# Patient Record
Sex: Male | Born: 1935 | Race: White | Hispanic: No | Marital: Married | Smoking: Never smoker
Health system: Southern US, Community
[De-identification: ages and names within clinical notes are randomized; demographics above are authoritative.]

## PROBLEM LIST (undated history)

## (undated) DIAGNOSIS — R269 Unspecified abnormalities of gait and mobility: Secondary | ICD-10-CM

## (undated) DIAGNOSIS — R413 Other amnesia: Secondary | ICD-10-CM

## (undated) DIAGNOSIS — Z9181 History of falling: Secondary | ICD-10-CM

## (undated) DIAGNOSIS — M810 Age-related osteoporosis without current pathological fracture: Secondary | ICD-10-CM

## (undated) DIAGNOSIS — F32A Depression, unspecified: Secondary | ICD-10-CM

## (undated) DIAGNOSIS — I10 Essential (primary) hypertension: Secondary | ICD-10-CM

## (undated) DIAGNOSIS — R42 Dizziness and giddiness: Secondary | ICD-10-CM

## (undated) DIAGNOSIS — G20 Parkinson's disease: Secondary | ICD-10-CM

## (undated) DIAGNOSIS — S0083XA Contusion of other part of head, initial encounter: Secondary | ICD-10-CM

## (undated) DIAGNOSIS — K5909 Other constipation: Secondary | ICD-10-CM

## (undated) DIAGNOSIS — I951 Orthostatic hypotension: Secondary | ICD-10-CM

## (undated) DIAGNOSIS — G20A1 Parkinson's disease without dyskinesia, without mention of fluctuations: Secondary | ICD-10-CM

## (undated) DIAGNOSIS — S2239XA Fracture of one rib, unspecified side, initial encounter for closed fracture: Secondary | ICD-10-CM

## (undated) DIAGNOSIS — F329 Major depressive disorder, single episode, unspecified: Secondary | ICD-10-CM

## (undated) DIAGNOSIS — N400 Enlarged prostate without lower urinary tract symptoms: Secondary | ICD-10-CM

## (undated) HISTORY — DX: Major depressive disorder, single episode, unspecified: F32.9

## (undated) HISTORY — DX: Other amnesia: R41.3

## (undated) HISTORY — DX: Unspecified abnormalities of gait and mobility: R26.9

## (undated) HISTORY — DX: Orthostatic hypotension: I95.1

## (undated) HISTORY — DX: Essential (primary) hypertension: I10

## (undated) HISTORY — PX: HERNIA REPAIR: SHX51

## (undated) HISTORY — DX: Other constipation: K59.09

## (undated) HISTORY — DX: Benign prostatic hyperplasia without lower urinary tract symptoms: N40.0

## (undated) HISTORY — DX: Depression, unspecified: F32.A

## (undated) HISTORY — DX: Fracture of one rib, unspecified side, initial encounter for closed fracture: S22.39XA

## (undated) HISTORY — PX: CATARACT EXTRACTION: SUR2

## (undated) HISTORY — PX: APPENDECTOMY: SHX54

## (undated) HISTORY — PX: TONSILLECTOMY: SUR1361

## (undated) HISTORY — DX: History of falling: Z91.81

## (undated) HISTORY — DX: Contusion of other part of head, initial encounter: S00.83XA

---

## 1999-02-23 ENCOUNTER — Encounter: Payer: Self-pay | Admitting: Otolaryngology

## 1999-02-23 ENCOUNTER — Encounter: Admission: RE | Admit: 1999-02-23 | Discharge: 1999-02-23 | Payer: Self-pay | Admitting: Otolaryngology

## 2000-10-23 ENCOUNTER — Ambulatory Visit (HOSPITAL_COMMUNITY): Admission: RE | Admit: 2000-10-23 | Discharge: 2000-10-23 | Payer: Self-pay | Admitting: Gastroenterology

## 2007-06-25 ENCOUNTER — Encounter: Admission: RE | Admit: 2007-06-25 | Discharge: 2007-07-19 | Payer: Self-pay | Admitting: Neurology

## 2010-02-01 ENCOUNTER — Encounter
Admission: RE | Admit: 2010-02-01 | Discharge: 2010-02-08 | Payer: Self-pay | Source: Home / Self Care | Attending: Neurology | Admitting: Neurology

## 2010-02-10 ENCOUNTER — Ambulatory Visit: Payer: Self-pay | Admitting: Physical Therapy

## 2010-02-11 ENCOUNTER — Ambulatory Visit: Payer: Medicare Other | Attending: Neurology | Admitting: Physical Therapy

## 2010-02-11 DIAGNOSIS — R269 Unspecified abnormalities of gait and mobility: Secondary | ICD-10-CM | POA: Insufficient documentation

## 2010-02-11 DIAGNOSIS — G20A1 Parkinson's disease without dyskinesia, without mention of fluctuations: Secondary | ICD-10-CM | POA: Insufficient documentation

## 2010-02-11 DIAGNOSIS — IMO0001 Reserved for inherently not codable concepts without codable children: Secondary | ICD-10-CM | POA: Insufficient documentation

## 2010-02-11 DIAGNOSIS — G20 Parkinson's disease: Secondary | ICD-10-CM | POA: Insufficient documentation

## 2010-02-14 ENCOUNTER — Ambulatory Visit: Payer: Medicare Other | Admitting: Physical Therapy

## 2010-02-16 ENCOUNTER — Ambulatory Visit: Payer: Medicare Other | Admitting: Physical Therapy

## 2010-02-18 ENCOUNTER — Ambulatory Visit: Payer: Medicare Other | Admitting: Physical Therapy

## 2010-02-22 ENCOUNTER — Ambulatory Visit: Payer: Medicare Other | Admitting: Physical Therapy

## 2010-02-23 ENCOUNTER — Ambulatory Visit: Payer: Medicare Other | Admitting: Physical Therapy

## 2010-02-24 ENCOUNTER — Ambulatory Visit: Payer: Self-pay | Admitting: Physical Therapy

## 2010-02-25 ENCOUNTER — Ambulatory Visit: Payer: Medicare Other | Admitting: Physical Therapy

## 2010-03-01 ENCOUNTER — Ambulatory Visit: Payer: Medicare Other | Admitting: Physical Therapy

## 2010-03-03 ENCOUNTER — Ambulatory Visit: Payer: Medicare Other | Admitting: Physical Therapy

## 2010-03-04 ENCOUNTER — Ambulatory Visit: Payer: Self-pay | Admitting: Physical Therapy

## 2010-03-07 ENCOUNTER — Ambulatory Visit: Payer: Medicare Other | Admitting: Physical Therapy

## 2010-03-10 ENCOUNTER — Ambulatory Visit: Payer: BC Managed Care – PPO | Admitting: Physical Therapy

## 2010-03-11 ENCOUNTER — Ambulatory Visit: Payer: BC Managed Care – PPO | Admitting: Physical Therapy

## 2010-03-15 ENCOUNTER — Ambulatory Visit: Payer: Medicare Other | Attending: Neurology | Admitting: Physical Therapy

## 2010-03-15 DIAGNOSIS — R269 Unspecified abnormalities of gait and mobility: Secondary | ICD-10-CM | POA: Insufficient documentation

## 2010-03-15 DIAGNOSIS — G20A1 Parkinson's disease without dyskinesia, without mention of fluctuations: Secondary | ICD-10-CM | POA: Insufficient documentation

## 2010-03-15 DIAGNOSIS — IMO0001 Reserved for inherently not codable concepts without codable children: Secondary | ICD-10-CM | POA: Insufficient documentation

## 2010-03-15 DIAGNOSIS — G20 Parkinson's disease: Secondary | ICD-10-CM | POA: Insufficient documentation

## 2010-03-17 ENCOUNTER — Ambulatory Visit: Payer: Medicare Other | Admitting: Physical Therapy

## 2010-03-23 ENCOUNTER — Ambulatory Visit: Payer: Medicare Other | Admitting: Physical Therapy

## 2010-03-25 ENCOUNTER — Ambulatory Visit: Payer: Medicare Other | Admitting: Physical Therapy

## 2010-03-29 ENCOUNTER — Ambulatory Visit: Payer: Medicare Other | Admitting: Physical Therapy

## 2010-03-31 ENCOUNTER — Ambulatory Visit: Payer: BC Managed Care – PPO | Admitting: Physical Therapy

## 2010-04-01 ENCOUNTER — Ambulatory Visit: Payer: Medicare Other | Admitting: Physical Therapy

## 2010-04-05 ENCOUNTER — Ambulatory Visit: Payer: BC Managed Care – PPO | Admitting: Physical Therapy

## 2010-04-07 ENCOUNTER — Ambulatory Visit: Payer: BC Managed Care – PPO | Admitting: Physical Therapy

## 2011-01-19 DIAGNOSIS — J309 Allergic rhinitis, unspecified: Secondary | ICD-10-CM | POA: Diagnosis not present

## 2011-01-26 DIAGNOSIS — J309 Allergic rhinitis, unspecified: Secondary | ICD-10-CM | POA: Diagnosis not present

## 2011-01-30 DIAGNOSIS — J309 Allergic rhinitis, unspecified: Secondary | ICD-10-CM | POA: Diagnosis not present

## 2011-02-02 DIAGNOSIS — R269 Unspecified abnormalities of gait and mobility: Secondary | ICD-10-CM | POA: Diagnosis not present

## 2011-02-02 DIAGNOSIS — I951 Orthostatic hypotension: Secondary | ICD-10-CM | POA: Diagnosis not present

## 2011-02-02 DIAGNOSIS — R42 Dizziness and giddiness: Secondary | ICD-10-CM | POA: Diagnosis not present

## 2011-02-02 DIAGNOSIS — G20 Parkinson's disease: Secondary | ICD-10-CM | POA: Diagnosis not present

## 2011-02-16 DIAGNOSIS — H259 Unspecified age-related cataract: Secondary | ICD-10-CM | POA: Diagnosis not present

## 2011-02-23 DIAGNOSIS — M542 Cervicalgia: Secondary | ICD-10-CM | POA: Diagnosis not present

## 2011-02-23 DIAGNOSIS — M9981 Other biomechanical lesions of cervical region: Secondary | ICD-10-CM | POA: Diagnosis not present

## 2011-02-23 DIAGNOSIS — M5030 Other cervical disc degeneration, unspecified cervical region: Secondary | ICD-10-CM | POA: Diagnosis not present

## 2011-02-23 DIAGNOSIS — M62838 Other muscle spasm: Secondary | ICD-10-CM | POA: Diagnosis not present

## 2011-02-24 DIAGNOSIS — J309 Allergic rhinitis, unspecified: Secondary | ICD-10-CM | POA: Diagnosis not present

## 2011-03-03 DIAGNOSIS — J309 Allergic rhinitis, unspecified: Secondary | ICD-10-CM | POA: Diagnosis not present

## 2011-03-06 DIAGNOSIS — J309 Allergic rhinitis, unspecified: Secondary | ICD-10-CM | POA: Diagnosis not present

## 2011-03-15 DIAGNOSIS — J309 Allergic rhinitis, unspecified: Secondary | ICD-10-CM | POA: Diagnosis not present

## 2011-03-27 DIAGNOSIS — N401 Enlarged prostate with lower urinary tract symptoms: Secondary | ICD-10-CM | POA: Diagnosis not present

## 2011-03-27 DIAGNOSIS — R351 Nocturia: Secondary | ICD-10-CM | POA: Diagnosis not present

## 2011-03-27 DIAGNOSIS — N529 Male erectile dysfunction, unspecified: Secondary | ICD-10-CM | POA: Diagnosis not present

## 2011-03-31 DIAGNOSIS — J309 Allergic rhinitis, unspecified: Secondary | ICD-10-CM | POA: Diagnosis not present

## 2011-04-06 DIAGNOSIS — M5030 Other cervical disc degeneration, unspecified cervical region: Secondary | ICD-10-CM | POA: Diagnosis not present

## 2011-04-06 DIAGNOSIS — M542 Cervicalgia: Secondary | ICD-10-CM | POA: Diagnosis not present

## 2011-04-06 DIAGNOSIS — M62838 Other muscle spasm: Secondary | ICD-10-CM | POA: Diagnosis not present

## 2011-04-06 DIAGNOSIS — M9981 Other biomechanical lesions of cervical region: Secondary | ICD-10-CM | POA: Diagnosis not present

## 2011-04-13 DIAGNOSIS — J309 Allergic rhinitis, unspecified: Secondary | ICD-10-CM | POA: Diagnosis not present

## 2011-04-20 DIAGNOSIS — M779 Enthesopathy, unspecified: Secondary | ICD-10-CM | POA: Diagnosis not present

## 2011-04-20 DIAGNOSIS — M216X9 Other acquired deformities of unspecified foot: Secondary | ICD-10-CM | POA: Diagnosis not present

## 2011-04-20 DIAGNOSIS — J309 Allergic rhinitis, unspecified: Secondary | ICD-10-CM | POA: Diagnosis not present

## 2011-04-28 DIAGNOSIS — J309 Allergic rhinitis, unspecified: Secondary | ICD-10-CM | POA: Diagnosis not present

## 2011-04-28 DIAGNOSIS — J301 Allergic rhinitis due to pollen: Secondary | ICD-10-CM | POA: Diagnosis not present

## 2011-04-28 DIAGNOSIS — J45909 Unspecified asthma, uncomplicated: Secondary | ICD-10-CM | POA: Diagnosis not present

## 2011-04-28 DIAGNOSIS — J3089 Other allergic rhinitis: Secondary | ICD-10-CM | POA: Diagnosis not present

## 2011-05-02 DIAGNOSIS — R269 Unspecified abnormalities of gait and mobility: Secondary | ICD-10-CM | POA: Diagnosis not present

## 2011-05-02 DIAGNOSIS — G20 Parkinson's disease: Secondary | ICD-10-CM | POA: Diagnosis not present

## 2011-05-03 DIAGNOSIS — N401 Enlarged prostate with lower urinary tract symptoms: Secondary | ICD-10-CM | POA: Diagnosis not present

## 2011-05-03 DIAGNOSIS — R351 Nocturia: Secondary | ICD-10-CM | POA: Diagnosis not present

## 2011-05-05 DIAGNOSIS — J309 Allergic rhinitis, unspecified: Secondary | ICD-10-CM | POA: Diagnosis not present

## 2011-05-09 DIAGNOSIS — C44320 Squamous cell carcinoma of skin of unspecified parts of face: Secondary | ICD-10-CM | POA: Diagnosis not present

## 2011-05-09 DIAGNOSIS — L570 Actinic keratosis: Secondary | ICD-10-CM | POA: Diagnosis not present

## 2011-05-25 DIAGNOSIS — J309 Allergic rhinitis, unspecified: Secondary | ICD-10-CM | POA: Diagnosis not present

## 2011-06-09 DIAGNOSIS — J309 Allergic rhinitis, unspecified: Secondary | ICD-10-CM | POA: Diagnosis not present

## 2011-06-15 DIAGNOSIS — M62838 Other muscle spasm: Secondary | ICD-10-CM | POA: Diagnosis not present

## 2011-06-15 DIAGNOSIS — M5030 Other cervical disc degeneration, unspecified cervical region: Secondary | ICD-10-CM | POA: Diagnosis not present

## 2011-06-15 DIAGNOSIS — M542 Cervicalgia: Secondary | ICD-10-CM | POA: Diagnosis not present

## 2011-06-15 DIAGNOSIS — M9981 Other biomechanical lesions of cervical region: Secondary | ICD-10-CM | POA: Diagnosis not present

## 2011-06-23 DIAGNOSIS — J309 Allergic rhinitis, unspecified: Secondary | ICD-10-CM | POA: Diagnosis not present

## 2011-06-27 DIAGNOSIS — M949 Disorder of cartilage, unspecified: Secondary | ICD-10-CM | POA: Diagnosis not present

## 2011-06-27 DIAGNOSIS — Z79899 Other long term (current) drug therapy: Secondary | ICD-10-CM | POA: Diagnosis not present

## 2011-06-27 DIAGNOSIS — Z1331 Encounter for screening for depression: Secondary | ICD-10-CM | POA: Diagnosis not present

## 2011-06-27 DIAGNOSIS — M899 Disorder of bone, unspecified: Secondary | ICD-10-CM | POA: Diagnosis not present

## 2011-06-27 DIAGNOSIS — Z0000 Encounter for general adult medical examination without abnormal findings: Secondary | ICD-10-CM | POA: Diagnosis not present

## 2011-06-27 DIAGNOSIS — I10 Essential (primary) hypertension: Secondary | ICD-10-CM | POA: Diagnosis not present

## 2011-07-03 DIAGNOSIS — L84 Corns and callosities: Secondary | ICD-10-CM | POA: Diagnosis not present

## 2011-07-10 DIAGNOSIS — J309 Allergic rhinitis, unspecified: Secondary | ICD-10-CM | POA: Diagnosis not present

## 2011-07-18 DIAGNOSIS — M949 Disorder of cartilage, unspecified: Secondary | ICD-10-CM | POA: Diagnosis not present

## 2011-07-18 DIAGNOSIS — M899 Disorder of bone, unspecified: Secondary | ICD-10-CM | POA: Diagnosis not present

## 2011-07-21 DIAGNOSIS — J309 Allergic rhinitis, unspecified: Secondary | ICD-10-CM | POA: Diagnosis not present

## 2011-08-01 DIAGNOSIS — R42 Dizziness and giddiness: Secondary | ICD-10-CM | POA: Diagnosis not present

## 2011-08-01 DIAGNOSIS — R269 Unspecified abnormalities of gait and mobility: Secondary | ICD-10-CM | POA: Diagnosis not present

## 2011-08-01 DIAGNOSIS — G20 Parkinson's disease: Secondary | ICD-10-CM | POA: Diagnosis not present

## 2011-08-02 DIAGNOSIS — J309 Allergic rhinitis, unspecified: Secondary | ICD-10-CM | POA: Diagnosis not present

## 2011-08-03 DIAGNOSIS — M542 Cervicalgia: Secondary | ICD-10-CM | POA: Diagnosis not present

## 2011-08-03 DIAGNOSIS — M9981 Other biomechanical lesions of cervical region: Secondary | ICD-10-CM | POA: Diagnosis not present

## 2011-08-03 DIAGNOSIS — M5030 Other cervical disc degeneration, unspecified cervical region: Secondary | ICD-10-CM | POA: Diagnosis not present

## 2011-08-03 DIAGNOSIS — M62838 Other muscle spasm: Secondary | ICD-10-CM | POA: Diagnosis not present

## 2011-08-10 ENCOUNTER — Ambulatory Visit: Payer: BC Managed Care – PPO | Admitting: Physical Therapy

## 2011-08-11 DIAGNOSIS — J309 Allergic rhinitis, unspecified: Secondary | ICD-10-CM | POA: Diagnosis not present

## 2011-08-17 ENCOUNTER — Ambulatory Visit: Payer: Medicare Other | Attending: Neurology | Admitting: Physical Therapy

## 2011-08-17 DIAGNOSIS — R269 Unspecified abnormalities of gait and mobility: Secondary | ICD-10-CM | POA: Insufficient documentation

## 2011-08-17 DIAGNOSIS — G20 Parkinson's disease: Secondary | ICD-10-CM | POA: Insufficient documentation

## 2011-08-17 DIAGNOSIS — IMO0001 Reserved for inherently not codable concepts without codable children: Secondary | ICD-10-CM | POA: Insufficient documentation

## 2011-08-17 DIAGNOSIS — G20A1 Parkinson's disease without dyskinesia, without mention of fluctuations: Secondary | ICD-10-CM | POA: Insufficient documentation

## 2011-08-21 DIAGNOSIS — H532 Diplopia: Secondary | ICD-10-CM | POA: Diagnosis not present

## 2011-08-24 DIAGNOSIS — H532 Diplopia: Secondary | ICD-10-CM | POA: Diagnosis not present

## 2011-08-28 ENCOUNTER — Ambulatory Visit: Payer: Medicare Other | Admitting: Physical Therapy

## 2011-08-30 ENCOUNTER — Ambulatory Visit: Payer: Medicare Other | Admitting: Physical Therapy

## 2011-09-04 ENCOUNTER — Ambulatory Visit: Payer: Medicare Other | Admitting: Physical Therapy

## 2011-09-04 DIAGNOSIS — J309 Allergic rhinitis, unspecified: Secondary | ICD-10-CM | POA: Diagnosis not present

## 2011-09-06 ENCOUNTER — Ambulatory Visit: Payer: Medicare Other | Admitting: Physical Therapy

## 2011-09-12 ENCOUNTER — Ambulatory Visit: Payer: Medicare Other | Attending: Neurology | Admitting: Physical Therapy

## 2011-09-12 DIAGNOSIS — R269 Unspecified abnormalities of gait and mobility: Secondary | ICD-10-CM | POA: Insufficient documentation

## 2011-09-12 DIAGNOSIS — IMO0001 Reserved for inherently not codable concepts without codable children: Secondary | ICD-10-CM | POA: Diagnosis not present

## 2011-09-12 DIAGNOSIS — G20A1 Parkinson's disease without dyskinesia, without mention of fluctuations: Secondary | ICD-10-CM | POA: Insufficient documentation

## 2011-09-12 DIAGNOSIS — G20 Parkinson's disease: Secondary | ICD-10-CM | POA: Insufficient documentation

## 2011-09-14 ENCOUNTER — Ambulatory Visit: Payer: Medicare Other | Admitting: Physical Therapy

## 2011-09-18 ENCOUNTER — Ambulatory Visit: Payer: Medicare Other | Admitting: Physical Therapy

## 2011-09-20 ENCOUNTER — Ambulatory Visit: Payer: Medicare Other | Admitting: Physical Therapy

## 2011-09-20 DIAGNOSIS — J309 Allergic rhinitis, unspecified: Secondary | ICD-10-CM | POA: Diagnosis not present

## 2011-10-03 DIAGNOSIS — H532 Diplopia: Secondary | ICD-10-CM | POA: Diagnosis not present

## 2011-10-05 ENCOUNTER — Ambulatory Visit: Payer: Medicare Other | Admitting: Physical Therapy

## 2011-10-05 DIAGNOSIS — J309 Allergic rhinitis, unspecified: Secondary | ICD-10-CM | POA: Diagnosis not present

## 2011-10-11 ENCOUNTER — Ambulatory Visit: Payer: Medicare Other | Attending: Neurology | Admitting: Physical Therapy

## 2011-10-11 DIAGNOSIS — R269 Unspecified abnormalities of gait and mobility: Secondary | ICD-10-CM | POA: Insufficient documentation

## 2011-10-11 DIAGNOSIS — G20 Parkinson's disease: Secondary | ICD-10-CM | POA: Insufficient documentation

## 2011-10-11 DIAGNOSIS — IMO0001 Reserved for inherently not codable concepts without codable children: Secondary | ICD-10-CM | POA: Diagnosis not present

## 2011-10-11 DIAGNOSIS — G20A1 Parkinson's disease without dyskinesia, without mention of fluctuations: Secondary | ICD-10-CM | POA: Insufficient documentation

## 2011-10-11 DIAGNOSIS — J309 Allergic rhinitis, unspecified: Secondary | ICD-10-CM | POA: Diagnosis not present

## 2011-10-13 ENCOUNTER — Ambulatory Visit: Payer: BC Managed Care – PPO | Admitting: Physical Therapy

## 2011-10-16 ENCOUNTER — Ambulatory Visit: Payer: Medicare Other | Admitting: Physical Therapy

## 2011-10-16 DIAGNOSIS — G20 Parkinson's disease: Secondary | ICD-10-CM | POA: Diagnosis not present

## 2011-10-16 DIAGNOSIS — IMO0001 Reserved for inherently not codable concepts without codable children: Secondary | ICD-10-CM | POA: Diagnosis not present

## 2011-10-16 DIAGNOSIS — R269 Unspecified abnormalities of gait and mobility: Secondary | ICD-10-CM | POA: Diagnosis not present

## 2011-10-18 ENCOUNTER — Ambulatory Visit: Payer: Medicare Other | Admitting: Physical Therapy

## 2011-10-18 DIAGNOSIS — IMO0001 Reserved for inherently not codable concepts without codable children: Secondary | ICD-10-CM | POA: Diagnosis not present

## 2011-10-18 DIAGNOSIS — G20 Parkinson's disease: Secondary | ICD-10-CM | POA: Diagnosis not present

## 2011-10-18 DIAGNOSIS — R269 Unspecified abnormalities of gait and mobility: Secondary | ICD-10-CM | POA: Diagnosis not present

## 2011-10-23 ENCOUNTER — Ambulatory Visit: Payer: Medicare Other | Admitting: Physical Therapy

## 2011-10-23 DIAGNOSIS — IMO0001 Reserved for inherently not codable concepts without codable children: Secondary | ICD-10-CM | POA: Diagnosis not present

## 2011-10-23 DIAGNOSIS — G20 Parkinson's disease: Secondary | ICD-10-CM | POA: Diagnosis not present

## 2011-10-23 DIAGNOSIS — R269 Unspecified abnormalities of gait and mobility: Secondary | ICD-10-CM | POA: Diagnosis not present

## 2011-10-25 ENCOUNTER — Ambulatory Visit: Payer: Medicare Other | Admitting: Physical Therapy

## 2011-10-25 DIAGNOSIS — R269 Unspecified abnormalities of gait and mobility: Secondary | ICD-10-CM | POA: Diagnosis not present

## 2011-10-25 DIAGNOSIS — J309 Allergic rhinitis, unspecified: Secondary | ICD-10-CM | POA: Diagnosis not present

## 2011-10-25 DIAGNOSIS — G20 Parkinson's disease: Secondary | ICD-10-CM | POA: Diagnosis not present

## 2011-10-25 DIAGNOSIS — IMO0001 Reserved for inherently not codable concepts without codable children: Secondary | ICD-10-CM | POA: Diagnosis not present

## 2011-10-30 ENCOUNTER — Ambulatory Visit: Payer: Medicare Other | Admitting: Physical Therapy

## 2011-10-30 DIAGNOSIS — IMO0001 Reserved for inherently not codable concepts without codable children: Secondary | ICD-10-CM | POA: Diagnosis not present

## 2011-10-30 DIAGNOSIS — R269 Unspecified abnormalities of gait and mobility: Secondary | ICD-10-CM | POA: Diagnosis not present

## 2011-10-30 DIAGNOSIS — G20 Parkinson's disease: Secondary | ICD-10-CM | POA: Diagnosis not present

## 2011-11-02 ENCOUNTER — Ambulatory Visit: Payer: Medicare Other | Admitting: Physical Therapy

## 2011-11-02 DIAGNOSIS — R269 Unspecified abnormalities of gait and mobility: Secondary | ICD-10-CM | POA: Diagnosis not present

## 2011-11-02 DIAGNOSIS — IMO0001 Reserved for inherently not codable concepts without codable children: Secondary | ICD-10-CM | POA: Diagnosis not present

## 2011-11-02 DIAGNOSIS — G20 Parkinson's disease: Secondary | ICD-10-CM | POA: Diagnosis not present

## 2011-11-03 DIAGNOSIS — Z23 Encounter for immunization: Secondary | ICD-10-CM | POA: Diagnosis not present

## 2011-11-09 DIAGNOSIS — L570 Actinic keratosis: Secondary | ICD-10-CM | POA: Diagnosis not present

## 2011-11-09 DIAGNOSIS — D485 Neoplasm of uncertain behavior of skin: Secondary | ICD-10-CM | POA: Diagnosis not present

## 2011-11-09 DIAGNOSIS — L821 Other seborrheic keratosis: Secondary | ICD-10-CM | POA: Diagnosis not present

## 2011-11-09 DIAGNOSIS — J309 Allergic rhinitis, unspecified: Secondary | ICD-10-CM | POA: Diagnosis not present

## 2011-11-16 DIAGNOSIS — G20 Parkinson's disease: Secondary | ICD-10-CM | POA: Diagnosis not present

## 2011-11-16 DIAGNOSIS — R269 Unspecified abnormalities of gait and mobility: Secondary | ICD-10-CM | POA: Diagnosis not present

## 2011-11-16 DIAGNOSIS — J309 Allergic rhinitis, unspecified: Secondary | ICD-10-CM | POA: Diagnosis not present

## 2011-11-19 ENCOUNTER — Emergency Department (HOSPITAL_COMMUNITY): Payer: Medicare Other

## 2011-11-19 ENCOUNTER — Emergency Department (HOSPITAL_COMMUNITY)
Admission: EM | Admit: 2011-11-19 | Discharge: 2011-11-19 | Disposition: A | Discharge status: Home / Self Care | Payer: Medicare Other | Attending: Emergency Medicine | Admitting: Emergency Medicine

## 2011-11-19 ENCOUNTER — Encounter (HOSPITAL_COMMUNITY): Payer: Self-pay | Admitting: Emergency Medicine

## 2011-11-19 DIAGNOSIS — Z9181 History of falling: Secondary | ICD-10-CM | POA: Diagnosis not present

## 2011-11-19 DIAGNOSIS — Y92009 Unspecified place in unspecified non-institutional (private) residence as the place of occurrence of the external cause: Secondary | ICD-10-CM | POA: Insufficient documentation

## 2011-11-19 DIAGNOSIS — R109 Unspecified abdominal pain: Secondary | ICD-10-CM | POA: Insufficient documentation

## 2011-11-19 DIAGNOSIS — M810 Age-related osteoporosis without current pathological fracture: Secondary | ICD-10-CM | POA: Insufficient documentation

## 2011-11-19 DIAGNOSIS — Y939 Activity, unspecified: Secondary | ICD-10-CM | POA: Insufficient documentation

## 2011-11-19 DIAGNOSIS — G20 Parkinson's disease: Secondary | ICD-10-CM | POA: Diagnosis not present

## 2011-11-19 DIAGNOSIS — G20A1 Parkinson's disease without dyskinesia, without mention of fluctuations: Secondary | ICD-10-CM | POA: Insufficient documentation

## 2011-11-19 DIAGNOSIS — S2239XA Fracture of one rib, unspecified side, initial encounter for closed fracture: Secondary | ICD-10-CM | POA: Diagnosis not present

## 2011-11-19 DIAGNOSIS — R6889 Other general symptoms and signs: Secondary | ICD-10-CM | POA: Diagnosis not present

## 2011-11-19 DIAGNOSIS — R296 Repeated falls: Secondary | ICD-10-CM | POA: Insufficient documentation

## 2011-11-19 HISTORY — DX: Parkinson's disease without dyskinesia, without mention of fluctuations: G20.A1

## 2011-11-19 HISTORY — DX: Age-related osteoporosis without current pathological fracture: M81.0

## 2011-11-19 HISTORY — DX: Dizziness and giddiness: R42

## 2011-11-19 HISTORY — DX: Parkinson's disease: G20

## 2011-11-19 LAB — POCT I-STAT, CHEM 8
BUN: 21 mg/dL (ref 6–23)
Calcium, Ion: 1.21 mmol/L (ref 1.13–1.30)
Glucose, Bld: 90 mg/dL (ref 70–99)
TCO2: 29 mmol/L (ref 0–100)

## 2011-11-19 MED ORDER — OXYCODONE-ACETAMINOPHEN 5-325 MG PO TABS
1.0000 | ORAL_TABLET | Freq: Once | ORAL | Status: AC
  Administered 2011-11-19: 1 via ORAL
  Filled 2011-11-19: qty 1

## 2011-11-19 MED ORDER — METHOCARBAMOL 500 MG PO TABS: 500.0000 mg | ORAL_TABLET | Freq: Two times a day (BID) | ORAL | Status: DC

## 2011-11-19 MED ORDER — OXYCODONE-ACETAMINOPHEN 5-325 MG PO TABS: 1.0000 | ORAL_TABLET | ORAL | Status: DC | PRN

## 2011-11-19 NOTE — ED Provider Notes (Signed)
History     CSN: [id number]  Arrival date & time 11/19/11  0719   First MD Initiated Contact with Patient 11/19/11 [phone]      Chief Complaint  Patient presents with  . Fall    (Consider location/radiation/quality/duration/timing/severity/associated sxs/prior treatment) Patient is a 78 y.o. male presenting with fall. The history is provided by the patient.  Fall   patient here after having a fall at home when he lost his balance. No loss of consciousness. Complains of pain to his right rib cage characterized as sharp and worse with movements. Denies any hematuria dysuria. Pain does radiate to his flank. No Upper or lower extremity weakness. No medications used prior to arrival and patient arrived by EMS. He does have a history of Parkinson's disease and does have frequent falls  Past Medical History  Diagnosis Date  . Parkinson disease   . Osteoporosis   . Dizziness     History reviewed. No pertinent past surgical history.  No family history on file.  History  Substance Use Topics  . Smoking status: Not on file  . Smokeless tobacco: Not on file  . Alcohol Use:       Review of Systems  All other systems reviewed and are negative.    Allergies  Stalevo  Home Medications  No current outpatient prescriptions on file.  BP 219/81  Pulse 64  Temp 97.6 F (36.4 C) (Oral)  Resp 18  SpO2 100%  Physical Exam  Nursing note and vitals reviewed. Constitutional: He is oriented to person, place, and time. He appears well-developed and well-nourished.  Non-toxic appearance. No distress.  HENT:  Head: Normocephalic and atraumatic.  Eyes: Conjunctivae normal, EOM and lids are normal. Pupils are equal, round, and reactive to light.  Neck: Normal range of motion. Neck supple. No tracheal deviation present. No mass present.  Cardiovascular: Normal rate, regular rhythm and normal heart sounds.  Exam reveals no gallop.   No murmur heard. Pulmonary/Chest: Effort normal and  breath sounds normal. No stridor. No respiratory distress. He has no decreased breath sounds. He has no wheezes. He has no rhonchi. He has no rales. He exhibits tenderness. He exhibits no crepitus and no deformity.    Abdominal: Soft. Normal appearance and bowel sounds are normal. He exhibits no distension. There is no tenderness. There is no rebound and no CVA tenderness.  Musculoskeletal: Normal range of motion. He exhibits no edema and no tenderness.  Neurological: He is alert and oriented to person, place, and time. He has normal strength. No cranial nerve deficit or sensory deficit. GCS eye subscore is 4. GCS verbal subscore is 5. GCS motor subscore is 6.  Skin: Skin is warm and dry. No abrasion and no rash noted.  Psychiatric: He has a normal mood and affect. His speech is normal and behavior is normal.    ED Course  Procedures (including critical care time)  Labs Reviewed - No data to display No results found.   No diagnosis found.    MDM  The patient's x-ray shows nondisplaced rib fracture. Given pain medication and blood pressure repeated and is stable.        Toy Baker, MD 11/19/11 [phone]

## 2011-11-19 NOTE — ED Notes (Signed)
Per EMS-pt fell over bath tub, right side pain 10/10.

## 2011-11-22 DIAGNOSIS — S2239XA Fracture of one rib, unspecified side, initial encounter for closed fracture: Secondary | ICD-10-CM | POA: Diagnosis not present

## 2011-11-22 DIAGNOSIS — I10 Essential (primary) hypertension: Secondary | ICD-10-CM | POA: Diagnosis not present

## 2011-12-21 DIAGNOSIS — L570 Actinic keratosis: Secondary | ICD-10-CM | POA: Diagnosis not present

## 2011-12-21 DIAGNOSIS — C44621 Squamous cell carcinoma of skin of unspecified upper limb, including shoulder: Secondary | ICD-10-CM | POA: Diagnosis not present

## 2011-12-21 DIAGNOSIS — J309 Allergic rhinitis, unspecified: Secondary | ICD-10-CM | POA: Diagnosis not present

## 2011-12-21 DIAGNOSIS — D485 Neoplasm of uncertain behavior of skin: Secondary | ICD-10-CM | POA: Diagnosis not present

## 2011-12-26 DIAGNOSIS — G20 Parkinson's disease: Secondary | ICD-10-CM | POA: Diagnosis not present

## 2011-12-26 DIAGNOSIS — I10 Essential (primary) hypertension: Secondary | ICD-10-CM | POA: Diagnosis not present

## 2012-01-12 DIAGNOSIS — J309 Allergic rhinitis, unspecified: Secondary | ICD-10-CM | POA: Diagnosis not present

## 2012-01-25 DIAGNOSIS — J309 Allergic rhinitis, unspecified: Secondary | ICD-10-CM | POA: Diagnosis not present

## 2012-02-15 DIAGNOSIS — G20A1 Parkinson's disease without dyskinesia, without mention of fluctuations: Secondary | ICD-10-CM | POA: Insufficient documentation

## 2012-02-15 DIAGNOSIS — R42 Dizziness and giddiness: Secondary | ICD-10-CM | POA: Diagnosis not present

## 2012-02-15 DIAGNOSIS — I951 Orthostatic hypotension: Secondary | ICD-10-CM | POA: Insufficient documentation

## 2012-02-15 DIAGNOSIS — G20 Parkinson's disease: Secondary | ICD-10-CM | POA: Diagnosis not present

## 2012-02-15 DIAGNOSIS — J309 Allergic rhinitis, unspecified: Secondary | ICD-10-CM | POA: Diagnosis not present

## 2012-02-15 DIAGNOSIS — R3915 Urgency of urination: Secondary | ICD-10-CM | POA: Insufficient documentation

## 2012-02-15 DIAGNOSIS — R269 Unspecified abnormalities of gait and mobility: Secondary | ICD-10-CM | POA: Insufficient documentation

## 2012-02-26 DIAGNOSIS — J309 Allergic rhinitis, unspecified: Secondary | ICD-10-CM | POA: Diagnosis not present

## 2012-03-06 DIAGNOSIS — J309 Allergic rhinitis, unspecified: Secondary | ICD-10-CM | POA: Diagnosis not present

## 2012-03-13 DIAGNOSIS — H532 Diplopia: Secondary | ICD-10-CM | POA: Diagnosis not present

## 2012-03-20 DIAGNOSIS — J309 Allergic rhinitis, unspecified: Secondary | ICD-10-CM | POA: Diagnosis not present

## 2012-04-02 DIAGNOSIS — S93409A Sprain of unspecified ligament of unspecified ankle, initial encounter: Secondary | ICD-10-CM | POA: Diagnosis not present

## 2012-04-08 DIAGNOSIS — N401 Enlarged prostate with lower urinary tract symptoms: Secondary | ICD-10-CM | POA: Diagnosis not present

## 2012-04-08 DIAGNOSIS — R351 Nocturia: Secondary | ICD-10-CM | POA: Diagnosis not present

## 2012-04-08 DIAGNOSIS — N529 Male erectile dysfunction, unspecified: Secondary | ICD-10-CM | POA: Diagnosis not present

## 2012-04-16 DIAGNOSIS — S7000XA Contusion of unspecified hip, initial encounter: Secondary | ICD-10-CM | POA: Diagnosis not present

## 2012-04-16 DIAGNOSIS — J309 Allergic rhinitis, unspecified: Secondary | ICD-10-CM | POA: Diagnosis not present

## 2012-05-01 ENCOUNTER — Telehealth: Payer: Self-pay

## 2012-05-01 DIAGNOSIS — J45909 Unspecified asthma, uncomplicated: Secondary | ICD-10-CM | POA: Diagnosis not present

## 2012-05-01 DIAGNOSIS — J301 Allergic rhinitis due to pollen: Secondary | ICD-10-CM | POA: Diagnosis not present

## 2012-05-01 DIAGNOSIS — J309 Allergic rhinitis, unspecified: Secondary | ICD-10-CM | POA: Diagnosis not present

## 2012-05-01 DIAGNOSIS — J3089 Other allergic rhinitis: Secondary | ICD-10-CM | POA: Diagnosis not present

## 2012-05-01 MED ORDER — CARBIDOPA-LEVODOPA 25-250 MG PO TABS: 1.0000 | ORAL_TABLET | Freq: Four times a day (QID) | ORAL | Status: DC

## 2012-05-01 MED ORDER — LINACLOTIDE 290 MCG PO CAPS: 1.0000 | ORAL_CAPSULE | Freq: Every day | ORAL | Status: DC

## 2012-05-01 MED ORDER — CARBIDOPA-LEVODOPA 25-100 MG PO TABS: 1.0000 | ORAL_TABLET | Freq: Two times a day (BID) | ORAL | Status: DC

## 2012-05-01 NOTE — Telephone Encounter (Signed)
Spouse called clinic requesting we send refills on Sinemet and Linzess to [facility] [phone]).

## 2012-05-24 DIAGNOSIS — J309 Allergic rhinitis, unspecified: Secondary | ICD-10-CM | POA: Diagnosis not present

## 2012-05-29 ENCOUNTER — Other Ambulatory Visit: Payer: Self-pay

## 2012-05-29 MED ORDER — PRAMIPEXOLE DIHYDROCHLORIDE 1.5 MG PO TABS: 1.5000 mg | ORAL_TABLET | Freq: Three times a day (TID) | ORAL | Status: DC

## 2012-05-29 NOTE — Telephone Encounter (Signed)
Patient called requesting refill on Mirapex be sent to [facility] because he is waiting on the mail order to send his Rx.

## 2012-06-06 ENCOUNTER — Ambulatory Visit (INDEPENDENT_AMBULATORY_CARE_PROVIDER_SITE_OTHER): Payer: Medicare Other | Admitting: Neurology

## 2012-06-06 ENCOUNTER — Encounter: Payer: Self-pay | Admitting: Neurology

## 2012-06-06 VITALS — BP 130/64 | HR 59 | Ht 71.5 in | Wt 158.0 lb

## 2012-06-06 DIAGNOSIS — G20 Parkinson's disease: Secondary | ICD-10-CM

## 2012-06-06 DIAGNOSIS — R269 Unspecified abnormalities of gait and mobility: Secondary | ICD-10-CM

## 2012-06-06 DIAGNOSIS — R42 Dizziness and giddiness: Secondary | ICD-10-CM

## 2012-06-06 DIAGNOSIS — J309 Allergic rhinitis, unspecified: Secondary | ICD-10-CM | POA: Diagnosis not present

## 2012-06-06 DIAGNOSIS — I951 Orthostatic hypotension: Secondary | ICD-10-CM

## 2012-06-06 DIAGNOSIS — R3915 Urgency of urination: Secondary | ICD-10-CM

## 2012-06-06 MED ORDER — SELEGILINE HCL 5 MG PO CAPS: 5.0000 mg | ORAL_CAPSULE | Freq: Two times a day (BID) | ORAL | Status: DC

## 2012-06-06 NOTE — Progress Notes (Signed)
Reason for visit: Parkinson's disease  Brian Aguirre is an 79 y.o. male  History of present illness:  Brian Aguirre is an 79 year old right-handed white male with a history of Parkinson's disease. The patient has remained fairly active, and he is involved with Parkinson's movement classes through [employer]. The patient has not had any recent falls. The patient uses a cane for ambulation. The patient is frequently freezing, and he has learned to sway back-and-forth before he tries to walk, and this will help initiate his ambulation. The patient has a prisms in his glasses to help his double vision, but this remains an issue. Within the last 6 months, the patient has reported some problems with word finding, and he will lose track of his thoughts in midsentence, and have trouble completing his conversation. The patient does not operate a motor vehicle. The patient has had some urinary urgency and occasional incontinence. The patient has been seen by urology, but no etiology of his problem was found, and he was not placed on medications. The patient denies any significant issues with swallowing. The patient has gained significant benefit with the Linzess with the constipation issue.  Past Medical History  Diagnosis Date  . Parkinson disease   . Osteoporosis   . Dizziness   . Depression   . Hypertension   . Gait disturbance   . Benign enlargement of prostate   . Orthostatic hypotension   . Fracture, rib     Past Surgical History  Procedure Laterality Date  . Appendectomy    . Tonsillectomy    . Hernia repair    . Cataract extraction Left     Family History  Problem Relation Age of Onset  . Cancer Mother   . Heart failure Father     Social history:  reports that he has never smoked. He does not have any smokeless tobacco history on file. He reports that he does not drink alcohol or use illicit drugs.  Allergies:  Allergies  Allergen Reactions  . Stalevo  (Carbidopa-Levodopa-Entacapone) Diarrhea    Medications:  Current Outpatient Prescriptions on File Prior to Visit  Medication Sig Dispense Refill  . azelastine (ASTELIN) 137 MCG/SPRAY nasal spray Place 1 spray into the nose daily. Use in each nostril as directed      . carbidopa-levodopa (SINEMET IR) 25-100 MG per tablet Take 1 tablet by mouth 2 (two) times daily. At 9am and 3pm  180 tablet  3  . carbidopa-levodopa (SINEMET IR) 25-250 MG per tablet Take 1 tablet by mouth 4 (four) times daily.  360 tablet  3  . cholecalciferol (VITAMIN D) 1000 UNITS tablet Take 1,000 Units by mouth daily.      Marland Kitchen docusate sodium (COLACE) 100 MG capsule Take 100 mg by mouth 2 (two) times daily.      . fish oil-omega-3 fatty acids 1000 MG capsule Take 2 g by mouth daily.      . Linaclotide (LINZESS) 290 MCG CAPS Take 1 capsule by mouth daily.  90 capsule  3  . Multiple Vitamin (MULTIVITAMIN WITH MINERALS) TABS Take 1 tablet by mouth daily.      . pramipexole (MIRAPEX) 1.5 MG tablet Take 1 tablet (1.5 mg total) by mouth 3 (three) times daily.  90 tablet  1  . triamcinolone (NASACORT) 55 MCG/ACT nasal inhaler Place 1 spray into the nose daily.      . vitamin B-12 (CYANOCOBALAMIN) 500 MCG tablet Take 500 mcg by mouth daily.      Marland Kitchen  oxyCODONE-acetaminophen (PERCOCET/ROXICET) 5-325 MG per tablet Take 1 tablet by mouth every 4 (four) hours as needed for pain.  15 tablet  0   No current facility-administered medications on file prior to visit.    ROS:  Out of a complete 14 system review of symptoms, the patient complains only of the following symptoms, and all other reviewed systems are negative.  Fatigue Swelling in the legs, mainly on the left Double vision Shortness of breath, snoring Constipation Urination problems, impotence Easy bruising, easy bleeding Feeling hot, cold Achy muscles Allergies, skin sensitivity Numbness, weakness, dizziness, tremor Not enough sleep, decreased energy,  hallucinations   Blood pressure 130/64, pulse 59, height 5' 11.5" (1.816 m), weight 158 lb (71.668 kg).  Physical Exam  General: The patient is alert and cooperative at the time of the examination.  Skin: No significant peripheral edema is noted.   Neurologic Exam   Mental status: Mini-Mental status examination done today shows a total score of 29/30. The patient is able to name 8 animals in 60 seconds.  Cranial nerves: Facial symmetry is present. Speech is normal, no aphasia or dysarthria is noted. Extraocular movements are full. Visual fields are full.  Motor: The patient has good strength in all 4 extremities.  Coordination: The patient has good finger-nose-finger and heel-to-shin bilaterally.  Gait and station: The patient has a normal gait. Tandem gait is normal. Romberg is negative. No drift is seen.  Reflexes: Deep tendon reflexes are symmetric.   Assessment/Plan:  One. Parkinson's disease  2. Gait disturbance  3. Chronic constipation  4. Reported memory disturbance  5. Urinary urgency  The patient will be continued on his current medications. The patient does not wish to be placed on any medications for his bladder. The patient will remain active. I will try to get occupational therapy out to his condominium to help plan for bathroom modifications for wheelchair access. The patient will followup in 4 or 5 months.  [name] MD 06/06/2012 9:33 PM  [facility] [address] [place], [place] [address]  Phone [phone] Fax [phone]

## 2012-06-11 DIAGNOSIS — J309 Allergic rhinitis, unspecified: Secondary | ICD-10-CM | POA: Diagnosis not present

## 2012-06-14 DIAGNOSIS — J309 Allergic rhinitis, unspecified: Secondary | ICD-10-CM | POA: Diagnosis not present

## 2012-06-17 DIAGNOSIS — J309 Allergic rhinitis, unspecified: Secondary | ICD-10-CM | POA: Diagnosis not present

## 2012-06-24 DIAGNOSIS — J309 Allergic rhinitis, unspecified: Secondary | ICD-10-CM | POA: Diagnosis not present

## 2012-06-25 DIAGNOSIS — L570 Actinic keratosis: Secondary | ICD-10-CM | POA: Diagnosis not present

## 2012-06-25 DIAGNOSIS — Z85828 Personal history of other malignant neoplasm of skin: Secondary | ICD-10-CM | POA: Diagnosis not present

## 2012-06-28 DIAGNOSIS — R634 Abnormal weight loss: Secondary | ICD-10-CM | POA: Diagnosis not present

## 2012-06-28 DIAGNOSIS — Z79899 Other long term (current) drug therapy: Secondary | ICD-10-CM | POA: Diagnosis not present

## 2012-06-28 DIAGNOSIS — Z1331 Encounter for screening for depression: Secondary | ICD-10-CM | POA: Diagnosis not present

## 2012-06-28 DIAGNOSIS — G20 Parkinson's disease: Secondary | ICD-10-CM | POA: Diagnosis not present

## 2012-06-28 DIAGNOSIS — I10 Essential (primary) hypertension: Secondary | ICD-10-CM | POA: Diagnosis not present

## 2012-06-28 DIAGNOSIS — R269 Unspecified abnormalities of gait and mobility: Secondary | ICD-10-CM | POA: Diagnosis not present

## 2012-06-28 DIAGNOSIS — Z0000 Encounter for general adult medical examination without abnormal findings: Secondary | ICD-10-CM | POA: Diagnosis not present

## 2012-07-15 DIAGNOSIS — J309 Allergic rhinitis, unspecified: Secondary | ICD-10-CM | POA: Diagnosis not present

## 2012-07-30 ENCOUNTER — Other Ambulatory Visit: Payer: Self-pay | Admitting: Neurology

## 2012-08-01 ENCOUNTER — Other Ambulatory Visit: Payer: Self-pay

## 2012-08-01 MED ORDER — CARBIDOPA-LEVODOPA 25-250 MG PO TABS: 1.0000 | ORAL_TABLET | Freq: Four times a day (QID) | ORAL | Status: DC

## 2012-08-02 DIAGNOSIS — J309 Allergic rhinitis, unspecified: Secondary | ICD-10-CM | POA: Diagnosis not present

## 2012-08-15 DIAGNOSIS — J309 Allergic rhinitis, unspecified: Secondary | ICD-10-CM | POA: Diagnosis not present

## 2012-08-20 ENCOUNTER — Other Ambulatory Visit: Payer: Self-pay

## 2012-08-20 MED ORDER — SELEGILINE HCL 5 MG PO CAPS: 5.0000 mg | ORAL_CAPSULE | Freq: Two times a day (BID) | ORAL | Status: DC

## 2012-08-20 NOTE — Telephone Encounter (Signed)
Patient called stating he was getting Selegiline at [facility], but he would like a new Rx sent to [facility] instead.

## 2012-08-26 DIAGNOSIS — L608 Other nail disorders: Secondary | ICD-10-CM | POA: Diagnosis not present

## 2012-08-26 DIAGNOSIS — C4441 Basal cell carcinoma of skin of scalp and neck: Secondary | ICD-10-CM | POA: Diagnosis not present

## 2012-08-26 DIAGNOSIS — Z85828 Personal history of other malignant neoplasm of skin: Secondary | ICD-10-CM | POA: Diagnosis not present

## 2012-08-26 DIAGNOSIS — D485 Neoplasm of uncertain behavior of skin: Secondary | ICD-10-CM | POA: Diagnosis not present

## 2012-08-29 DIAGNOSIS — J309 Allergic rhinitis, unspecified: Secondary | ICD-10-CM | POA: Diagnosis not present

## 2012-09-24 DIAGNOSIS — J309 Allergic rhinitis, unspecified: Secondary | ICD-10-CM | POA: Diagnosis not present

## 2012-09-24 DIAGNOSIS — D485 Neoplasm of uncertain behavior of skin: Secondary | ICD-10-CM | POA: Diagnosis not present

## 2012-09-24 DIAGNOSIS — L821 Other seborrheic keratosis: Secondary | ICD-10-CM | POA: Diagnosis not present

## 2012-09-24 DIAGNOSIS — C44611 Basal cell carcinoma of skin of unspecified upper limb, including shoulder: Secondary | ICD-10-CM | POA: Diagnosis not present

## 2012-09-24 DIAGNOSIS — Z85828 Personal history of other malignant neoplasm of skin: Secondary | ICD-10-CM | POA: Diagnosis not present

## 2012-09-24 DIAGNOSIS — D239 Other benign neoplasm of skin, unspecified: Secondary | ICD-10-CM | POA: Diagnosis not present

## 2012-09-24 DIAGNOSIS — L570 Actinic keratosis: Secondary | ICD-10-CM | POA: Diagnosis not present

## 2012-10-04 DIAGNOSIS — H2510 Age-related nuclear cataract, unspecified eye: Secondary | ICD-10-CM | POA: Diagnosis not present

## 2012-10-04 DIAGNOSIS — Z961 Presence of intraocular lens: Secondary | ICD-10-CM | POA: Diagnosis not present

## 2012-10-10 DIAGNOSIS — J309 Allergic rhinitis, unspecified: Secondary | ICD-10-CM | POA: Diagnosis not present

## 2012-10-10 DIAGNOSIS — Z23 Encounter for immunization: Secondary | ICD-10-CM | POA: Diagnosis not present

## 2012-10-22 DIAGNOSIS — H259 Unspecified age-related cataract: Secondary | ICD-10-CM | POA: Diagnosis not present

## 2012-10-22 DIAGNOSIS — H532 Diplopia: Secondary | ICD-10-CM | POA: Diagnosis not present

## 2012-10-22 DIAGNOSIS — Z961 Presence of intraocular lens: Secondary | ICD-10-CM | POA: Diagnosis not present

## 2012-10-22 DIAGNOSIS — H5700 Unspecified anomaly of pupillary function: Secondary | ICD-10-CM | POA: Diagnosis not present

## 2012-10-22 DIAGNOSIS — J309 Allergic rhinitis, unspecified: Secondary | ICD-10-CM | POA: Diagnosis not present

## 2012-10-27 ENCOUNTER — Other Ambulatory Visit: Payer: Self-pay

## 2012-10-27 MED ORDER — PRAMIPEXOLE DIHYDROCHLORIDE 1.5 MG PO TABS: 1.5000 mg | ORAL_TABLET | Freq: Three times a day (TID) | ORAL | Status: DC

## 2012-11-01 ENCOUNTER — Other Ambulatory Visit: Payer: Self-pay | Admitting: Neurology

## 2012-11-04 ENCOUNTER — Other Ambulatory Visit: Payer: Self-pay | Admitting: Neurology

## 2012-11-12 DIAGNOSIS — H269 Unspecified cataract: Secondary | ICD-10-CM | POA: Diagnosis not present

## 2012-11-12 DIAGNOSIS — H2510 Age-related nuclear cataract, unspecified eye: Secondary | ICD-10-CM | POA: Diagnosis not present

## 2012-11-12 DIAGNOSIS — H52209 Unspecified astigmatism, unspecified eye: Secondary | ICD-10-CM | POA: Diagnosis not present

## 2012-11-12 DIAGNOSIS — H25039 Anterior subcapsular polar age-related cataract, unspecified eye: Secondary | ICD-10-CM | POA: Diagnosis not present

## 2012-11-12 DIAGNOSIS — H25049 Posterior subcapsular polar age-related cataract, unspecified eye: Secondary | ICD-10-CM | POA: Diagnosis not present

## 2012-11-12 DIAGNOSIS — H2181 Floppy iris syndrome: Secondary | ICD-10-CM | POA: Diagnosis not present

## 2012-11-12 DIAGNOSIS — H5700 Unspecified anomaly of pupillary function: Secondary | ICD-10-CM | POA: Diagnosis not present

## 2012-11-29 DIAGNOSIS — R269 Unspecified abnormalities of gait and mobility: Secondary | ICD-10-CM | POA: Diagnosis not present

## 2012-11-29 DIAGNOSIS — M6281 Muscle weakness (generalized): Secondary | ICD-10-CM | POA: Diagnosis not present

## 2012-11-29 DIAGNOSIS — R29818 Other symptoms and signs involving the nervous system: Secondary | ICD-10-CM | POA: Diagnosis not present

## 2012-12-02 DIAGNOSIS — R29818 Other symptoms and signs involving the nervous system: Secondary | ICD-10-CM | POA: Diagnosis not present

## 2012-12-02 DIAGNOSIS — M6281 Muscle weakness (generalized): Secondary | ICD-10-CM | POA: Diagnosis not present

## 2012-12-02 DIAGNOSIS — R269 Unspecified abnormalities of gait and mobility: Secondary | ICD-10-CM | POA: Diagnosis not present

## 2012-12-04 DIAGNOSIS — M6281 Muscle weakness (generalized): Secondary | ICD-10-CM | POA: Diagnosis not present

## 2012-12-04 DIAGNOSIS — R269 Unspecified abnormalities of gait and mobility: Secondary | ICD-10-CM | POA: Diagnosis not present

## 2012-12-04 DIAGNOSIS — R29818 Other symptoms and signs involving the nervous system: Secondary | ICD-10-CM | POA: Diagnosis not present

## 2012-12-06 DIAGNOSIS — M6281 Muscle weakness (generalized): Secondary | ICD-10-CM | POA: Diagnosis not present

## 2012-12-06 DIAGNOSIS — R269 Unspecified abnormalities of gait and mobility: Secondary | ICD-10-CM | POA: Diagnosis not present

## 2012-12-06 DIAGNOSIS — R29818 Other symptoms and signs involving the nervous system: Secondary | ICD-10-CM | POA: Diagnosis not present

## 2012-12-09 DIAGNOSIS — R269 Unspecified abnormalities of gait and mobility: Secondary | ICD-10-CM | POA: Diagnosis not present

## 2012-12-09 DIAGNOSIS — M6281 Muscle weakness (generalized): Secondary | ICD-10-CM | POA: Diagnosis not present

## 2012-12-10 DIAGNOSIS — J45909 Unspecified asthma, uncomplicated: Secondary | ICD-10-CM | POA: Diagnosis not present

## 2012-12-10 DIAGNOSIS — J3089 Other allergic rhinitis: Secondary | ICD-10-CM | POA: Diagnosis not present

## 2012-12-10 DIAGNOSIS — J301 Allergic rhinitis due to pollen: Secondary | ICD-10-CM | POA: Diagnosis not present

## 2012-12-11 DIAGNOSIS — M6281 Muscle weakness (generalized): Secondary | ICD-10-CM | POA: Diagnosis not present

## 2012-12-11 DIAGNOSIS — R269 Unspecified abnormalities of gait and mobility: Secondary | ICD-10-CM | POA: Diagnosis not present

## 2012-12-12 ENCOUNTER — Encounter (INDEPENDENT_AMBULATORY_CARE_PROVIDER_SITE_OTHER): Payer: Self-pay

## 2012-12-12 ENCOUNTER — Encounter: Payer: Self-pay | Admitting: Neurology

## 2012-12-12 ENCOUNTER — Ambulatory Visit (INDEPENDENT_AMBULATORY_CARE_PROVIDER_SITE_OTHER): Payer: Medicare Other | Admitting: Neurology

## 2012-12-12 VITALS — BP 167/78 | HR 70 | Wt 170.0 lb

## 2012-12-12 DIAGNOSIS — G20 Parkinson's disease: Secondary | ICD-10-CM | POA: Diagnosis not present

## 2012-12-12 DIAGNOSIS — R269 Unspecified abnormalities of gait and mobility: Secondary | ICD-10-CM

## 2012-12-12 NOTE — Patient Instructions (Signed)

## 2012-12-12 NOTE — Progress Notes (Signed)
Reason for visit: Parkinson's disease  Brian Aguirre is an 78 y.o. male  History of present illness:  Brian Aguirre is an 79 year old right-handed white male with a history of Parkinson's disease. Since last seen, the patient indicates that he is having more freezing episodes, and he is having occasional problems with dyskinesias. The patient reports some occasional drooling, and occasional tremors. The patient has had one fall since last seen, but he did not injure himself. The patient is using a cane for ambulation, occasionally he will use a walker. The patient does have some dizziness often times in the morning. The patient remains active, and he is getting physical therapy, and he also has a Systems analyst that helps with balance. The patient is having some problems with getting over balanced, going forward or going backwards. The patient will have trouble going through a door secondary to freezing episodes. The patient returns for an evaluation.  Past Medical History  Diagnosis Date  . Parkinson disease   . Osteoporosis   . Dizziness   . Depression   . Hypertension   . Gait disturbance   . Benign enlargement of prostate   . Orthostatic hypotension   . Fracture, rib   . Memory loss     Past Surgical History  Procedure Laterality Date  . Appendectomy    . Tonsillectomy    . Hernia repair    . Cataract extraction Left     Family History  Problem Relation Age of Onset  . Cancer Mother   . Heart failure Father     Social history:  reports that he has never smoked. He does not have any smokeless tobacco history on file. He reports that he does not drink alcohol or use illicit drugs.    Allergies  Allergen Reactions  . Stalevo [Carbidopa-Levodopa-Entacapone] Diarrhea    Medications:  Current Outpatient Prescriptions on File Prior to Visit  Medication Sig Dispense Refill  . carbidopa-levodopa (SINEMET IR) 25-100 MG per tablet TAKE 1 TABLET TWICE DAILY.  60 tablet  6    . carbidopa-levodopa (SINEMET IR) 25-250 MG per tablet Take 1 tablet by mouth 4 (four) times daily.  120 tablet  6  . cholecalciferol (VITAMIN D) 1000 UNITS tablet Take 1,000 Units by mouth daily.      Marland Kitchen docusate sodium (COLACE) 100 MG capsule Take 100 mg by mouth 2 (two) times daily.      . fish oil-omega-3 fatty acids 1000 MG capsule Take 2 g by mouth daily.      . Linaclotide (LINZESS) 290 MCG CAPS Take 1 capsule by mouth daily.  90 capsule  3  . Multiple Vitamin (MULTIVITAMIN WITH MINERALS) TABS Take 1 tablet by mouth daily.      . pramipexole (MIRAPEX) 1.5 MG tablet TAKE 1 TABLET 3 TIMES A DAY.  90 tablet  1  . selegiline (ELDEPRYL) 5 MG capsule Take 1 capsule (5 mg total) by mouth 2 (two) times daily before a meal.  180 capsule  1  . triamcinolone (NASACORT) 55 MCG/ACT nasal inhaler Place 1 spray into the nose daily.      . vitamin B-12 (CYANOCOBALAMIN) 500 MCG tablet Take 500 mcg by mouth daily.       No current facility-administered medications on file prior to visit.    ROS:  Out of a complete 14 system review of symptoms, the patient complains only of the following symptoms, and all other reviewed systems are negative.  Fatigue Itching Double vision  Constipation Impotence Easy bruising Achy muscles Allergies, runny nose, skin sensitivity Numbness, dizziness, tremor Too much sleep, decreased energy, hallucinations, sleepiness  Blood pressure 167/78, pulse 70, weight 170 lb (77.111 kg).  Physical Exam  General: The patient is alert and cooperative at the time of the examination.Masking of the face is seen.  Skin: No significant peripheral edema is noted.   Neurologic Exam  Mental status: The Mini-Mental status examination done today shows a total score 24/30.  Cranial nerves: Facial symmetry is present. Speech is dysphonic. The patient does have some word finding problems.  Extraocular movements are full. Visual fields are full.  Motor: The patient has good  strength in all 4 extremities.  Sensory examination: Soft touch sensation on the face, arms, and legs is symmetric.  Coordination: The patient has good finger-nose-finger and heel-to-shin bilaterally.  Gait and station: The patient is able to arise from a seated position with arms crossed with some difficulty. The patient has relatively good arm swing with walking, some slowness with turns, mild freezing with initiation of ambulation. Tandem gait is unsteady.  Romberg is negative. No drift is seen.  Reflexes: Deep tendon reflexes are symmetric.   Assessment/Plan:  One. Parkinson's disease  2. Gait disorder  3. Memory disorder  The patient is having some increasing problems with walking, bradykinesia, and memory. The patient reports some increasing problems with freezing, and with dyskinesias. The medications have become less effective over time. In the past, the patient could not tolerate Comptan. The patient will be continued on his current medications, and he will continue the current activity levels. In the future, an Exelon patch may be added for the memory issues. The patient will followup in 4 or 5 months.  Brian Palau MD 12/12/2012 9:04 PM  [facility] [address] [place], [place] [address]  Phone [phone] Fax [phone]

## 2012-12-13 DIAGNOSIS — M6281 Muscle weakness (generalized): Secondary | ICD-10-CM | POA: Diagnosis not present

## 2012-12-13 DIAGNOSIS — R269 Unspecified abnormalities of gait and mobility: Secondary | ICD-10-CM | POA: Diagnosis not present

## 2012-12-16 DIAGNOSIS — M6281 Muscle weakness (generalized): Secondary | ICD-10-CM | POA: Diagnosis not present

## 2012-12-16 DIAGNOSIS — R269 Unspecified abnormalities of gait and mobility: Secondary | ICD-10-CM | POA: Diagnosis not present

## 2012-12-18 DIAGNOSIS — M6281 Muscle weakness (generalized): Secondary | ICD-10-CM | POA: Diagnosis not present

## 2012-12-18 DIAGNOSIS — R269 Unspecified abnormalities of gait and mobility: Secondary | ICD-10-CM | POA: Diagnosis not present

## 2012-12-20 DIAGNOSIS — R269 Unspecified abnormalities of gait and mobility: Secondary | ICD-10-CM | POA: Diagnosis not present

## 2012-12-20 DIAGNOSIS — M6281 Muscle weakness (generalized): Secondary | ICD-10-CM | POA: Diagnosis not present

## 2012-12-23 DIAGNOSIS — R634 Abnormal weight loss: Secondary | ICD-10-CM | POA: Diagnosis not present

## 2012-12-23 DIAGNOSIS — I10 Essential (primary) hypertension: Secondary | ICD-10-CM | POA: Diagnosis not present

## 2012-12-25 DIAGNOSIS — M6281 Muscle weakness (generalized): Secondary | ICD-10-CM | POA: Diagnosis not present

## 2012-12-25 DIAGNOSIS — R269 Unspecified abnormalities of gait and mobility: Secondary | ICD-10-CM | POA: Diagnosis not present

## 2012-12-27 DIAGNOSIS — L600 Ingrowing nail: Secondary | ICD-10-CM | POA: Diagnosis not present

## 2012-12-27 DIAGNOSIS — R269 Unspecified abnormalities of gait and mobility: Secondary | ICD-10-CM | POA: Diagnosis not present

## 2012-12-27 DIAGNOSIS — M2040 Other hammer toe(s) (acquired), unspecified foot: Secondary | ICD-10-CM | POA: Diagnosis not present

## 2012-12-27 DIAGNOSIS — M6281 Muscle weakness (generalized): Secondary | ICD-10-CM | POA: Diagnosis not present

## 2012-12-27 DIAGNOSIS — L608 Other nail disorders: Secondary | ICD-10-CM | POA: Diagnosis not present

## 2012-12-28 DIAGNOSIS — R269 Unspecified abnormalities of gait and mobility: Secondary | ICD-10-CM | POA: Diagnosis not present

## 2012-12-28 DIAGNOSIS — M6281 Muscle weakness (generalized): Secondary | ICD-10-CM | POA: Diagnosis not present

## 2012-12-30 DIAGNOSIS — R269 Unspecified abnormalities of gait and mobility: Secondary | ICD-10-CM | POA: Diagnosis not present

## 2012-12-30 DIAGNOSIS — M6281 Muscle weakness (generalized): Secondary | ICD-10-CM | POA: Diagnosis not present

## 2012-12-31 DIAGNOSIS — R269 Unspecified abnormalities of gait and mobility: Secondary | ICD-10-CM | POA: Diagnosis not present

## 2012-12-31 DIAGNOSIS — M6281 Muscle weakness (generalized): Secondary | ICD-10-CM | POA: Diagnosis not present

## 2013-01-01 DIAGNOSIS — M6281 Muscle weakness (generalized): Secondary | ICD-10-CM | POA: Diagnosis not present

## 2013-01-01 DIAGNOSIS — R269 Unspecified abnormalities of gait and mobility: Secondary | ICD-10-CM | POA: Diagnosis not present

## 2013-01-06 DIAGNOSIS — R269 Unspecified abnormalities of gait and mobility: Secondary | ICD-10-CM | POA: Diagnosis not present

## 2013-01-06 DIAGNOSIS — M6281 Muscle weakness (generalized): Secondary | ICD-10-CM | POA: Diagnosis not present

## 2013-01-08 DIAGNOSIS — R269 Unspecified abnormalities of gait and mobility: Secondary | ICD-10-CM | POA: Diagnosis not present

## 2013-01-08 DIAGNOSIS — M6281 Muscle weakness (generalized): Secondary | ICD-10-CM | POA: Diagnosis not present

## 2013-01-09 DIAGNOSIS — R29818 Other symptoms and signs involving the nervous system: Secondary | ICD-10-CM | POA: Diagnosis not present

## 2013-01-09 DIAGNOSIS — R269 Unspecified abnormalities of gait and mobility: Secondary | ICD-10-CM | POA: Diagnosis not present

## 2013-01-09 DIAGNOSIS — M6281 Muscle weakness (generalized): Secondary | ICD-10-CM | POA: Diagnosis not present

## 2013-01-13 DIAGNOSIS — R269 Unspecified abnormalities of gait and mobility: Secondary | ICD-10-CM | POA: Diagnosis not present

## 2013-01-13 DIAGNOSIS — R29818 Other symptoms and signs involving the nervous system: Secondary | ICD-10-CM | POA: Diagnosis not present

## 2013-01-13 DIAGNOSIS — M6281 Muscle weakness (generalized): Secondary | ICD-10-CM | POA: Diagnosis not present

## 2013-01-15 DIAGNOSIS — M6281 Muscle weakness (generalized): Secondary | ICD-10-CM | POA: Diagnosis not present

## 2013-01-15 DIAGNOSIS — R269 Unspecified abnormalities of gait and mobility: Secondary | ICD-10-CM | POA: Diagnosis not present

## 2013-01-15 DIAGNOSIS — R29818 Other symptoms and signs involving the nervous system: Secondary | ICD-10-CM | POA: Diagnosis not present

## 2013-01-17 DIAGNOSIS — R29818 Other symptoms and signs involving the nervous system: Secondary | ICD-10-CM | POA: Diagnosis not present

## 2013-01-17 DIAGNOSIS — M6281 Muscle weakness (generalized): Secondary | ICD-10-CM | POA: Diagnosis not present

## 2013-01-17 DIAGNOSIS — R269 Unspecified abnormalities of gait and mobility: Secondary | ICD-10-CM | POA: Diagnosis not present

## 2013-01-20 DIAGNOSIS — R29818 Other symptoms and signs involving the nervous system: Secondary | ICD-10-CM | POA: Diagnosis not present

## 2013-01-20 DIAGNOSIS — R269 Unspecified abnormalities of gait and mobility: Secondary | ICD-10-CM | POA: Diagnosis not present

## 2013-01-20 DIAGNOSIS — M6281 Muscle weakness (generalized): Secondary | ICD-10-CM | POA: Diagnosis not present

## 2013-01-23 ENCOUNTER — Other Ambulatory Visit: Payer: Self-pay

## 2013-01-23 MED ORDER — PRAMIPEXOLE DIHYDROCHLORIDE 1.5 MG PO TABS: 1.5000 mg | ORAL_TABLET | Freq: Three times a day (TID) | ORAL | Status: DC

## 2013-01-23 MED ORDER — SELEGILINE HCL 5 MG PO CAPS: 5.0000 mg | ORAL_CAPSULE | Freq: Two times a day (BID) | ORAL | Status: DC

## 2013-01-23 MED ORDER — CARBIDOPA-LEVODOPA 25-100 MG PO TABS: 1.0000 | ORAL_TABLET | Freq: Two times a day (BID) | ORAL | Status: DC

## 2013-01-23 MED ORDER — CARBIDOPA-LEVODOPA 25-250 MG PO TABS: 1.0000 | ORAL_TABLET | Freq: Four times a day (QID) | ORAL | Status: DC

## 2013-01-23 MED ORDER — LINACLOTIDE 290 MCG PO CAPS: 290.0000 ug | ORAL_CAPSULE | Freq: Every day | ORAL | Status: DC

## 2013-01-29 DIAGNOSIS — R269 Unspecified abnormalities of gait and mobility: Secondary | ICD-10-CM | POA: Diagnosis not present

## 2013-01-29 DIAGNOSIS — R29818 Other symptoms and signs involving the nervous system: Secondary | ICD-10-CM | POA: Diagnosis not present

## 2013-01-29 DIAGNOSIS — M6281 Muscle weakness (generalized): Secondary | ICD-10-CM | POA: Diagnosis not present

## 2013-02-03 ENCOUNTER — Telehealth: Payer: Self-pay

## 2013-02-03 MED ORDER — PRAMIPEXOLE DIHYDROCHLORIDE 1.5 MG PO TABS: 1.5000 mg | ORAL_TABLET | Freq: Three times a day (TID) | ORAL | Status: DC

## 2013-02-03 NOTE — Telephone Encounter (Signed)
Patient called saying he needs a refill on Mirapex sent to [facility].  We already sent this Rx on 01/15.  pramipexole (MIRAPEX) 1.5 MG tablet 270 tablet 1 01/23/2013     Sig - Route: Take 1 tablet (1.5 mg total) by mouth 3 (three) times daily. - Oral    E-Prescribing Status: Receipt confirmed by pharmacy (01/23/2013 10:49 PM EST)                Pharmacy    [facility] [place] [place] - [address]    I called [facility], spoke with Parral.  She said they have a new policy, and since the patient is on Medicare, the patient has to call and give them permission to send meds.  She states they have called the patient and left him a message regarding this.  Until the patient calls them back, they will not ship any medication.  I called the patient back.  Relaying info that was provided to me.  He verbalized understanding and will call [facility] to authorize shipment of meds.  In the meantime, he would like the Mirapex sent to [facility] while he waits on mail order.

## 2013-03-19 ENCOUNTER — Telehealth: Payer: Self-pay | Admitting: *Deleted

## 2013-03-19 NOTE — Telephone Encounter (Signed)
TC to pt and has had increase exacerbation of PD sx (freezing), weakness, L>R.  No changes in medications.  Offered 1200 03-20-13.  Confirmed.

## 2013-03-20 ENCOUNTER — Ambulatory Visit (INDEPENDENT_AMBULATORY_CARE_PROVIDER_SITE_OTHER): Payer: Medicare Other | Admitting: Neurology

## 2013-03-20 ENCOUNTER — Encounter: Payer: Self-pay | Admitting: Neurology

## 2013-03-20 VITALS — BP 186/79 | HR 67 | Wt 162.0 lb

## 2013-03-20 DIAGNOSIS — R5383 Other fatigue: Secondary | ICD-10-CM

## 2013-03-20 DIAGNOSIS — G20 Parkinson's disease: Secondary | ICD-10-CM

## 2013-03-20 DIAGNOSIS — R5381 Other malaise: Secondary | ICD-10-CM | POA: Diagnosis not present

## 2013-03-20 DIAGNOSIS — R6889 Other general symptoms and signs: Secondary | ICD-10-CM

## 2013-03-20 DIAGNOSIS — R269 Unspecified abnormalities of gait and mobility: Secondary | ICD-10-CM | POA: Diagnosis not present

## 2013-03-20 DIAGNOSIS — D518 Other vitamin B12 deficiency anemias: Secondary | ICD-10-CM | POA: Diagnosis not present

## 2013-03-20 DIAGNOSIS — G20A1 Parkinson's disease without dyskinesia, without mention of fluctuations: Secondary | ICD-10-CM

## 2013-03-20 NOTE — Patient Instructions (Signed)

## 2013-03-20 NOTE — Progress Notes (Signed)
Reason for visit: Parkinson's disease  Brian Aguirre is an 80 y.o. male  History of present illness:  Mr. Meidinger is an 80 year old right-handed white male with a history of Parkinson's disease over the last 10 years. The patient has gradually worsened with his functional level. The patient over the last week has felt poorly, with increased malaise. The patient feels fatigued, but no fevers or chills have been noted. The patient has had worsening problems with freezing, often lasting several minutes. The patient does not relate the freezing episodes to dosing of his medications, but rather, he has episodes that generally occur when he first stands up and tries to initiate walking. The patient has not had any falls. The patient uses a cane for ambulation. In the morning, the patient may have some dizziness, and he needs to get up and sit for several moments before he tries to walk. The patient is sleeping fairly well at night. The patient denies any problems with swallowing or choking, but he does have issues with drooling. The patient returns to this office for an evaluation.  Past Medical History  Diagnosis Date  . Parkinson disease   . Osteoporosis   . Dizziness   . Depression   . Hypertension   . Gait disturbance   . Benign enlargement of prostate   . Orthostatic hypotension   . Fracture, rib   . Memory loss     Past Surgical History  Procedure Laterality Date  . Appendectomy    . Tonsillectomy    . Hernia repair    . Cataract extraction Left     Family History  Problem Relation Age of Onset  . Cancer Mother   . Heart failure Father     Social history:  reports that he has never smoked. He has never used smokeless tobacco. He reports that he does not drink alcohol or use illicit drugs.    Allergies  Allergen Reactions  . Stalevo [Carbidopa-Levodopa-Entacapone] Diarrhea    Medications:  Current Outpatient Prescriptions on File Prior to Visit  Medication Sig Dispense  Refill  . carbidopa-levodopa (SINEMET IR) 25-100 MG per tablet Take 1 tablet by mouth 2 (two) times daily.  180 tablet  1  . carbidopa-levodopa (SINEMET IR) 25-250 MG per tablet Take 1 tablet by mouth 4 (four) times daily.  360 tablet  1  . cholecalciferol (VITAMIN D) 1000 UNITS tablet Take 1,000 Units by mouth daily.      Marland Kitchen docusate sodium (COLACE) 100 MG capsule Take 100 mg by mouth 2 (two) times daily.      . fish oil-omega-3 fatty acids 1000 MG capsule Take 2 g by mouth daily.      . Linaclotide (LINZESS) 290 MCG CAPS capsule Take 1 capsule (290 mcg total) by mouth daily.  90 capsule  1  . Multiple Vitamin (MULTIVITAMIN WITH MINERALS) TABS Take 1 tablet by mouth daily.      . pramipexole (MIRAPEX) 1.5 MG tablet Take 1 tablet (1.5 mg total) by mouth 3 (three) times daily.  90 tablet  1  . selegiline (ELDEPRYL) 5 MG capsule Take 1 capsule (5 mg total) by mouth 2 (two) times daily before a meal.  180 capsule  1  . triamcinolone (NASACORT) 55 MCG/ACT nasal inhaler Place 1 spray into the nose daily.      . vitamin B-12 (CYANOCOBALAMIN) 500 MCG tablet Take 500 mcg by mouth daily.       No current facility-administered medications on file prior  to visit.    ROS:  Out of a complete 14 system review of symptoms, the patient complains only of the following symptoms, and all other reviewed systems are negative.  Fatigue Runny nose, drooling Eye itching, double vision Shortness of breath Constipation Daytime sleepiness Frequency of urination, urgency Bruising easily Memory loss, dizziness, weakness, tremors Hallucinations  Blood pressure 186/79, pulse 67, weight 162 lb (73.483 kg).  Repeat blood pressure sitting, right arm is 142/70.  Physical Exam  General: The patient is alert and cooperative at the time of the examination.  Skin: No significant peripheral edema is noted.   Neurologic Exam  Mental status: The Mini-Mental status examination done today shows a total score of  27/30.  Cranial nerves: Facial symmetry is present. Speech is normal, no aphasia or dysarthria is noted. Extraocular movements are full. Visual fields are full. Masking of the face is noted.  Motor: The patient has good strength in all 4 extremities.  Sensory examination: Soft touch sensation is symmetric on the face, arms, and legs.  Coordination: The patient has good finger-nose-finger and heel-to-shin bilaterally.  Gait and station: The patient is unable to arise from a seated position with arms crossed. Once up, the patient has slight hesitancy with initiation of walking, but once he begins to move, he has good stride. The patient has some hesitation with turns. Tandem gait was not attempted. The patient normally walks with a cane. Romberg is negative. No drift is seen.  Reflexes: Deep tendon reflexes are symmetric.   Assessment/Plan:  1. Parkinson's disease  2. Gait disorder, freezing spells  3. Memory disorder  The patient is having issues with dyskinesias and recent episodes. The freezing episodes are random, mainly related to trying to initiate ambulation. This may be difficult to treat, and the patient will try to pretend that he is stepping over an object to initiate walking. The patient is having a lot of fatigue and malaise recently, and the patient will be sent for further blood work. The patient will followup through this office in 3-4 months. No changes in medications otherwise were made.  Jill Alexanders MD 03/20/2013 6:59 PM  [facility] [address] [place], [place] [address]  Phone [phone] Fax [phone]

## 2013-03-21 LAB — CBC WITH DIFFERENTIAL
BASOS: 1 %
Basophils Absolute: 0.1 10*3/uL (ref 0.0–0.2)
EOS: 3 %
Eosinophils Absolute: 0.2 10*3/uL (ref 0.0–0.4)
HEMATOCRIT: 37.2 % — AB (ref 37.5–51.0)
HEMOGLOBIN: 12.6 g/dL (ref 12.6–17.7)
IMMATURE GRANS (ABS): 0 10*3/uL (ref 0.0–0.1)
Immature Granulocytes: 0 %
LYMPHS ABS: 1 10*3/uL (ref 0.7–3.1)
Lymphs: 18 %
MCH: 31.1 pg (ref 26.6–33.0)
MCHC: 33.9 g/dL (ref 31.5–35.7)
MCV: 92 fL (ref 79–97)
MONOS ABS: 0.4 10*3/uL (ref 0.1–0.9)
Monocytes: 7 %
Neutrophils Absolute: 3.9 10*3/uL (ref 1.4–7.0)
Neutrophils Relative %: 71 %
Platelets: 195 10*3/uL (ref 150–379)
RBC: 4.05 x10E6/uL — AB (ref 4.14–5.80)
RDW: 13.6 % (ref 12.3–15.4)
WBC: 5.6 10*3/uL (ref 3.4–10.8)

## 2013-03-21 LAB — COMPREHENSIVE METABOLIC PANEL
ALBUMIN: 4.3 g/dL (ref 3.5–4.7)
ALT: 6 IU/L (ref 0–44)
AST: 22 IU/L (ref 0–40)
Albumin/Globulin Ratio: 2 (ref 1.1–2.5)
Alkaline Phosphatase: 111 IU/L (ref 39–117)
BUN / CREAT RATIO: 22 (ref 10–22)
BUN: 22 mg/dL (ref 8–27)
CALCIUM: 8.9 mg/dL (ref 8.6–10.2)
CO2: 26 mmol/L (ref 18–29)
CREATININE: 0.99 mg/dL (ref 0.76–1.27)
Chloride: 96 mmol/L — ABNORMAL LOW (ref 97–108)
GFR calc Af Amer: 82 mL/min/{1.73_m2} (ref >59)
GFR calc non Af Amer: 71 mL/min/{1.73_m2} (ref >59)
GLOBULIN, TOTAL: 2.2 g/dL (ref 1.5–4.5)
Glucose: 84 mg/dL (ref 65–99)
Potassium: 4.1 mmol/L (ref 3.5–5.2)
Sodium: 136 mmol/L (ref 134–144)
Total Bilirubin: 0.5 mg/dL (ref 0.0–1.2)
Total Protein: 6.5 g/dL (ref 6.0–8.5)

## 2013-03-21 LAB — TSH: TSH: 1.68 u[IU]/mL (ref 0.450–4.500)

## 2013-03-21 LAB — VITAMIN B12: Vitamin B-12: 1490 pg/mL — ABNORMAL HIGH (ref 211–946)

## 2013-03-21 NOTE — Progress Notes (Signed)
Quick Note:  Shared unremarkable labs with patient,he verbalized understanding ______

## 2013-03-25 DIAGNOSIS — L570 Actinic keratosis: Secondary | ICD-10-CM | POA: Diagnosis not present

## 2013-03-25 DIAGNOSIS — L851 Acquired keratosis [keratoderma] palmaris et plantaris: Secondary | ICD-10-CM | POA: Diagnosis not present

## 2013-03-25 DIAGNOSIS — L219 Seborrheic dermatitis, unspecified: Secondary | ICD-10-CM | POA: Diagnosis not present

## 2013-03-25 DIAGNOSIS — D485 Neoplasm of uncertain behavior of skin: Secondary | ICD-10-CM | POA: Diagnosis not present

## 2013-03-25 DIAGNOSIS — D1801 Hemangioma of skin and subcutaneous tissue: Secondary | ICD-10-CM | POA: Diagnosis not present

## 2013-03-25 DIAGNOSIS — D235 Other benign neoplasm of skin of trunk: Secondary | ICD-10-CM | POA: Diagnosis not present

## 2013-03-25 DIAGNOSIS — D239 Other benign neoplasm of skin, unspecified: Secondary | ICD-10-CM | POA: Diagnosis not present

## 2013-03-25 DIAGNOSIS — Z85828 Personal history of other malignant neoplasm of skin: Secondary | ICD-10-CM | POA: Diagnosis not present

## 2013-04-30 DIAGNOSIS — S300XXA Contusion of lower back and pelvis, initial encounter: Secondary | ICD-10-CM | POA: Diagnosis not present

## 2013-05-09 ENCOUNTER — Telehealth: Payer: Self-pay | Admitting: Neurology

## 2013-05-09 MED ORDER — CARBIDOPA-LEVODOPA 25-250 MG PO TABS: 1.0000 | ORAL_TABLET | Freq: Four times a day (QID) | ORAL | Status: DC

## 2013-05-09 MED ORDER — CARBIDOPA-LEVODOPA 25-100 MG PO TABS: 1.0000 | ORAL_TABLET | Freq: Two times a day (BID) | ORAL | Status: DC

## 2013-05-09 NOTE — Telephone Encounter (Signed)
Patient needs refill called in for Brian Aguirre 25-250--patient states needs Rx today please--[facility] you.

## 2013-05-09 NOTE — Telephone Encounter (Signed)
Rx has been sent  

## 2013-06-03 ENCOUNTER — Telehealth: Payer: Self-pay | Admitting: *Deleted

## 2013-06-04 ENCOUNTER — Ambulatory Visit (INDEPENDENT_AMBULATORY_CARE_PROVIDER_SITE_OTHER): Payer: Medicare Other | Admitting: Neurology

## 2013-06-04 ENCOUNTER — Encounter (INDEPENDENT_AMBULATORY_CARE_PROVIDER_SITE_OTHER): Payer: Self-pay

## 2013-06-04 ENCOUNTER — Encounter: Payer: Self-pay | Admitting: Neurology

## 2013-06-04 VITALS — BP 188/85 | HR 67 | Wt 158.0 lb

## 2013-06-04 DIAGNOSIS — R5383 Other fatigue: Secondary | ICD-10-CM

## 2013-06-04 DIAGNOSIS — G20 Parkinson's disease: Secondary | ICD-10-CM

## 2013-06-04 DIAGNOSIS — R269 Unspecified abnormalities of gait and mobility: Secondary | ICD-10-CM

## 2013-06-04 DIAGNOSIS — R42 Dizziness and giddiness: Secondary | ICD-10-CM | POA: Diagnosis not present

## 2013-06-04 DIAGNOSIS — R5381 Other malaise: Secondary | ICD-10-CM

## 2013-06-04 NOTE — Progress Notes (Signed)
Reason for visit: Parkinson's disease  Brian Aguirre is an 80 y.o. male  History of present illness:  Brian Aguirre is an 80 year old right-handed white male with a history of Parkinson's disease. The patient indicates that within the last 2 weeks, he has had some gradual increase in malaise and increased dizziness. The patient indicates that the dizziness does not depend upon body position, and is present with lying down, sitting, or standing. The patient has a sensation of generalized weakness, more prominent on the left leg. The patient has had some slight gait imbalance, but no falls. The patient uses a cane or a walker for ambulation. The patient denies any cough, or fevers. Over the last 2 or 3 months, he has had occasional night sweats. The patient has had 2 falls since last seen, but he has not hit his head with the falls. The patient has had some increased difficulty getting out of bed. The freezing episodes of increased. The patient indicates that recently, as Mirapex tablets have looked different. He comes back to this office for an evaluation. The patient reports no focal numbness or weakness of the face, arms, or legs. The patient does have some problems swallowing large tablets, otherwise no dysphagia is noted.  Past Medical History  Diagnosis Date  . Parkinson disease   . Osteoporosis   . Dizziness   . Depression   . Hypertension   . Gait disturbance   . Benign enlargement of prostate   . Orthostatic hypotension   . Fracture, rib   . Memory loss     Past Surgical History  Procedure Laterality Date  . Appendectomy    . Tonsillectomy    . Hernia repair    . Cataract extraction Left     Family History  Problem Relation Age of Onset  . Cancer Mother   . Heart failure Father     Social history:  reports that he has never smoked. He has never used smokeless tobacco. He reports that he does not drink alcohol or use illicit drugs.    Allergies  Allergen Reactions  .  Stalevo [Carbidopa-Levodopa-Entacapone] Diarrhea    Medications:  Current Outpatient Prescriptions on File Prior to Visit  Medication Sig Dispense Refill  . carbidopa-levodopa (SINEMET IR) 25-100 MG per tablet Take 1 tablet by mouth 2 (two) times daily.  180 tablet  1  . carbidopa-levodopa (SINEMET IR) 25-250 MG per tablet Take 1 tablet by mouth 4 (four) times daily.  360 tablet  1  . cholecalciferol (VITAMIN D) 1000 UNITS tablet Take 1,000 Units by mouth daily.      Marland Kitchen docusate sodium (COLACE) 100 MG capsule Take 100 mg by mouth 2 (two) times daily.      . fish oil-omega-3 fatty acids 1000 MG capsule Take 2 g by mouth daily.      . Linaclotide (LINZESS) 290 MCG CAPS capsule Take 1 capsule (290 mcg total) by mouth daily.  90 capsule  1  . Multiple Vitamin (MULTIVITAMIN WITH MINERALS) TABS Take 1 tablet by mouth daily.      . pramipexole (MIRAPEX) 1.5 MG tablet Take 1 tablet (1.5 mg total) by mouth 3 (three) times daily.  90 tablet  1  . selegiline (ELDEPRYL) 5 MG capsule Take 1 capsule (5 mg total) by mouth 2 (two) times daily before a meal.  180 capsule  1  . triamcinolone (NASACORT) 55 MCG/ACT nasal inhaler Place 1 spray into the nose daily.      Marland Kitchen  vitamin B-12 (CYANOCOBALAMIN) 500 MCG tablet Take 500 mcg by mouth daily.       No current facility-administered medications on file prior to visit.    ROS:  Out of a complete 14 system review of symptoms, the patient complains only of the following symptoms, and all other reviewed systems are negative.  Fatigue, excessive sweating Difficulty swallowing Double vision, worse with right gaze Shortness of breath Abdominal pain, constipation Frequent waking, daytime sleepiness Frequency of urination, urgency Achy muscles, muscle cramps, walking difficulties Skin wounds Dizziness, weakness Hallucinations  Blood pressure 188/85, pulse 67, weight 158 lb (71.668 kg).  Blood pressure, right arm, standing is 142/78. Blood pressure, right arm,  sitting is 152/86.  Physical Exam  General: The patient is alert and cooperative at the time of the examination.  Skin: No significant peripheral edema is noted.   Neurologic Exam  Mental status: The patient is oriented x 3.  Cranial nerves: Facial symmetry is present. Speech is normal, no aphasia or dysarthria is noted. Extraocular movements are full, with exception that there is some restriction of superior gaze.Brian Aguirre fields are full.  Motor: The patient has good strength in all 4 extremities. Dyskinesias are seen during the examination.  Sensory examination: Soft touch sensation is symmetric on the face, arms, and legs.  Coordination: The patient has good finger-nose-finger and heel-to-shin bilaterally, with exception that the patient has some difficulty with heel shin with the left leg.  Gait and station: The patient has difficulty arising from a seated position with arms crossed. Once up, the patient is able to ambulate with a cane. Some hesitation returns are noted, but otherwise the patient has good stride. Tandem gait was not attempted. Romberg is negative. No drift is seen.  Reflexes: Deep tendon reflexes are symmetric.   Assessment/Plan:  One. Parkinson's disease  2. Dizziness  3. Gait instability  The patient had very similar complaints back in March of 2015, and blood work was done at that time that included a chemistry profile, CBC, thyroid profile, and B12 level. The blood work was relatively unremarkable. The patient believes that there may have been a change in generic manufactures of his Mirapex recently. If they can correlate the onset of symptoms with this change, this could be the etiology of his recent symptoms. Further blood work will be done today to exclude the possibility of a testosterone deficiency. The patient will undergo a CT scan of brain if the blood work is unremarkable. The patient will followup in 3 months.  Jill Alexanders MD 06/04/2013 5:04  PM  [facility] [address] [place], [place] [address]  Phone [phone] Fax [phone]

## 2013-06-04 NOTE — Patient Instructions (Signed)

## 2013-06-04 NOTE — Telephone Encounter (Signed)
I called the patient. The patient has been feeling poorly for the last 2 or 3 days with dizziness that is associated with a lightheaded sensation, not vertigo. The dizziness is present to some degree with sitting, lying down, and standing. He has not noted any change in his medication, and no new medications have been added or subtracted. The patient will be seen today at 12 noon. He has some sensation of generalized malaise.

## 2013-06-05 ENCOUNTER — Ambulatory Visit: Payer: Self-pay | Admitting: Neurology

## 2013-06-06 LAB — CBC WITH DIFFERENTIAL
Basophils Absolute: 0 10*3/uL (ref 0.0–0.2)
Basos: 1 %
EOS: 4 %
Eosinophils Absolute: 0.2 10*3/uL (ref 0.0–0.4)
HEMATOCRIT: 37.5 % (ref 37.5–51.0)
Hemoglobin: 12.7 g/dL (ref 12.6–17.7)
IMMATURE GRANULOCYTES: 0 %
Immature Grans (Abs): 0 10*3/uL (ref 0.0–0.1)
LYMPHS ABS: 0.9 10*3/uL (ref 0.7–3.1)
Lymphs: 16 %
MCH: 31.7 pg (ref 26.6–33.0)
MCHC: 33.9 g/dL (ref 31.5–35.7)
MCV: 94 fL (ref 79–97)
MONOCYTES: 6 %
Monocytes Absolute: 0.3 10*3/uL (ref 0.1–0.9)
Neutrophils Absolute: 4.3 10*3/uL (ref 1.4–7.0)
Neutrophils Relative %: 73 %
PLATELETS: 202 10*3/uL (ref 150–379)
RBC: 4.01 x10E6/uL — AB (ref 4.14–5.80)
RDW: 13.2 % (ref 12.3–15.4)
WBC: 5.8 10*3/uL (ref 3.4–10.8)

## 2013-06-06 LAB — TESTOSTERONE: TESTOSTERONE: 571 ng/dL (ref 348–1197)

## 2013-06-06 LAB — SEDIMENTATION RATE: Sed Rate: 15 mm/hr (ref 0–30)

## 2013-06-06 LAB — COPPER, SERUM: COPPER: 91 ug/dL (ref 72–166)

## 2013-06-09 ENCOUNTER — Telehealth: Payer: Self-pay | Admitting: Neurology

## 2013-06-09 NOTE — Telephone Encounter (Signed)
I called the patient. The blood work is OK. We will consider getting a head scan if the patient finds that the change in the generic manufacturer of the mirapex does not correlate with the onset of symptoms.

## 2013-06-10 ENCOUNTER — Telehealth: Payer: Self-pay | Admitting: Neurology

## 2013-06-10 MED ORDER — PRAMIPEXOLE DIHYDROCHLORIDE 1.5 MG PO TABS: 1.5000 mg | ORAL_TABLET | Freq: Three times a day (TID) | ORAL | Status: DC

## 2013-06-10 NOTE — Telephone Encounter (Signed)
Has some information about medication that he and the provider have discussed. Would not elaborate. He states that it is to complicated and he needs to speak to the provider.

## 2013-06-10 NOTE — Telephone Encounter (Signed)
I called patient. The Mirapex generic manufacturer was changed on 05/22/2013. The generic manufacturer went from [facility] to [employer]. The patient began to experience increased dizziness and malaise since that time. We will see if we can get the [place] again.

## 2013-06-11 ENCOUNTER — Telehealth: Payer: Self-pay

## 2013-06-11 ENCOUNTER — Telehealth: Payer: Self-pay | Admitting: Neurology

## 2013-06-11 NOTE — Telephone Encounter (Signed)
I spoke with [facility] who said they tried to order the specific Manufacturer requested, but are not able to get it.  I called [employer] (also listed in chart).  They said they would not be able to get specific manufacturers.  They state the patient will have to get it from a local pharmacy that is able to order it for him.  The only way to determine this would be to call the pharmacies and ask if they can order it.  She also said if the patients particular policy prohibits him from getting refills locally, he will have to call ins and explain the situation so they can override this.  I called and spoke with the patient.  He said he has spoken with [employer] to ask for override.  He is aware [employer] cannot get he Art therapist.  Says they have a different manufacturer that he has not tried before (says it begins with ZY), but is willing to try.  He will contact us if there are any issues with this drug.  I called the pharmacy back.  They will fill Rx.

## 2013-06-11 NOTE — Telephone Encounter (Signed)
Patient calling to state that he would like a call back from [name] to discuss the medication change they have been talking together about. Please return call and advise.

## 2013-06-11 NOTE — Telephone Encounter (Signed)
I spoke with [facility] who said they tried to order the specific Manufacturer requested, but are not able to get it.  I called [employer] (also listed in chart).  They said they would not be able to get specific manufacturers.  They state the patient will have to get it from a local pharmacy that is able to order it for him.  The only way to determine this would be to call the pharmacies and ask if they can order it.  She also said if the patients particular policy prohibits him from getting refills locally, he will have to call ins and explain the situation so they can override this.  I called and spoke with the patient.  He said he has spoken with [employer] to ask for override.  He is aware [employer] cannot get he Art therapist.  Says they have a different manufacturer that he has not tried before (says it begins with ZY), but is willing to try.  He will contact us if there are any issues with this drug.  Told him if there were any problems, we would be happy to try and help him locate a pharmacy that is able to order [employer].  He was very Patent attorney.

## 2013-06-12 ENCOUNTER — Ambulatory Visit: Payer: Medicare Other | Admitting: Neurology

## 2013-06-30 DIAGNOSIS — Z0000 Encounter for general adult medical examination without abnormal findings: Secondary | ICD-10-CM | POA: Diagnosis not present

## 2013-06-30 DIAGNOSIS — Z23 Encounter for immunization: Secondary | ICD-10-CM | POA: Diagnosis not present

## 2013-06-30 DIAGNOSIS — Z1331 Encounter for screening for depression: Secondary | ICD-10-CM | POA: Diagnosis not present

## 2013-07-02 DIAGNOSIS — H5310 Unspecified subjective visual disturbances: Secondary | ICD-10-CM | POA: Diagnosis not present

## 2013-07-02 DIAGNOSIS — G20 Parkinson's disease: Secondary | ICD-10-CM | POA: Diagnosis not present

## 2013-07-02 DIAGNOSIS — Z961 Presence of intraocular lens: Secondary | ICD-10-CM | POA: Diagnosis not present

## 2013-07-02 DIAGNOSIS — H16109 Unspecified superficial keratitis, unspecified eye: Secondary | ICD-10-CM | POA: Diagnosis not present

## 2013-07-09 DIAGNOSIS — R351 Nocturia: Secondary | ICD-10-CM | POA: Diagnosis not present

## 2013-07-09 DIAGNOSIS — N138 Other obstructive and reflux uropathy: Secondary | ICD-10-CM | POA: Diagnosis not present

## 2013-07-09 DIAGNOSIS — N401 Enlarged prostate with lower urinary tract symptoms: Secondary | ICD-10-CM | POA: Diagnosis not present

## 2013-07-09 DIAGNOSIS — N139 Obstructive and reflux uropathy, unspecified: Secondary | ICD-10-CM | POA: Diagnosis not present

## 2013-07-23 ENCOUNTER — Telehealth: Payer: Self-pay | Admitting: Neurology

## 2013-07-23 NOTE — Telephone Encounter (Signed)
Patient requesting a sooner apt due to problems with his Parkinson's disease with Increased urination at night, dizziness, weakness more pronounced frozen body. Please call to advise.

## 2013-07-23 NOTE — Telephone Encounter (Signed)
I called patient. The patient has had some increased problems with freezing over the last 1 week, but he indicates that he has not had any falls. He reports that he is up and down at night, and averaging 5 times in the bathroom. The patient is not sleeping well, he feels exhausted. He is seeing Dr. Jeffie Pollock for this issue. He has put a call into Dr. Jeffie Pollock. The patient is concerned that medication such as oxybutynin may worsen his constipation which has been a significant issue for him. I have indicated that some of the newer medications for the bladder may not promote constipation quite as much. Treating the bladder issue, and allowing for better rest at night may improve his Parkinson's function. I'll try to get him worked in the office within the next couple weeks.

## 2013-07-23 NOTE — Telephone Encounter (Signed)
Please advise previous note. Pt's next OV is 09/04/13. Thanks

## 2013-07-24 DIAGNOSIS — M47817 Spondylosis without myelopathy or radiculopathy, lumbosacral region: Secondary | ICD-10-CM | POA: Diagnosis not present

## 2013-07-24 DIAGNOSIS — M25559 Pain in unspecified hip: Secondary | ICD-10-CM | POA: Diagnosis not present

## 2013-07-24 DIAGNOSIS — M659 Synovitis and tenosynovitis, unspecified: Secondary | ICD-10-CM | POA: Diagnosis not present

## 2013-07-28 ENCOUNTER — Encounter: Payer: Self-pay | Admitting: Neurology

## 2013-07-28 ENCOUNTER — Ambulatory Visit (INDEPENDENT_AMBULATORY_CARE_PROVIDER_SITE_OTHER): Payer: Medicare Other | Admitting: Neurology

## 2013-07-28 ENCOUNTER — Encounter (INDEPENDENT_AMBULATORY_CARE_PROVIDER_SITE_OTHER): Payer: Self-pay

## 2013-07-28 VITALS — BP 130/72 | HR 72 | Wt 158.0 lb

## 2013-07-28 DIAGNOSIS — R269 Unspecified abnormalities of gait and mobility: Secondary | ICD-10-CM

## 2013-07-28 DIAGNOSIS — I951 Orthostatic hypotension: Secondary | ICD-10-CM

## 2013-07-28 DIAGNOSIS — G20 Parkinson's disease: Secondary | ICD-10-CM

## 2013-07-28 MED ORDER — MIRABEGRON ER 25 MG PO TB24: 25.0000 mg | ORAL_TABLET | Freq: Every day | ORAL | Status: DC

## 2013-07-28 NOTE — Patient Instructions (Signed)

## 2013-07-28 NOTE — Progress Notes (Signed)
Reason for visit: Parkinson's disease  Brian Aguirre is an 79 y.o. male  History of present illness:  Brian Aguirre is an 80 year old right-handed white male with a history of Parkinson's disease associated with a gait disorder. Brian Aguirre has in the past had some issues with orthostatic hypotension. Brian Aguirre occasionally gets dizzy and fainty. Brian Aguirre is having increasing problems with freezing over the last 2 weeks. Brian Aguirre is on Sinemet 25/100 taking one twice a day and on the Sinemet 25/250 tablet taking one 4 times daily. Brian Aguirre also takes Mirapex 1.5 mg 3 times daily, and Eldepryl 5 mg twice daily. The has been unable to get back on his generic manufacturer of the Mirapex. When this was changed, Brian Aguirre had a decline in his ability to function. More recently, Brian Aguirre has had a lot of issues with urinary frequency, and Brian Aguirre is getting up approximately 5 times at night. Both Brian Aguirre and his wife are exhausted secondary to lack of sleep. The patient walks with a cane, Brian Aguirre denies any falls, but Brian Aguirre has on occasion had stumbles, when Brian Aguirre will go backwards. The patient does report some hallucinations, but these are not upsetting to him. Brian Aguirre may see bugs, and occasionally people. Brian Aguirre returns to this office for an evaluation. Brian Aguirre is having minimal problems with tremor, but occasionally Brian Aguirre will have dyskinesias during the day. The dyskinesias mainly are affecting the lower extremities. Brian Aguirre is remaining active, Brian Aguirre has a Physiological scientist that Brian Aguirre sees twice a week.  Past Medical History  Diagnosis Date  . Parkinson disease   . Osteoporosis   . Dizziness   . Depression   . Hypertension   . Gait disturbance   . Benign enlargement of prostate   . Orthostatic hypotension   . Fracture, rib   . Memory loss     Past Surgical History  Procedure Laterality Date  . Appendectomy    . Tonsillectomy    . Hernia repair    . Cataract extraction Left     Family History  Problem Relation Age of Onset  . Cancer Mother   . Heart failure Father     Social  history:  reports that Brian Aguirre has never smoked. Brian Aguirre has never used smokeless tobacco. Brian Aguirre reports that Brian Aguirre does not drink alcohol or use illicit drugs.    Allergies  Allergen Reactions  . Stalevo [Carbidopa-Levodopa-Entacapone] Diarrhea    Medications:  Current Outpatient Prescriptions on File Prior to Visit  Medication Sig Dispense Refill  . carbidopa-levodopa (SINEMET IR) 25-100 MG per tablet Take 1 tablet by mouth 2 (two) times daily.  180 tablet  1  . carbidopa-levodopa (SINEMET IR) 25-250 MG per tablet Take 1 tablet by mouth 4 (four) times daily.  360 tablet  1  . cholecalciferol (VITAMIN D) 1000 UNITS tablet Take 1,000 Units by mouth daily.      Marland Kitchen docusate sodium (COLACE) 100 MG capsule Take 100 mg by mouth 2 (two) times daily.      . fish oil-omega-3 fatty acids 1000 MG capsule Take 2 g by mouth daily.      . Linaclotide (LINZESS) 290 MCG CAPS capsule Take 1 capsule (290 mcg total) by mouth daily.  90 capsule  1  . Multiple Vitamin (MULTIVITAMIN WITH MINERALS) TABS Take 1 tablet by mouth daily.      . pramipexole (MIRAPEX) 1.5 MG tablet Take 1 tablet (1.5 mg total) by mouth 3 (three) times daily.  90 tablet  1  . selegiline (ELDEPRYL) 5 MG  capsule Take 1 capsule (5 mg total) by mouth 2 (two) times daily before a meal.  180 capsule  1  . triamcinolone (NASACORT) 55 MCG/ACT nasal inhaler Place 1 spray into the nose daily.      . vitamin B-12 (CYANOCOBALAMIN) 500 MCG tablet Take 500 mcg by mouth daily.       No current facility-administered medications on file prior to visit.    ROS:  Out of a complete 14 system review of symptoms, the patient complains only of the following symptoms, and all other reviewed systems are negative.  Fatigue Drooling Double vision Constipation Frequency of urination, urgency Bruising easily Memory loss, dizziness, weakness Hallucinations  Blood pressure 130/72, pulse 72, weight 158 lb (71.668 kg).  Blood pressure, right arm, standing is  152/70.  Physical Exam  General: The patient is alert and cooperative at the time of the examination. Brian Aguirre has a slight tendency to lean to the right.  Skin: No significant peripheral edema is noted.   Neurologic Exam  Mental status: The patient is oriented x 3. Mental status examination done today shows a total score of 28/30.  Cranial nerves: Facial symmetry is present. Speech is normal, no aphasia or dysarthria is noted. Extraocular movements are full. Visual fields are full. Masking of the face is seen.  Motor: The patient has good strength in all 4 extremities.  Sensory examination: Soft touch sensation is symmetric on the face, arms, or legs.  Coordination: The patient has good finger-nose-finger and heel-to-shin bilaterally. Apraxia is seen with the use of the extremities.  Gait and station: The patient is unable to arise from a seated position with arms crossed. Brian Aguirre requires assistance with standing. Brian Aguirre uses a cane for ambulation. In order to initiate ambulation, Brian Aguirre must rock back and forth first, once Brian Aguirre is walking, Brian Aguirre has good stride, decreased but present arm swing. Tandem gait was not attempted. Romberg is negative.  Reflexes: Deep tendon reflexes are symmetric.   Assessment/Plan:  One. Parkinson's disease  2. Gait disorder  3. Urinary frequency  The patient had a decline in his functional level over the last 2 weeks. This seems to correlate with an increased problem with the bladder, and the patient is not sleeping well at night. The fatigue may be the primary issue that has deteriorated the functional level with Parkinson's disease. The patient will be given Myrbetriq to take at night, 25 mg. If this is helpful, the dose can be increased to 50 mg at night. The patient will followup in about 3 months. If need be, the Sinemet may be increased in the future slightly.  Jill Alexanders MD 07/28/2013 7:13 PM  [facility] [address] [place], [place] [address]  Phone [phone] Fax [phone]

## 2013-08-09 ENCOUNTER — Other Ambulatory Visit: Payer: Self-pay | Admitting: Neurology

## 2013-08-13 ENCOUNTER — Other Ambulatory Visit: Payer: Self-pay

## 2013-08-13 MED ORDER — LINACLOTIDE 290 MCG PO CAPS: 290.0000 ug | ORAL_CAPSULE | Freq: Every day | ORAL | Status: DC

## 2013-08-21 DIAGNOSIS — M6281 Muscle weakness (generalized): Secondary | ICD-10-CM | POA: Diagnosis not present

## 2013-08-22 DIAGNOSIS — R32 Unspecified urinary incontinence: Secondary | ICD-10-CM | POA: Diagnosis not present

## 2013-08-22 DIAGNOSIS — R351 Nocturia: Secondary | ICD-10-CM | POA: Diagnosis not present

## 2013-08-25 ENCOUNTER — Telehealth: Payer: Self-pay | Admitting: Neurology

## 2013-08-25 NOTE — Telephone Encounter (Signed)
[name] Constance Holster calling to state that she believes patient's Linzess is not working as well as it was before, wants to know if there's anything else patient can try or if dosage needs to be increased. Please return call and advise.

## 2013-08-26 DIAGNOSIS — L570 Actinic keratosis: Secondary | ICD-10-CM | POA: Diagnosis not present

## 2013-08-26 DIAGNOSIS — Z85828 Personal history of other malignant neoplasm of skin: Secondary | ICD-10-CM | POA: Diagnosis not present

## 2013-08-26 DIAGNOSIS — C44621 Squamous cell carcinoma of skin of unspecified upper limb, including shoulder: Secondary | ICD-10-CM | POA: Diagnosis not present

## 2013-08-26 DIAGNOSIS — L089 Local infection of the skin and subcutaneous tissue, unspecified: Secondary | ICD-10-CM | POA: Diagnosis not present

## 2013-08-26 DIAGNOSIS — D485 Neoplasm of uncertain behavior of skin: Secondary | ICD-10-CM | POA: Diagnosis not present

## 2013-08-26 MED ORDER — LUBIPROSTONE 24 MCG PO CAPS: 24.0000 ug | ORAL_CAPSULE | Freq: Two times a day (BID) | ORAL | Status: DC

## 2013-08-26 NOTE — Telephone Encounter (Signed)
I called the patient. He is on Linzess, and he has got good benefit. Unfortunately, more recently, constipation has become more of an issue. He was given a prescription for Gelnigue (oxybutynin topical gel), but this may worsen his orthostasis and constipation. I will try Amitiza for the constipation taking 24 mcg twice daily. He will stop the Linzess while they are on the [place].

## 2013-08-26 NOTE — Telephone Encounter (Signed)
Spoke with patient's wife and she relayed that the patient has been on Linzess 262mcg for about 2 years and it had made a big difference.  Now she has noticed gradually it's not as effective.  She wants to know if he can take a stronger dose or is there something else available.

## 2013-08-27 DIAGNOSIS — M6281 Muscle weakness (generalized): Secondary | ICD-10-CM | POA: Diagnosis not present

## 2013-08-28 ENCOUNTER — Telehealth: Payer: Self-pay | Admitting: Neurology

## 2013-08-28 NOTE — Telephone Encounter (Signed)
Brian Aguirre with [employer] @ [phone],  requesting prior authorization for lubiprostone (AMITIZA) 24 MCG capsule.  Please call and advise.

## 2013-08-28 NOTE — Telephone Encounter (Signed)
I called back.  They asked that we fax all clinical info to them at [phone].  All info has been faxed, request is currently under review.

## 2013-08-29 ENCOUNTER — Telehealth: Payer: Self-pay

## 2013-08-29 NOTE — Telephone Encounter (Signed)
[employer] notified us they have approved our request for coverage on Amitiza effective until 12/26/2013 Ref # [id number]

## 2013-09-03 DIAGNOSIS — M6281 Muscle weakness (generalized): Secondary | ICD-10-CM | POA: Diagnosis not present

## 2013-09-04 ENCOUNTER — Ambulatory Visit (INDEPENDENT_AMBULATORY_CARE_PROVIDER_SITE_OTHER): Payer: Medicare Other | Admitting: Neurology

## 2013-09-04 ENCOUNTER — Encounter: Payer: Self-pay | Admitting: Neurology

## 2013-09-04 VITALS — BP 141/66 | HR 72 | Wt 159.0 lb

## 2013-09-04 DIAGNOSIS — I10 Essential (primary) hypertension: Secondary | ICD-10-CM | POA: Diagnosis not present

## 2013-09-04 DIAGNOSIS — G20 Parkinson's disease: Secondary | ICD-10-CM | POA: Diagnosis not present

## 2013-09-04 DIAGNOSIS — G20A1 Parkinson's disease without dyskinesia, without mention of fluctuations: Secondary | ICD-10-CM

## 2013-09-04 MED ORDER — CARBIDOPA-LEVODOPA 25-100 MG PO TABS: 1.0000 | ORAL_TABLET | Freq: Three times a day (TID) | ORAL | Status: DC

## 2013-09-04 NOTE — Progress Notes (Addendum)
Reason for visit: Parkinson's disease  Brian Aguirre is an 81 y.o. male  History of present illness:  Brian Aguirre is an 80 year old right-handed white male with a history of Parkinson's disease. He has had a gradual decline in his ability to function, with increasing problems with balance and multiple falls. The patient has fallen 3 times within the last week prior to this evaluation. The patient has recently had a significant increase in his blood pressure, going to 220/116. The patient developed increased dizziness at this time. He has been placed on low-dose lisinopril, 2.5 mg daily. Previously, he was having some episodes of orthostatic hypotension. The patient is developing a tendency to lean backwards with standing. He has had worsening problems with constipation, and the Linzess is not completely effective. The patient will be given a trial on Amitiza to see if this is effective. The patient is using a catheter at night to help him not have to get up to go to the bathroom, and this has been helpful. The patient is sleeping better. The patient is considering the use of acupuncture for bladder control. He returns for an evaluation.  Past Medical History  Diagnosis Date  . Parkinson disease   . Osteoporosis   . Dizziness   . Depression   . Hypertension   . Gait disturbance   . Benign enlargement of prostate   . Orthostatic hypotension   . Fracture, rib   . Memory loss     Past Surgical History  Procedure Laterality Date  . Appendectomy    . Tonsillectomy    . Hernia repair    . Cataract extraction Left     Family History  Problem Relation Age of Onset  . Cancer Mother   . Heart failure Father     Social history:  reports that he has never smoked. He has never used smokeless tobacco. He reports that he does not drink alcohol or use illicit drugs.    Allergies  Allergen Reactions  . Stalevo [Carbidopa-Levodopa-Entacapone] Diarrhea    Medications:  Current Outpatient  Prescriptions on File Prior to Visit  Medication Sig Dispense Refill  . carbidopa-levodopa (SINEMET IR) 25-250 MG per tablet Take 1 tablet by mouth 4 (four) times daily.  360 tablet  1  . cholecalciferol (VITAMIN D) 1000 UNITS tablet Take 1,000 Units by mouth daily.      Marland Kitchen docusate sodium (COLACE) 100 MG capsule Take 100 mg by mouth 2 (two) times daily.      . fish oil-omega-3 fatty acids 1000 MG capsule Take 2 g by mouth daily.      . Linaclotide (LINZESS) 290 MCG CAPS capsule Take 1 capsule (290 mcg total) by mouth daily.  90 capsule  3  . lubiprostone (AMITIZA) 24 MCG capsule Take 1 capsule (24 mcg total) by mouth 2 (two) times daily with a meal.  30 capsule  1  . Multiple Vitamin (MULTIVITAMIN WITH MINERALS) TABS Take 1 tablet by mouth daily.      . pramipexole (MIRAPEX) 1.5 MG tablet TAKE 1 TABLET 3 TIMES A DAY.  90 tablet  6  . selegiline (ELDEPRYL) 5 MG capsule Take 1 capsule (5 mg total) by mouth 2 (two) times daily before a meal.  180 capsule  1  . triamcinolone (NASACORT) 55 MCG/ACT nasal inhaler Place 1 spray into the nose daily.      . vitamin B-12 (CYANOCOBALAMIN) 500 MCG tablet Take 500 mcg by mouth daily.  No current facility-administered medications on file prior to visit.    ROS:  Out of a complete 14 system review of symptoms, the patient complains only of the following symptoms, and all other reviewed systems are negative.  Fatigue Bruising easily Gait disorder, falls  Blood pressure 141/66, pulse 72, weight 159 lb (72.122 kg).   Blood pressure, right arm, standing is 126/74. Blood pressure, right arm, sitting is 128/80.  Physical Exam  General: The patient is alert and cooperative at the time of the examination.  Skin: No significant peripheral edema is noted.   Neurologic Exam  Mental status: The patient is oriented x 3.  Cranial nerves: Facial symmetry is present. Speech is normal, no aphasia or dysarthria is noted. Extraocular movements are full.  Visual fields are full. Masking of the face is seen.  Motor: The patient has good strength in all 4 extremities.  Sensory examination: Soft touch sensation is symmetric on the face, arms, and legs.  Coordination: The patient has good finger-nose-finger and heel-to-shin bilaterally.  Gait and station: The patient requires assistance with standing. He has a tendency to lean backwards. The patient has significant issues with freezing while attempting to initiate ambulation. The patient walks with a cane, once he begins walking, he has fairly good stability, good stride. No tremor is seen. The patient has difficulty with turns. Tandem gait was not attempted.  Reflexes: Deep tendon reflexes are symmetric.   Assessment/Plan:  One. Parkinson's disease  2. Gait disorder  3. Chronic constipation  Constipation issues have become quite severe, and are now dominating his life. The overall functional level with Parkinson's disease is declining fairly rapidly over the last 6 months. The patient is having a tendency to lean backwards, he is having multiple falls. The patient will be increased on the Sinemet 25/100 mg tablets 3 times daily. The patient will be set up for physical therapy for gait training. He is to go on the Amitiza for his bowels to see if this improves this issue. He will followup in October 2015.  Jill Alexanders MD 09/04/2013 8:22 PM  [facility] [address] [place], [place] [address]  Phone [phone] Fax [phone]

## 2013-09-04 NOTE — Patient Instructions (Signed)

## 2013-09-05 ENCOUNTER — Telehealth: Payer: Self-pay | Admitting: Neurology

## 2013-09-05 NOTE — Telephone Encounter (Signed)
I called patient. The primary care doctor ordered a 5 milligram dose of the lisinopril. This is still a low dose of medication. The patient told me yesterday that he was on the lowest dose of the medication, which is a 2.5 mg tablet. The actual prescription is for a 5.0 mg tablet. The blood pressures will need to be followed, but I would follow the directions of the primary care physician.

## 2013-09-05 NOTE — Telephone Encounter (Signed)
Patient saw PCP on yesterday regarding High Blood pressure episode.  Was given Lisinopril 5 mg and [name] recommended 2.5 dose.  Questioning if he should take 5 mg Lisinopril?  Please call anytime and leave detailed message on voice mail.

## 2013-09-05 NOTE — Telephone Encounter (Signed)
Patient saw PCP on yesterday regarding High Blood pressure episode. Was given Lisinopril 5 mg and [name] recommended 2.5 dose. Questioning if he should take 5 mg Lisinopril? Please call anytime and leave detailed message on voice mail.

## 2013-09-08 ENCOUNTER — Ambulatory Visit: Payer: Medicare Other | Admitting: Neurology

## 2013-09-08 ENCOUNTER — Other Ambulatory Visit: Payer: Self-pay

## 2013-09-08 DIAGNOSIS — M6281 Muscle weakness (generalized): Secondary | ICD-10-CM | POA: Diagnosis not present

## 2013-09-08 MED ORDER — SELEGILINE HCL 5 MG PO CAPS: 5.0000 mg | ORAL_CAPSULE | Freq: Two times a day (BID) | ORAL | Status: DC

## 2013-09-10 DIAGNOSIS — M6281 Muscle weakness (generalized): Secondary | ICD-10-CM | POA: Diagnosis not present

## 2013-09-13 ENCOUNTER — Emergency Department (HOSPITAL_COMMUNITY)
Admission: EM | Admit: 2013-09-13 | Discharge: 2013-09-13 | Disposition: A | Discharge status: Home / Self Care | Payer: Medicare Other | Attending: Emergency Medicine | Admitting: Emergency Medicine

## 2013-09-13 ENCOUNTER — Emergency Department (HOSPITAL_COMMUNITY): Payer: Medicare Other

## 2013-09-13 ENCOUNTER — Encounter (HOSPITAL_COMMUNITY): Payer: Self-pay | Admitting: Emergency Medicine

## 2013-09-13 DIAGNOSIS — G20 Parkinson's disease: Secondary | ICD-10-CM | POA: Insufficient documentation

## 2013-09-13 DIAGNOSIS — Z79899 Other long term (current) drug therapy: Secondary | ICD-10-CM | POA: Diagnosis not present

## 2013-09-13 DIAGNOSIS — S61209A Unspecified open wound of unspecified finger without damage to nail, initial encounter: Secondary | ICD-10-CM | POA: Diagnosis not present

## 2013-09-13 DIAGNOSIS — W1809XA Striking against other object with subsequent fall, initial encounter: Secondary | ICD-10-CM | POA: Diagnosis not present

## 2013-09-13 DIAGNOSIS — F3289 Other specified depressive episodes: Secondary | ICD-10-CM | POA: Insufficient documentation

## 2013-09-13 DIAGNOSIS — M79609 Pain in unspecified limb: Secondary | ICD-10-CM | POA: Diagnosis not present

## 2013-09-13 DIAGNOSIS — Y929 Unspecified place or not applicable: Secondary | ICD-10-CM | POA: Insufficient documentation

## 2013-09-13 DIAGNOSIS — R6889 Other general symptoms and signs: Secondary | ICD-10-CM | POA: Diagnosis not present

## 2013-09-13 DIAGNOSIS — Z8739 Personal history of other diseases of the musculoskeletal system and connective tissue: Secondary | ICD-10-CM | POA: Insufficient documentation

## 2013-09-13 DIAGNOSIS — G20A1 Parkinson's disease without dyskinesia, without mention of fluctuations: Secondary | ICD-10-CM | POA: Diagnosis not present

## 2013-09-13 DIAGNOSIS — S62609A Fracture of unspecified phalanx of unspecified finger, initial encounter for closed fracture: Secondary | ICD-10-CM | POA: Diagnosis not present

## 2013-09-13 DIAGNOSIS — I10 Essential (primary) hypertension: Secondary | ICD-10-CM | POA: Diagnosis not present

## 2013-09-13 DIAGNOSIS — F329 Major depressive disorder, single episode, unspecified: Secondary | ICD-10-CM | POA: Diagnosis not present

## 2013-09-13 DIAGNOSIS — S6990XA Unspecified injury of unspecified wrist, hand and finger(s), initial encounter: Secondary | ICD-10-CM | POA: Diagnosis not present

## 2013-09-13 DIAGNOSIS — S63259A Unspecified dislocation of unspecified finger, initial encounter: Secondary | ICD-10-CM

## 2013-09-13 DIAGNOSIS — R42 Dizziness and giddiness: Secondary | ICD-10-CM

## 2013-09-13 DIAGNOSIS — S6980XA Other specified injuries of unspecified wrist, hand and finger(s), initial encounter: Secondary | ICD-10-CM | POA: Insufficient documentation

## 2013-09-13 DIAGNOSIS — S63279A Dislocation of unspecified interphalangeal joint of unspecified finger, initial encounter: Secondary | ICD-10-CM | POA: Insufficient documentation

## 2013-09-13 DIAGNOSIS — Z7982 Long term (current) use of aspirin: Secondary | ICD-10-CM | POA: Diagnosis not present

## 2013-09-13 DIAGNOSIS — T148XXA Other injury of unspecified body region, initial encounter: Secondary | ICD-10-CM | POA: Diagnosis not present

## 2013-09-13 DIAGNOSIS — Y9389 Activity, other specified: Secondary | ICD-10-CM | POA: Insufficient documentation

## 2013-09-13 MED ORDER — HYDROCODONE-ACETAMINOPHEN 5-325 MG PO TABS: 2.0000 | ORAL_TABLET | ORAL | Status: DC | PRN

## 2013-09-13 MED ORDER — CEPHALEXIN 500 MG PO CAPS
500.0000 mg | ORAL_CAPSULE | Freq: Once | ORAL | Status: AC
  Administered 2013-09-13: 500 mg via ORAL
  Filled 2013-09-13: qty 1

## 2013-09-13 MED ORDER — MORPHINE SULFATE 4 MG/ML IJ SOLN
4.0000 mg | Freq: Once | INTRAMUSCULAR | Status: AC
  Administered 2013-09-13: 4 mg via INTRAVENOUS
  Filled 2013-09-13: qty 1

## 2013-09-13 MED ORDER — OXYCODONE-ACETAMINOPHEN 5-325 MG PO TABS: 2.0000 | ORAL_TABLET | Freq: Once | ORAL | Status: DC

## 2013-09-13 MED ORDER — CEPHALEXIN 500 MG PO CAPS: 500.0000 mg | ORAL_CAPSULE | Freq: Four times a day (QID) | ORAL | Status: DC

## 2013-09-13 MED ORDER — MORPHINE SULFATE 4 MG/ML IJ SOLN
4.0000 mg | Freq: Once | INTRAMUSCULAR | Status: AC
Start: 2013-09-13 — End: 2013-09-13
  Administered 2013-09-13: 4 mg via INTRAVENOUS
  Filled 2013-09-13: qty 1

## 2013-09-13 NOTE — Discharge Instructions (Signed)
Dislocation or Subluxation Dislocation of a joint occurs when ends of two or more adjacent bones no longer touch each other. A subluxation is a minor form of a dislocation, in which two or more adjacent bones are no longer properly aligned. The most common joints susceptible to a dislocation are the shoulder, kneecap, and fingers.  SYMPTOMS   Sudden pain at the time of injury.  Noticeable deformity in the area of the joint.  Limited range of motion. CAUSES   Usually a traumatic injury that stretches or tears ligaments that surround a joint and hold the bones together.  Condition present at birth (congenital) in which the joint surfaces are shallow or abnormally formed.  Joint disease such as arthritis or other diseases of ligaments and tissues around a joint. RISK INCREASES WITH:  Repeated injury to a joint.  Previous dislocation of a joint.  Contact sports (football, rugby, hockey, lacrosse) or sports that require repetitive overhead arm motion (throwing, swimming, volleyball).  Rheumatoid arthritis.  Congenital joint condition. PREVENTION  Warm up and stretch properly before activity.  Maintain physical fitness:  Joint flexibility.  Muscle strength and endurance.  Cardiovascular fitness.  Wear proper protective equipment and ensure correct fit.  Learn and use proper technique. PROGNOSIS  This condition is usually curable with prompt treatment. After the dislocation has been put back in place, the joint may require immobilization with a cast, splint, or sling for 2 to 6 weeks, often followed by strength and stretching exercises that may be performed at home or with a therapist. RELATED COMPLICATIONS   Damage to nearby nerves or major blood vessels, causing numbness, coldness, or paleness.  Recurrent injury to the joint.  Arthritis of affected joint.  Fracture of joint. TREATMENT Treatment initially involves realigning the bones (reduction) of the joint.  Reductions should only be performed by someone who is trained in the procedure. After the joint is reduced, medicine and ice should be used to reduce pain and inflammation. The joint may be immobilized to allow for the muscles and ligaments to heal. If a joint is subjected to recurrent dislocations, surgery may be necessary to tighten or replace injured structures. After surgery, stretching and strengthening exercises may be required. These may be performed at home or with a therapist. MEDICATION   Patients may require medicine to help them relax (sedative) or muscle relaxants in order to reduce the joint.  If pain medicine is necessary, nonsteroidal anti-inflammatory medicines, such as aspirin and ibuprofen, or other minor pain relievers, such as acetaminophen, are often recommended.  Do not take pain medicine for 7 days before surgery.  Prescription pain relievers may be necessary. Use only as directed and only as much as you need. SEEK MEDICAL CARE IF:   Symptoms get worse or do not improve despite treatment.  You have difficulty moving a joint after injury.  Any extremity becomes numb, pale, or cool after injury. This is an emergency.  Dislocations or subluxations occur repeatedly. Document Released: 12/26/2004 Document Revised: 03/20/2011 Document Reviewed: 04/09/2008 [facility] Patient Information 2015 [place], [place]. This information is not intended to replace advice given to you by your health care provider. Make sure you discuss any questions you have with your health care provider.

## 2013-09-13 NOTE — ED Notes (Addendum)
Pt is from [place].  Pt has parkinson disease and became dizzy, which he reports as normal. However today he fell up against his dresser and hit his left pinky. EMS reports that the pt has an open compound fracture with bone extending through skin. Pt is A/O and at baseline. Bleeding is controlled by a guaze dressing applied by EMS.

## 2013-09-13 NOTE — ED Notes (Signed)
[facility] called to take pt back to [facility].

## 2013-09-13 NOTE — ED Notes (Signed)
Ortho tech called for application of ulnar gutter splint.

## 2013-09-13 NOTE — ED Notes (Addendum)
Pt transported home with [facility]. Pt left a Robyn Nohr cane. It has been labeled with his name and given to the charge nurse

## 2013-09-13 NOTE — ED Provider Notes (Signed)
CSN: [id number]     Arrival date & time 09/13/13  1324 History   First MD Initiated Contact with Patient 09/13/13 1326     Chief Complaint  Patient presents with  . Fall  . Finger Injury     (Consider location/radiation/quality/duration/timing/severity/associated sxs/prior Treatment) HPI Brian Aguirre is a 80 y.o. male with history of Parkinson's, orthostatic hypotension, dizziness who presents today for evaluation of finger injury after sustaining a fall. He states that around 12:00 this afternoon he was standing in his bedroom and all of a sudden became dizzy, as if the room was spinning. This dizzy feeling has happened to him before. He lost his balance fell backwards and he somehow got his hand caught on the dresser. He is not dizzy now. He says when he looked at his hand his left pinky finger was bent to the side and the bone was sticking out. Also, the wife reports when he fell he may have hit his back and/or his head.  Takes 81 mg ASA, but no anticoagulation otherwise. He denies numbness, weakness, tingling.  Past Medical History  Diagnosis Date  . Parkinson disease   . Osteoporosis   . Dizziness   . Depression   . Hypertension   . Gait disturbance   . Benign enlargement of prostate   . Orthostatic hypotension   . Fracture, rib   . Memory loss    Past Surgical History  Procedure Laterality Date  . Appendectomy    . Tonsillectomy    . Hernia repair    . Cataract extraction Left    Family History  Problem Relation Age of Onset  . Cancer Mother   . Heart failure Father    History  Substance Use Topics  . Smoking status: Never Smoker   . Smokeless tobacco: Never Used  . Alcohol Use: No    Review of Systems  Constitutional: Negative for fever.  HENT: Negative for congestion.   Eyes: Negative for visual disturbance.  Respiratory: Negative for shortness of breath.   Cardiovascular: Negative for chest pain.  Gastrointestinal: Negative for abdominal pain.  Skin:  Positive for wound.       Broken left pinky      Allergies  Myrbetriq and Stalevo  Home Medications   Prior to Admission medications   Medication Sig Start Date End Date Taking? Authorizing Provider  aspirin EC 81 MG tablet Take 81 mg by mouth at bedtime.   Yes Historical Provider, MD  azelastine (ASTELIN) 0.1 % nasal spray Place 1 spray into both nostrils 2 (two) times daily as needed for rhinitis. Use in each nostril as directed   Yes Historical Provider, MD  carbidopa-levodopa (SINEMET IR) 25-250 MG per tablet Take 1 tablet by mouth 4 (four) times daily. Taken at 6 am , 9 am , 11:30 am and 2 pm   Yes Historical Provider, MD  docusate sodium (COLACE) 100 MG capsule Take 200 mg by mouth at bedtime.    Yes Historical Provider, MD  fish oil-omega-3 fatty acids 1000 MG capsule Take 1 g by mouth daily with breakfast.    Yes Historical Provider, MD  lisinopril (PRINIVIL,ZESTRIL) 5 MG tablet Take 5 mg by mouth every morning.    Yes Historical Provider, MD  lubiprostone (AMITIZA) 24 MCG capsule Take 24 mcg by mouth 2 (two) times daily with a meal.   Yes Historical Provider, MD  Multiple Vitamin (MULTIVITAMIN WITH MINERALS) TABS Take 1 tablet by mouth every morning.    Yes Historical  Provider, MD  pramipexole (MIRAPEX) 1.5 MG tablet Take 1.5 mg by mouth 3 (three) times daily. Taken at 6 am , 2 pm and bedtime   Yes Historical Provider, MD  Probiotic Product (ALIGN) 4 MG CAPS Take 1 capsule by mouth at bedtime.   Yes Historical Provider, MD  selegiline (ELDEPRYL) 5 MG tablet Take 5 mg by mouth 2 (two) times daily with a meal.   Yes Historical Provider, MD  vitamin B-12 (CYANOCOBALAMIN) 500 MCG tablet Take 500 mcg by mouth every morning.    Yes Historical Provider, MD  vitamin D, CHOLECALCIFEROL, 400 UNITS tablet Take 400 Units by mouth every morning.   Yes Historical Provider, MD   BP 169/70  Pulse 65  Temp(Src) 97.7 F (36.5 C) (Oral)  Resp 16  SpO2 100% Physical Exam  Nursing note and  vitals reviewed. Constitutional: He is oriented to person, place, and time.  Awake, alert, nontoxic appearance.  HENT:  Head: Atraumatic.  No lesions, wounds or contusions to head.  Eyes: Conjunctivae and EOM are normal. Right eye exhibits no discharge. Left eye exhibits no discharge.  Neck: Neck supple.  Pulmonary/Chest: Effort normal. He exhibits no tenderness.  Abdominal: Soft. There is no tenderness. There is no rebound.  Musculoskeletal: He exhibits no tenderness.  L pinky: Open wound with bone protruding from wound at the PIP joint. Hemostasis achieved. Upon reduction at bedside, neurovascularly intact. <2 sec cap refill.  No bony tenderness to spine. No paravertebral tenderness. No step offs on ribs appreciated.  Neurological: He is alert and oriented to person, place, and time.  Mental status and motor strength appears baseline for patient and situation.  Skin: No rash noted.  Psychiatric: He has a normal mood and affect.    ED Course  Procedures (including critical care time) Labs Review Labs Reviewed - No data to display  LACERATION REPAIR Performed by: Verl Dicker Authorized by: Verl Dicker Consent: Verbal consent obtained. Risks and benefits: risks, benefits and alternatives were discussed Consent given by: patient Patient identity confirmed: provided demographic data Prepped and Draped in normal sterile fashion Wound explored  Laceration Location: L 5th finger, medial  Laceration Length: 2 cm  No Foreign Bodies seen or palpated  Anesthesia: local infiltration  Local anesthetic: lidocaine 1%  w/o epinephrine  Anesthetic total: 4 ml  Irrigation method: syringe Amount of cleaning: standard 1L  Skin closure: 4-0 Prolene  Number of sutures: 4  Technique: Simple interrupted  Patient tolerance: Patient tolerated the procedure well with no immediate complications.   Imaging Review Dg Hand Complete Left  09/13/2013   CLINICAL DATA:  Fall  with fifth finger injury.  EXAM: LEFT HAND - COMPLETE 3+ VIEW  COMPARISON:  None.  FINDINGS: There is complete dislocation of the fifth middle phalanx in an ulnar direction with respect to the proximal phalanx. No definite fracture identified. Mild degenerative changes over the radial side of the wrist and first carpometacarpal joint as well as the interphalangeal joints.  IMPRESSION: Complete dislocation at the fifth PIP joint.  No definite fracture.   Electronically Signed   By: Marin Olp M.D.   On: 09/13/2013 14:14     EKG Interpretation None     Meds given in ED:  Medications  morphine 4 MG/ML injection 4 mg (4 mg Intravenous Given 09/13/13 1415)  cephALEXin (KEFLEX) capsule 500 mg (500 mg Oral Given 09/13/13 1534)  morphine 4 MG/ML injection 4 mg (4 mg Intravenous Given 09/13/13 1548)    New  Prescriptions   No medications on file   Filed Vitals:   09/13/13 1535  BP: 169/70  Pulse: 65  Temp:   Resp: 16    MDM  Vitals stable - WNL -afebrile Pt resting comfortably in ED. Present when Dr. Venora Maples reduced finger at bedside. Xray showed Dislocation of PIP L 5th finger. no sign of frx.  Sutured laceration with hemostasis achieved.  Neurovascularly intact post reduction. Pt will fu with Dr. Burney Gauze Tuesday for further eval and management. Will DC with Keflex and vicodin for pain management until appt with Ortho Discussed f/u with PCP and return precautions, pt very amenable to plan.   Final diagnoses:  Dizziness  Finger dislocation, initial encounter  Prior to patient discharge, I discussed and reviewed this case with [place], PA-C 09/13/13 2141  Verl Dicker, PA-C 09/13/13 2152

## 2013-09-14 NOTE — ED Provider Notes (Signed)
Medical screening examination/treatment/procedure(s) were conducted as a shared visit with non-physician practitioner(s) and myself.  I personally evaluated the patient during the encounter.   EKG Interpretation   Date/Time:  Saturday September 13 2013 13:30:33 EDT Ventricular Rate:  66 PR Interval:  221 QRS Duration: 123 QT Interval:  460 QTC Calculation: 482 R Axis:   -45 Text Interpretation:  Sinus rhythm Prolonged PR interval Left bundle  branch block No old tracing to compare Confirmed by Kiyra Slaubaugh  MD, Lennette Bihari  ([id number]) on 09/14/2013 8:04:45 AM      Reduction of dislocation Performed by: Hoy Morn Consent: Verbal consent obtained. Risks and benefits: risks, benefits and alternatives were discussed Consent given by: patient Required items: required blood products, implants, devices, and special equipment available Time out: Immediately prior to procedure a "time out" was called to verify the correct patient, procedure, equipment, support staff and site/side marked as required. Vitals: Vital signs were monitored during sedation. patient tolerance: Patient tolerated the procedure well with no immediate complications. Joint: left little finger PIP joint Reduction technique: manipulation  SPLINT APPLICATION Authorized by: Hoy Morn Consent: Verbal consent obtained. Risks and benefits: risks, benefits and alternatives were discussed Consent given by: patient Splint applied by: orthopedic technician Location details: left ulnar gutter Splint type: left ulnar gutter Supplies used: orthoglass Post-procedure: The splinted body part was neurovascularly unchanged following the procedure. Patient tolerance: Patient tolerated the procedure well with no immediate complications.   Patient with open dislocation of his left little finger.  This was reduced at the bedside.  Laceration was repaired.  This was cleaned out extensively at bedside with copious irrigation.  Patient will be  placed on Keflex.  I personally discussed the patient's case with our hand surgeon Dr. Burney Gauze who asked that the patient be closed at the bedside after irrigation.  Dr. Burney Gauze will followup on the patient in the office in 3 days.  Infection warnings given.  Discharge home in good condition.  This was not a grossly contaminated open wound     Hoy Morn, MD 09/14/13 [phone]

## 2013-09-14 NOTE — Progress Notes (Signed)
CARE MANAGEMENT NOTE 09/14/2013  Patient:  Brian Aguirre, Brian Aguirre   Account Number:  [id number]  Date Initiated:  09/14/2013  Documentation initiated by:  [facility]  Subjective/Objective Assessment:   Parkinson's     Action/Plan:   lives at [place] apt   Anticipated DC Date:  09/13/2013   Anticipated DC Plan:  [place]  CM consult      Choice offered to / List presented to:             Status of service:  Completed, signed off Medicare Important Message given?   (If response is "NO", the following Medicare IM given date fields will be blank) Date Medicare IM given:   Medicare IM given by:   Date Additional Medicare IM given:   Additional Medicare IM given by:    Discharge Disposition:  HOME/SELF CARE  Per UR Regulation:    If discussed at Long Length of Stay Meetings, dates discussed:    Comments:  09/14/2013 1100 NCM spoke to dtr, Stephan Minister # [phone]. Dtr states she had questions in regards to SNF- rehab. They currently have CNA that comes from 7 am -10:30 pm to stay with pt and their mother at the [facility]. Explained that hospital could order [facility] PT to come out to eval and tx. Dtr states they will arrange appt on 9/8 to follow up with Orthopedist. NCM explained to dtr she can also speak the coordinator at [facility] rehab about bed openings at their facility. Explained to dtr pt's Orthopedic Physician can also give orders for [facility] PT or SNF-rehab. States they are still undecided because they really want pt to stay in his apt. NCM educated the importance of safety and avoiding falls for pt. Will follow up with Orthopedic MD on wheelchair, adaptive device for RW and HH/SNF at the appt next week. Dtr has NCM contact number and she plans to call if she has any additional questions. Jonnie Finner RN CCM Case Mgmt phone [phone]

## 2013-09-16 ENCOUNTER — Telehealth: Payer: Self-pay | Admitting: Neurology

## 2013-09-16 ENCOUNTER — Encounter: Payer: Self-pay | Admitting: Neurology

## 2013-09-16 DIAGNOSIS — S63279A Dislocation of unspecified interphalangeal joint of unspecified finger, initial encounter: Secondary | ICD-10-CM | POA: Diagnosis not present

## 2013-09-16 DIAGNOSIS — N3941 Urge incontinence: Secondary | ICD-10-CM

## 2013-09-16 NOTE — Telephone Encounter (Signed)
Patient's daughter Eustaquio Maize @ [phone], regarding fall over the weekend.  Questioning if it effected Physical Therapy.  Please call return today before 4:00 pm.

## 2013-09-16 NOTE — Telephone Encounter (Signed)
I called the daughter. The patient dislocated the fifth finger on the left hand over the weekend. As soon as possible, the patient should get into physical therapy once he is able to use his hand to hold and grasp the walker. I'll send a prescription for the condom catheters, and I'll dictate a letter for the insurance.

## 2013-09-16 NOTE — Telephone Encounter (Signed)
Spoke to patient's daughter in regards to a number of questions/issues.  First she wanted to relay that the patient fell on Saturday and dislocated his little finger on left hand, and the bone penetrated the skin, it is now in a split and in a cast up to the elbow.  He is receiving home care but she wants to know what can be done about the physical therapy.  They want the best for him and can get him to [facility] if the doctor feels that will work.  The next issue is a request for a prescription for condom catheters, sent to [facility] supply on [address], so Medicare will pay for catheters.  Also she is requesting a letter stating that the patient would be best served if he could get his medicine from the local pharmacy [facility] mail order.  The mail order is always late in getting him his medications, she just isn't sure what is needed on their end and who it would have to be sent to.  She can be reached at [phone].

## 2013-09-17 ENCOUNTER — Telehealth: Payer: Self-pay | Admitting: Neurology

## 2013-09-17 ENCOUNTER — Telehealth: Payer: Self-pay | Admitting: *Deleted

## 2013-09-17 NOTE — Telephone Encounter (Signed)
Patient's daughter Eustaquio Maize calling Sonya back. [phone]

## 2013-09-17 NOTE — Telephone Encounter (Signed)
Patient's daughter request that we hold on to letter until she finds out where it needs to go. She will call back with a fax number.

## 2013-09-17 NOTE — Telephone Encounter (Signed)
[name] has written letter for ins. I need to know if she wants to pick it up, have it faxed or mailed.

## 2013-09-18 ENCOUNTER — Telehealth: Payer: Self-pay | Admitting: Neurology

## 2013-09-18 MED ORDER — LUBIPROSTONE 24 MCG PO CAPS: 24.0000 ug | ORAL_CAPSULE | Freq: Two times a day (BID) | ORAL | Status: DC

## 2013-09-18 NOTE — Telephone Encounter (Signed)
Rx has been resent to [facility] per patient request.  I called back.  They are aware.

## 2013-09-18 NOTE — Telephone Encounter (Signed)
[name] calling to state that they ordered Amitiza through [employer] but never received the order. She did contact [employer] who said it was delivered to their address at [place] 8:00 am on August 29. In any event, they did receive it and wife wants the Rx to go to [facility] if at all possible so they can pick it up tomorrow. Best number to [phone] and it is okay to leave a message. Please let them know either way.

## 2013-09-19 DIAGNOSIS — Z23 Encounter for immunization: Secondary | ICD-10-CM | POA: Diagnosis not present

## 2013-09-19 DIAGNOSIS — S63279A Dislocation of unspecified interphalangeal joint of unspecified finger, initial encounter: Secondary | ICD-10-CM | POA: Diagnosis not present

## 2013-09-19 DIAGNOSIS — R079 Chest pain, unspecified: Secondary | ICD-10-CM | POA: Diagnosis not present

## 2013-09-19 DIAGNOSIS — I10 Essential (primary) hypertension: Secondary | ICD-10-CM | POA: Diagnosis not present

## 2013-09-19 NOTE — Telephone Encounter (Signed)
Father saw the Ortho hand specialist on Tuesday and because [name] has ordered PT already she requested that the Ortho send the information over and she states we have to request it. From Office of Orthopedic hand specialist, Dr. Benay Spice [phone]. States she spoke to [employer]

## 2013-09-22 DIAGNOSIS — I10 Essential (primary) hypertension: Secondary | ICD-10-CM | POA: Diagnosis not present

## 2013-09-22 NOTE — Telephone Encounter (Signed)
Spoke to patient's daughter and she would like [place] to request the records from Dr. Farris Has office.  Also once [name] has reviewed records they would like physical therapy to be notified if the patient has any restrictions due to his hand.  I have contacted Dr. Bertis Ruddy office and they will fax note once he signs off on it.

## 2013-09-22 NOTE — Telephone Encounter (Signed)
Left patients daughter a message clarifying if we need to request records from hand surgeon.

## 2013-09-24 NOTE — Telephone Encounter (Signed)
Received the office note for patient from hand specialist.  I will give to doctor for review to see if there will be any limitations for the patient's PT.

## 2013-09-24 NOTE — Telephone Encounter (Signed)
I called the daughter. The only restriction would be whether or not the patient could grasp a walker. Hopefully, given the fifth finger injury, if the bandages not too cumbersome, the patient should be a will to use his hand for grasping. No other restrictions will be applicable.

## 2013-09-29 ENCOUNTER — Telehealth: Payer: Self-pay | Admitting: *Deleted

## 2013-09-29 ENCOUNTER — Ambulatory Visit: Payer: Medicare Other | Admitting: Physical Therapy

## 2013-09-29 NOTE — Telephone Encounter (Signed)
Pt calling for refill on Amitiza 40mcg  1 cap po bid.  States has gotten from [facility].  [place] sometimes.  Was told by [employer] to call us.  He states is using as directed.   Needing 30 day supply.  Will forward to pharmacy tech.

## 2013-09-30 ENCOUNTER — Telehealth: Payer: Self-pay | Admitting: Neurology

## 2013-09-30 DIAGNOSIS — S63279A Dislocation of unspecified interphalangeal joint of unspecified finger, initial encounter: Secondary | ICD-10-CM | POA: Diagnosis not present

## 2013-09-30 NOTE — Telephone Encounter (Signed)
Patient's daughter calling to give Brian Aguirre more information about patient's insurance and medication, please return call and advise.

## 2013-09-30 NOTE — Telephone Encounter (Signed)
Called pt's and spoke with pt's wife Constance Holster informing her that the pt's Rx was sent on 09/18/13 with 3 refills and that they needed to call [facility] to request a refill. I advised the wife that if the pt has any other problems, questions or concerns to call the office. The wife verbalized understanding.

## 2013-09-30 NOTE — Telephone Encounter (Signed)
Letter faxed to [employer] @ [phone].

## 2013-10-03 DIAGNOSIS — M6281 Muscle weakness (generalized): Secondary | ICD-10-CM | POA: Diagnosis not present

## 2013-10-07 DIAGNOSIS — M6281 Muscle weakness (generalized): Secondary | ICD-10-CM | POA: Diagnosis not present

## 2013-10-08 ENCOUNTER — Ambulatory Visit: Payer: Medicare Other | Attending: Neurology | Admitting: Physical Therapy

## 2013-10-08 DIAGNOSIS — G20 Parkinson's disease: Secondary | ICD-10-CM | POA: Diagnosis not present

## 2013-10-08 DIAGNOSIS — IMO0001 Reserved for inherently not codable concepts without codable children: Secondary | ICD-10-CM | POA: Diagnosis not present

## 2013-10-08 DIAGNOSIS — R269 Unspecified abnormalities of gait and mobility: Secondary | ICD-10-CM | POA: Diagnosis not present

## 2013-10-08 DIAGNOSIS — G20A1 Parkinson's disease without dyskinesia, without mention of fluctuations: Secondary | ICD-10-CM | POA: Diagnosis not present

## 2013-10-09 DIAGNOSIS — M6281 Muscle weakness (generalized): Secondary | ICD-10-CM | POA: Diagnosis not present

## 2013-10-14 ENCOUNTER — Telehealth: Payer: Self-pay | Admitting: Neurology

## 2013-10-14 NOTE — Telephone Encounter (Signed)
Auto refill typically means the Rx's would be automatically filled, and the patient would be automatically billed for them without the pharmacy first speaking to the patient prior to each fill/charge.  I called back.  Got no answer.  Left message advising this would be a decision made by the patient. Asked that she call us back if she has any further questions.

## 2013-10-14 NOTE — Telephone Encounter (Signed)
Metro Kung with [facility] @ [phone], received correspondence regarding patients home delivery.  Questioning if medication could be auto refills.  Please call and advise.

## 2013-10-16 ENCOUNTER — Telehealth: Payer: Self-pay | Admitting: Neurology

## 2013-10-16 DIAGNOSIS — I959 Hypotension, unspecified: Secondary | ICD-10-CM | POA: Diagnosis not present

## 2013-10-16 NOTE — Telephone Encounter (Signed)
Metro Kung with [facility] @ [phone], called and stated patient has decided not to use Auto refills. FYI

## 2013-10-20 ENCOUNTER — Ambulatory Visit: Payer: Medicare Other | Attending: Neurology | Admitting: Physical Therapy

## 2013-10-20 DIAGNOSIS — R269 Unspecified abnormalities of gait and mobility: Secondary | ICD-10-CM | POA: Diagnosis not present

## 2013-10-20 DIAGNOSIS — R42 Dizziness and giddiness: Secondary | ICD-10-CM | POA: Insufficient documentation

## 2013-10-20 DIAGNOSIS — R258 Other abnormal involuntary movements: Secondary | ICD-10-CM | POA: Insufficient documentation

## 2013-10-20 DIAGNOSIS — R2689 Other abnormalities of gait and mobility: Secondary | ICD-10-CM | POA: Insufficient documentation

## 2013-10-20 DIAGNOSIS — G20 Parkinson's disease: Secondary | ICD-10-CM | POA: Insufficient documentation

## 2013-10-22 ENCOUNTER — Ambulatory Visit: Payer: Medicare Other | Admitting: Physical Therapy

## 2013-10-22 DIAGNOSIS — R269 Unspecified abnormalities of gait and mobility: Secondary | ICD-10-CM | POA: Diagnosis not present

## 2013-10-22 DIAGNOSIS — G20 Parkinson's disease: Secondary | ICD-10-CM | POA: Diagnosis not present

## 2013-10-27 ENCOUNTER — Ambulatory Visit: Payer: Medicare Other | Admitting: Physical Therapy

## 2013-10-27 DIAGNOSIS — G20 Parkinson's disease: Secondary | ICD-10-CM | POA: Diagnosis not present

## 2013-10-27 DIAGNOSIS — R269 Unspecified abnormalities of gait and mobility: Secondary | ICD-10-CM | POA: Diagnosis not present

## 2013-10-28 DIAGNOSIS — M6281 Muscle weakness (generalized): Secondary | ICD-10-CM | POA: Diagnosis not present

## 2013-10-30 DIAGNOSIS — K219 Gastro-esophageal reflux disease without esophagitis: Secondary | ICD-10-CM | POA: Diagnosis not present

## 2013-10-30 DIAGNOSIS — R609 Edema, unspecified: Secondary | ICD-10-CM | POA: Diagnosis not present

## 2013-10-30 DIAGNOSIS — K5900 Constipation, unspecified: Secondary | ICD-10-CM | POA: Diagnosis not present

## 2013-11-03 ENCOUNTER — Ambulatory Visit: Payer: Medicare Other | Admitting: Physical Therapy

## 2013-11-03 DIAGNOSIS — G20 Parkinson's disease: Secondary | ICD-10-CM | POA: Diagnosis not present

## 2013-11-03 DIAGNOSIS — R269 Unspecified abnormalities of gait and mobility: Secondary | ICD-10-CM | POA: Diagnosis not present

## 2013-11-04 ENCOUNTER — Ambulatory Visit (INDEPENDENT_AMBULATORY_CARE_PROVIDER_SITE_OTHER): Payer: Medicare Other | Admitting: Neurology

## 2013-11-04 ENCOUNTER — Encounter: Payer: Self-pay | Admitting: Neurology

## 2013-11-04 ENCOUNTER — Telehealth: Payer: Self-pay | Admitting: Neurology

## 2013-11-04 VITALS — BP 171/83 | HR 73 | Ht 71.0 in | Wt 167.4 lb

## 2013-11-04 DIAGNOSIS — G20 Parkinson's disease: Secondary | ICD-10-CM | POA: Diagnosis not present

## 2013-11-04 DIAGNOSIS — R269 Unspecified abnormalities of gait and mobility: Secondary | ICD-10-CM

## 2013-11-04 NOTE — Telephone Encounter (Signed)
Spoke to patient's daughter Lucita Ferrara and she wanted the doctor to know that her father saw Dr. Felipa Eth who put the patient on Lasix last week, but her dad hadn't taken it yet.  She believes it may have to do with something the doctor saw in urinalysis results.  Also she wants to make sure it doesn't interfere with his other meds.  I told her I would let the doctor know before her dad comes for his appointment today.  I also relayed that I did not received the form from [facility], she will have to check with our medical records dept.  They will need authorization and payment before form can be filled out.

## 2013-11-04 NOTE — Telephone Encounter (Signed)
The patient was seen in the office today, these issues were discussed.

## 2013-11-04 NOTE — Patient Instructions (Signed)
Parkinson Disease Parkinson disease is a disorder of the central nervous system, which includes the brain and spinal cord. A person with this disease slowly loses the ability to completely control body movements. Within the brain, there is a group of nerve cells (basal ganglia) that help control movement. The basal ganglia are damaged and do not work properly in a person with Parkinson disease. In addition, the basal ganglia produce and use a brain chemical called dopamine. The dopamine chemical sends messages to other parts of the body to control and coordinate body movements. Dopamine levels are low in a person with Parkinson disease. If the dopamine levels are low, then the body does not receive the correct messages it needs to move normally.  CAUSES  The exact reason why the basal ganglia get damaged is not known. Some medical researchers have thought that infection, genes, environment, and certain medicines may contribute to the cause.  SYMPTOMS   An early symptom of Parkinson disease is often an uncontrolled shaking (tremor) of the hands. The tremor will often disappear when the affected hand is consciously used.  As the disease progresses, walking, talking, getting out of a chair, and new movements become more difficult.  Muscles get stiff and movements become slower.  Balance and coordination become harder.  Depression, trouble swallowing, urinary problems, constipation, and sleep problems can occur.  Later in the disease, memory and thought processes may deteriorate. DIAGNOSIS  There are no specific tests to diagnose Parkinson disease. You may be referred to a neurologist for evaluation. Your caregiver will ask about your medical history, symptoms, and perform a physical exam. Blood tests and imaging tests of your brain may be performed to rule out other diseases. The imaging tests may include an MRI or a CT scan. TREATMENT  The goal of treatment is to relieve symptoms. Medicines may be  prescribed once the symptoms become troublesome. Medicine will not stop the progression of the disease, but medicine can make movement and balance better and help control tremors. Speech and occupational therapy may also be prescribed. Sometimes, surgical treatment of the brain can be done in young people. HOME CARE INSTRUCTIONS  Get regular exercise and rest periods during the day to help prevent exhaustion and depression.  If getting dressed becomes difficult, replace buttons and zippers with Velcro and elastic on your clothing.  Take all medicine as directed by your caregiver.  Install grab bars or railings in your home to prevent falls.  Go to speech or occupational therapy as directed.  Keep all follow-up visits as directed by your caregiver. SEEK MEDICAL CARE IF:  Your symptoms are not controlled with your medicine.  You fall.  You have trouble swallowing or choke on your food. MAKE SURE YOU:  Understand these instructions.  Will watch your condition.  Will get help right away if you are not doing well or get worse. Document Released: 12/24/1999 Document Revised: 04/22/2012 Document Reviewed: 01/25/2011 ExitCare Patient Information 2015 [employer]. This information is not intended to replace advice given to you by your health care provider. Make sure you discuss any questions you have with your health care provider.  

## 2013-11-04 NOTE — Progress Notes (Signed)
Reason for visit: Parkinson's disease  Brian Aguirre is an 80 y.o. male  History of present illness:  Brian Aguirre is an 80 year old right-handed white male with a history of Parkinson's disease. The patient has had a relapse in his severe constipation issues over the last several days, and this has negatively impacted his functional ability with the Parkinson's disease. The patient is having increasing problems getting out of a chair, and walking. The patient uses a walker for ambulation. He has not had any recent falls, and he continues to drool. The patient is on Sinemet taking the 25/250 mg tablet 4 times daily and in between these doses he is taking the 25/100 mg Sinemet tablet, 3 a day. He is also on selegiline. He has been seen by Dr. Felipa Eth, and he was placed on ranitidine. The patient was on Amitiza, but it was felt that this was causing problems with swelling in the ankles. The patient was told to go on a combination of MiraLAX and Senokot. The patient returns to this office for an evaluation. He has been placed on Lasix. The patient is having occasional hallucinations at times. This is not a frequent event. He has had some elevations in blood pressure.   Past Medical History  Diagnosis Date  . Parkinson disease   . Osteoporosis   . Dizziness   . Depression   . Hypertension   . Gait disturbance   . Benign enlargement of prostate   . Orthostatic hypotension   . Fracture, rib   . Memory loss     Past Surgical History  Procedure Laterality Date  . Appendectomy    . Tonsillectomy    . Hernia repair    . Cataract extraction Left     Family History  Problem Relation Age of Onset  . Cancer Mother   . Heart failure Father     Social history:  reports that he has never smoked. He has never used smokeless tobacco. He reports that he does not drink alcohol or use illicit drugs.    Allergies  Allergen Reactions  . Myrbetriq [Mirabegron] Nausea And Vomiting  . Stalevo  [Carbidopa-Levodopa-Entacapone] Diarrhea    Medications:  Current Outpatient Prescriptions on File Prior to Visit  Medication Sig Dispense Refill  . aspirin EC 81 MG tablet Take 81 mg by mouth at bedtime.      . carbidopa-levodopa (SINEMET IR) 25-250 MG per tablet Take 1.5 tablets by mouth 4 (four) times daily. Taken at 6 am , 9 am , 11:30 am and 2 pm      . docusate sodium (COLACE) 100 MG capsule Take 200 mg by mouth at bedtime.       . fish oil-omega-3 fatty acids 1000 MG capsule Take 1 g by mouth daily with breakfast.       . lubiprostone (AMITIZA) 24 MCG capsule Take 1 capsule (24 mcg total) by mouth 2 (two) times daily with a meal.  60 capsule  3  . Multiple Vitamin (MULTIVITAMIN WITH MINERALS) TABS Take 1 tablet by mouth every morning.       . pramipexole (MIRAPEX) 1.5 MG tablet Take 1.5 mg by mouth 3 (three) times daily. Taken at 6 am , 2 pm and bedtime      . Probiotic Product (ALIGN) 4 MG CAPS Take 1 capsule by mouth at bedtime.      . selegiline (ELDEPRYL) 5 MG tablet Take 5 mg by mouth 2 (two) times daily with a meal.      .  vitamin B-12 (CYANOCOBALAMIN) 500 MCG tablet Take 500 mcg by mouth every morning.       . vitamin D, CHOLECALCIFEROL, 400 UNITS tablet Take 400 Units by mouth every morning.       No current facility-administered medications on file prior to visit.    ROS:  Out of a complete 14 system review of symptoms, the patient complains only of the following symptoms, and all other reviewed systems are negative.  Running nose, drooling Double vision Cough Leg swelling Constipation Frequency of urination Back pain Bruising easily Dizziness, speech difficulty, tremors Hallucinations  Blood pressure 171/83, pulse 73, height 5\' 11"  (1.803 m), weight 167 lb 6.4 oz (75.932 kg).  Physical Exam  General: The patient is alert and cooperative at the time of the examination.  Skin: No significant peripheral edema is noted.   Neurologic Exam  Mental status: The  patient is oriented x 3.  Cranial nerves: Facial symmetry is present. Masking of the face is seen.  Speech is dysphonic, the patient is drooling. Extraocular movements are full. Visual fields are full.  Motor: The patient has good strength in all 4 extremities.  Sensory examination: Soft touch sensation is symmetric on the face, arms, and legs.   Coordination: The patient has good finger-nose-finger and heel-to-shin bilaterally.  Gait and station: The patient has difficulty arising from a chair, requires assistance. Once up, he will have a tendency to lean backwards. The patient has difficulty with turns, he is able to walk with a walker fairly well when walking straight. With sitting, he tends to lean back words and to the right.   Reflexes: Deep tendon reflexes are symmetric.   Assessment/Plan:  1. Parkinson's disease   2. Gait disorder  3. Constipation  The patient will try Linzess again, and if this is not effective, we may try Movantik. The patient will be kept at his current dose of Sinemet, but if his functional ability remains poor after the constipation issues have been resolved, we may go up on the Sinemet taking the 25/250 tablet 4 times daily, and a half tablet between these doses 4 times daily. He will follow-up in 3 months.   Jill Alexanders MD 11/04/2013 7:43 PM  [facility] [address] [place], [place] [address]  Phone [phone] Fax [phone]

## 2013-11-04 NOTE — Telephone Encounter (Signed)
Patient's daughter, Maudie Flakes @ [phone], calling regarding patient has experienced swelling of ankles.  Patient saw PCP Dr. Felipa Eth, he prescribed Lasix after urine test.  Daughter questioning if this medication would interfere with his other medications prescribed.  She also stated, [facility] facility has previously faxed a form for Home and Long term care, which would assist in helping patient get dress and helping out of chairs.  [facility] hadn't heard back on their end.

## 2013-11-05 ENCOUNTER — Telehealth: Payer: Self-pay | Admitting: *Deleted

## 2013-11-05 ENCOUNTER — Ambulatory Visit: Payer: Medicare Other

## 2013-11-05 DIAGNOSIS — Z0289 Encounter for other administrative examinations: Secondary | ICD-10-CM

## 2013-11-05 NOTE — Telephone Encounter (Signed)
Form,[facility] to [facility] for completion 11-05-13.

## 2013-11-10 ENCOUNTER — Ambulatory Visit: Payer: Medicare Other | Attending: Neurology | Admitting: Physical Therapy

## 2013-11-10 ENCOUNTER — Encounter: Payer: Self-pay | Admitting: Physical Therapy

## 2013-11-10 DIAGNOSIS — R413 Other amnesia: Secondary | ICD-10-CM | POA: Insufficient documentation

## 2013-11-10 DIAGNOSIS — G20 Parkinson's disease: Secondary | ICD-10-CM | POA: Diagnosis not present

## 2013-11-10 DIAGNOSIS — I10 Essential (primary) hypertension: Secondary | ICD-10-CM | POA: Insufficient documentation

## 2013-11-10 DIAGNOSIS — R42 Dizziness and giddiness: Secondary | ICD-10-CM | POA: Insufficient documentation

## 2013-11-10 DIAGNOSIS — R269 Unspecified abnormalities of gait and mobility: Secondary | ICD-10-CM | POA: Diagnosis not present

## 2013-11-10 DIAGNOSIS — I951 Orthostatic hypotension: Secondary | ICD-10-CM | POA: Insufficient documentation

## 2013-11-10 DIAGNOSIS — M810 Age-related osteoporosis without current pathological fracture: Secondary | ICD-10-CM | POA: Insufficient documentation

## 2013-11-10 NOTE — Therapy (Signed)
Physical Therapy Treatment  Patient Details  Name: Brian Aguirre MRN: [id number] Date of Birth: 1929-11-16  Encounter Date: 11/10/2013      PT End of Session - 11/10/13 1154    Visit Number 6   Number of Visits 17   Date for PT Re-Evaluation 12/07/13   PT Start Time 1116   PT Stop Time 1155   PT Time Calculation (min) 39 min      Past Medical History  Diagnosis Date  . Parkinson disease   . Osteoporosis   . Dizziness   . Depression   . Hypertension   . Gait disturbance   . Benign enlargement of prostate   . Orthostatic hypotension   . Fracture, rib   . Memory loss     Past Surgical History  Procedure Laterality Date  . Appendectomy    . Tonsillectomy    . Hernia repair    . Cataract extraction Left     There were no vitals taken for this visit.  Visit Diagnosis:  Abnormality of gait          Adult PT Treatment/Exercise - 11/10/13 0700    Transfers Sit to Stand;Stand to Sit   Sit to Stand 5: Supervision;With upper extremity assist   Sit to Stand Details (indicate cue type and reason) Cues for scooting, handplacement, momentum; 10 reps from 20 inch surface no UE support; 10 reps from 18 inch surface, minimal UE support   Stand to Sit 5: Supervision;With upper extremity assist   Ambulation/Gait Yes   Ambulation/Gait Assistance 5: Supervision   Ambulation/Gait Assistance Details Pt requires verbal cues for large amplitude step length   Ambulation Distance (Feet) 100 Feet  100 ft, 2 reps   Assistive device Rolling walker   Gait Pattern --  decreased foot clearance, decreased heelstrike   Standardized Balance Assessment Timed Up and Go Test;Berg Balance Test   Sit to Stand Able to stand  independently using hands   Standing Unsupported Able to stand safely 2 minutes   Sitting with Back Unsupported but Feet Supported on Floor or Stool Able to sit safely and securely 2 minutes   Stand to Sit Controls descent by using hands   Transfers Able to transfer  safely, definite need of hands   Standing Unsupported with Eyes Closed Able to stand 10 seconds with supervision   Standing Ubsupported with Feet Together Needs help to attain position but able to stand for 30 seconds with feet together   From Standing, Reach Forward with Outstretched Arm Can reach forward >12 cm safely (5")   From Standing Position, Pick up Object from Floor Able to pick up shoe, needs supervision   From Standing Position, Turn to Look Behind Over each Shoulder Looks behind one side only/other side shows less weight shift   Turn 360 Degrees Needs close supervision or verbal cueing   Standing Unsupported, Alternately Place Feet on Step/Stool Needs assistance to keep from falling or unable to try   Standing Unsupported, One Foot in Front Needs help to step but can hold 15 seconds   Standing on One Leg Unable to try or needs assist to prevent fall   Total Score 32   TUG Normal TUG   Normal TUG (seconds) 32.22            PT Short Term Goals - 11/10/13 1127    Title Pt will be able to perform HEP with wife's supervision, for improved balance, transfers, and gait.   Status Not  Met   Title Pt will perform at least 6 of 10 reps of sit<>stand transfers with minimal to no UE support, for improved safety and efficiency with transfers.   Status Achieved   Title Pt will improve Berg Balance score to at least 26/56 for decreased fall risk.   Status Achieved  Berg score 32/56   Title Pt will improve TUG score to less tahn or equal to 45 seconds for decreased fall risk.   Status --  TUG score 32.22 sec   Title Pt will report at least 25% improvement in bed mobility, including rolling and supine<>sit, for improved functional mobility.   Status Not Met          PT Long Term Goals - 11/10/13 1225    Title Pt will be able to verbalize understanding of fall prevention strategies within home environment.   Time 4   Period Weeks   Status On-going   Title Pt will be able to  perform at least 8 of 10 reps of sit<>stand transfers wtih no UE support, for improved safety and efficiency with transfers.   Time 4   Period Weeks   Status On-going   Title Pt will be able to improve Berg Balance score to at least 31/56 for decreased fall risk.   Status Achieved  Berg 32/56   Title Pt will be able to improve TUG score to less than or equal to 35 seconds for decreased fall risk.   Status Achieved  TUG 32.22 sec   Title Pt will be to improve gait velocity to a tleast 2.62 ft/sec for improved efficiency and safety with gait.   Time 4   Period Weeks   Status On-going   Additional Long Term Goals Yes   Title verbalize plans for continued community fitness upon D/C from PT.   Time 4   Period Weeks   Status On-going   Title improve Berg Balance score to at least 37/56 for decreased fall risk.   Time 4   Period Weeks   Status New   Title improve TUG score to less than or equal to 27 seconds for decreased fall risk.   Time 4   Period Weeks   Status New          Plan - 11/10/13 1159    Clinical Impression Statement Pt has met STG #2, #3, #4.  STG #1 and #5 not met.  Pt reports not performing exercises regularly and reports bed moblity is unchanged.   PT Plan Dynamic gait activities, turns with RW, single limb stance and weightshifting activities; add to HEP        Problem List Patient Active Problem List   Diagnosis Date Noted  . Urgency of urination 02/15/2012  . Orthostatic hypotension 02/15/2012  . Dizziness and giddiness 02/15/2012  . Abnormality of gait 02/15/2012  . Paralysis agitans 02/15/2012                                            Lashann Hagg W. 11/10/2013, 1:25 PM

## 2013-11-12 ENCOUNTER — Ambulatory Visit: Payer: Medicare Other | Admitting: Physical Therapy

## 2013-11-12 ENCOUNTER — Encounter: Payer: Self-pay | Admitting: Physical Therapy

## 2013-11-12 DIAGNOSIS — R269 Unspecified abnormalities of gait and mobility: Secondary | ICD-10-CM | POA: Diagnosis not present

## 2013-11-12 NOTE — Therapy (Signed)
Physical Therapy Treatment  Patient Details  Name: Brian Aguirre MRN: [id number] Date of Birth: 1929/01/27  Encounter Date: 11/12/2013      PT End of Session - 11/12/13 1339    Visit Number 7   Number of Visits 17   Date for PT Re-Evaluation 12/07/13   PT Start Time 1125   PT Stop Time 1150   PT Time Calculation (min) 25 min      Past Medical History  Diagnosis Date  . Parkinson disease   . Osteoporosis   . Dizziness   . Depression   . Hypertension   . Gait disturbance   . Benign enlargement of prostate   . Orthostatic hypotension   . Fracture, rib   . Memory loss     Past Surgical History  Procedure Laterality Date  . Appendectomy    . Tonsillectomy    . Hernia repair    . Cataract extraction Left     There were no vitals taken for this visit.  Visit Diagnosis:  Abnormality of gait          Adult PT Treatment/Exercise - 11/12/13 0700    Ambulation/Gait   Ambulation/Gait Yes   Ambulation/Gait Assistance 5: Supervision   Ambulation/Gait Assistance Details Pt requires verbal cues for large amplitude step length  Pt re   Ambulation Distance (Feet) 100 Feet  times 2 reps   Assistive device Rolling walker   High Level Balance   High Level Balance Activities Turns   High Level Balance Comments in parallel bars with min UE assist                Plan - 11/12/13 1340    Pt will benefit from skilled therapeutic intervention in order to improve on the following deficits Decreased balance;Abnormal gait;Difficulty walking   Rehab Potential Good   PT Plan Add single limb stance and weightshifting to HEP.  Continue dynamic balance.        Problem List Patient Active Problem List   Diagnosis Date Noted  . Urgency of urination 02/15/2012  . Orthostatic hypotension 02/15/2012  . Dizziness and giddiness 02/15/2012  . Abnormality of gait 02/15/2012  . Paralysis agitans 02/15/2012                            PWR - 11/12/13  1300    PWR! exercises Moves in sitting   PWR! Step Through Forward/Back in parallel bars forward, back and sideways step weight shifts   PWR! Up x 20   PWR! Rock x20   Comments cues for intensity                        Narda Bonds 11/12/2013, 1:44 PM

## 2013-11-14 DIAGNOSIS — I10 Essential (primary) hypertension: Secondary | ICD-10-CM | POA: Diagnosis not present

## 2013-11-14 DIAGNOSIS — Z79899 Other long term (current) drug therapy: Secondary | ICD-10-CM | POA: Diagnosis not present

## 2013-11-14 DIAGNOSIS — K5900 Constipation, unspecified: Secondary | ICD-10-CM | POA: Diagnosis not present

## 2013-11-14 DIAGNOSIS — R609 Edema, unspecified: Secondary | ICD-10-CM | POA: Diagnosis not present

## 2013-11-17 ENCOUNTER — Telehealth: Payer: Self-pay | Admitting: Neurology

## 2013-11-17 ENCOUNTER — Ambulatory Visit: Payer: Medicare Other | Admitting: Physical Therapy

## 2013-11-17 NOTE — Telephone Encounter (Signed)
Patient's daughter Lucita Ferrara requesting a call back to discuss patient's symptoms that she is concerned about, please return call and advise.

## 2013-11-17 NOTE — Telephone Encounter (Signed)
I called the daughter. The patient was seen by Dr. Felipa Eth, apparently a blood test indicated that he had blood, not clear what the blood test was, possibly BNP. The patient is on Lasix. He is back on Amitiza.  The patient will be getting aid and assistance to his long-term health care insurance.

## 2013-11-19 ENCOUNTER — Encounter: Payer: Self-pay | Admitting: Physical Therapy

## 2013-11-19 ENCOUNTER — Ambulatory Visit: Payer: Medicare Other | Admitting: Physical Therapy

## 2013-11-19 DIAGNOSIS — R269 Unspecified abnormalities of gait and mobility: Secondary | ICD-10-CM | POA: Diagnosis not present

## 2013-11-19 NOTE — Patient Instructions (Signed)
Single Step: Forward / Backward   Lifting foot off floor, take one step slowly forward with right leg. Return to starting position. Take one step backward and return. Repeat 10 times per session. Do 2 sessions per day. Repeat with other leg.  Hold railing in hall for support    Functional Quadriceps: Sit to Stand   Sit on edge of chair, feet flat on floor. Stand upright, extending knees fully. Repeat 10 times per set. Do 2 sessions per day.  Remember to get your nose over your toes.  http://orth.exer.us/734   Copyright  VHI. All rights reserved.   SINGLE LIMB STANCE   Stance: single leg on floor. Raise leg. Hold 10 seconds. Repeat with other leg. 3 reps per set, 2 sets per day.  Hold railing in hallway for support.  Copyright  VHI. All rights reserved.

## 2013-11-19 NOTE — Therapy (Signed)
Physical Therapy Treatment  Patient Details  Name: DAMON HARGROVE MRN: [id number] Date of Birth: 1929/05/03  Encounter Date: 11/19/2013      PT End of Session - 11/19/13 1309    Visit Number 8   Number of Visits 17   Date for PT Re-Evaluation 12/07/13   PT Start Time 1104   PT Stop Time 0300   PT Time Calculation (min) 45 min   Activity Tolerance Patient tolerated treatment well;Other (comment)  Needs seated rest breaks.      Past Medical History  Diagnosis Date  . Parkinson disease   . Osteoporosis   . Dizziness   . Depression   . Hypertension   . Gait disturbance   . Benign enlargement of prostate   . Orthostatic hypotension   . Fracture, rib   . Memory loss     Past Surgical History  Procedure Laterality Date  . Appendectomy    . Tonsillectomy    . Hernia repair    . Cataract extraction Left     There were no vitals taken for this visit.  Visit Diagnosis:  Abnormality of gait      Subjective Assessment - 11/19/13 1110    Symptoms No falls.  Denies pain.  Has been working with trainer.   Currently in Pain? No/denies          [facility] Adult PT Treatment/Exercise - 11/19/13 1141    Knee/Hip Exercises: Aerobic   Stationary Bike All 4 extremities x 8 minutes at 2.0 level   Balance Exercises   March on Foam/Wedge 10 reps   March on Foam/Wedge Limitations needs bil UE support   SLS Eyes open;Solid surface;Hand held assist (HHA) 1;3 reps;10 secs   Tandem Stance Eyes open;Hand held assist (HHA) 2;2 reps;10 secs   Balance Beam in parallel bars with bil UE assist            Plan - 11/19/13 1311    Clinical Impression Statement Pt working out with his trainer but not doing HEP previously provided   Pt will benefit from skilled therapeutic intervention in order to improve on the following deficits Difficulty walking;Decreased endurance;Decreased strength   Rehab Potential Good   PT Treatment/Interventions Therapeutic activities;Therapeutic  exercise;Balance training;Gait training;Neuromuscular re-education;Functional mobility training   PT Next Visit Plan Provide HEP written in Pt instructions today (did not issue handout secondary to time constraints).  Continue balance activities.   Consulted and Agree with Plan of Care Patient     Problem List Patient Active Problem List   Diagnosis Date Noted  . Urgency of urination 02/15/2012  . Orthostatic hypotension 02/15/2012  . Dizziness and giddiness 02/15/2012  . Abnormality of gait 02/15/2012  . Paralysis agitans 02/15/2012        [facility]) - 11/19/13 1144    Step and Reach Forward 10 reps;Other reps (comment)  with UE support   Step and Reach Backward 10 reps;Other reps (comment)  with UE support   Step and Reach Sideways 10 reps;Other reps (comment)  with UE support       Narda Bonds 11/19/2013, 1:17 PM

## 2013-11-20 NOTE — Telephone Encounter (Signed)
Form,[employer] received from Nurse Butch Penny and Dr Jannifer Franklin completed,faxed 11-20-13.

## 2013-11-24 ENCOUNTER — Encounter: Payer: Self-pay | Admitting: Physical Therapy

## 2013-11-24 ENCOUNTER — Ambulatory Visit: Payer: Medicare Other | Admitting: Physical Therapy

## 2013-11-24 DIAGNOSIS — R269 Unspecified abnormalities of gait and mobility: Secondary | ICD-10-CM | POA: Diagnosis not present

## 2013-11-24 NOTE — Therapy (Signed)
Physical Therapy Treatment  Patient Details  Name: Brian Aguirre MRN: [id number] Date of Birth: 06/01/1929  Encounter Date: 11/24/2013      PT End of Session - 11/24/13 1113    Visit Number 9  G9   Number of Visits 17   Date for PT Re-Evaluation 12/07/13   PT Start Time 8315   PT Stop Time 1103   PT Time Calculation (min) 40 min   Activity Tolerance Patient tolerated treatment well;Other (comment)  Needs seated rest breaks; needs cues for increased intensity of movement      Past Medical History  Diagnosis Date  . Parkinson disease   . Osteoporosis   . Dizziness   . Depression   . Hypertension   . Gait disturbance   . Benign enlargement of prostate   . Orthostatic hypotension   . Fracture, rib   . Memory loss     Past Surgical History  Procedure Laterality Date  . Appendectomy    . Tonsillectomy    . Hernia repair    . Cataract extraction Left     There were no vitals taken for this visit.  Visit Diagnosis:  Abnormality of gait      Subjective Assessment - 11/24/13 1026    Symptoms No falls over the weekend.  No pain; feels better today than yesterday-felt so wiped out yesterday.   Currently in Pain? No/denies            [facility] Adult PT Treatment/Exercise - 11/24/13 1040    Transfers   Transfers Sit to Stand;Stand to Sit   Sit to Stand 5: Supervision;With upper extremity assist   Sit to Stand Details (indicate cue type and reason) Cues for scooting, forward lean, momentum, hand placement  2 sets x 5 reps 22 in, 10 reps from chair   Stand to Sit 5: Supervision;With upper extremity assist   Ambulation/Gait   Ambulation/Gait Yes   Ambulation/Gait Assistance 5: Supervision   Ambulation/Gait Assistance Details Pt requires cues for large ampitude step length and improved foot clearance   Ambulation Distance (Feet) 60 Feet  x 2; 100 ft, then 315 ft (in 3 minute walk)   Assistive device Rolling walker   Gait Pattern Decreased step length -  left;Decreased stance time - left;Decreased dorsiflexion - left;Decreased dorsiflexion - right   Knee/Hip Exercises: Aerobic   Stationary Bike All 4 extremities x 8 minutes at 2.0 level  cues for RPM >30 for increased intensity   Knee/Hip Exercises: Standing   Other Standing Knee Exercises forward/backward stepping at counter  10 reps each, with cues for foot clearance   Balance Exercises: Standing   SLS Eyes open;Solid surface;3 reps;10 secs  at counter          PT Education - 11/24/13 1112    Education provided Yes   Education Details Provided written instructions for HEP/performed HEP instructed on 11/19/13; discussed importance of performing HEP consistency   Person(s) Educated Caregiver(s)   Methods Explanation;Demonstration;Handout   Comprehension Verbalized understanding;Verbal cues required;Need further instruction              Plan - 11/24/13 1114    Clinical Impression Statement Pt is able to increase step length with gait and increase intensity of activity on Sci [employer] with frequent verbal cues; however, he goes back to smaller movement pattern without continued cueing.   Pt will benefit from skilled therapeutic intervention in order to improve on the following deficits Difficulty walking;Decreased endurance;Decreased strength;Decreased balance;Abnormal gait  Rehab Potential Good   PT Treatment/Interventions Therapeutic activities;Therapeutic exercise;Balance training;Gait training;Neuromuscular re-education;Functional mobility training   PT Next Visit Plan G-code next visit; review HEP handout given 11/24/13 visit; continue gait and balance training   Consulted and Agree with Plan of Care Patient        Problem List Patient Active Problem List   Diagnosis Date Noted  . Urgency of urination 02/15/2012  . Orthostatic hypotension 02/15/2012  . Dizziness and giddiness 02/15/2012  . Abnormality of gait 02/15/2012  . Paralysis agitans 02/15/2012                                               Stacy Deshler W. 11/24/2013, 11:23 AM

## 2013-11-26 ENCOUNTER — Encounter: Payer: Self-pay | Admitting: Physical Therapy

## 2013-11-26 ENCOUNTER — Ambulatory Visit: Payer: Medicare Other | Admitting: Physical Therapy

## 2013-11-26 ENCOUNTER — Encounter: Payer: Self-pay | Admitting: Neurology

## 2013-11-26 DIAGNOSIS — R269 Unspecified abnormalities of gait and mobility: Secondary | ICD-10-CM | POA: Diagnosis not present

## 2013-11-26 NOTE — Therapy (Signed)
Physical Therapy Treatment  Patient Details  Name: Brian Aguirre MRN: [id number] Date of Birth: August 31, 1929  Encounter Date: 11/26/2013      PT End of Session - 11/26/13 1302    Visit Number 10  G10   Number of Visits 17   Date for PT Re-Evaluation 12/07/13   PT Start Time 1107   PT Stop Time 1146   PT Time Calculation (min) 39 min   Activity Tolerance Patient tolerated treatment well;Other (comment)  Needs seated rest breaks, cues for intensity of movement      Past Medical History  Diagnosis Date  . Parkinson disease   . Osteoporosis   . Dizziness   . Depression   . Hypertension   . Gait disturbance   . Benign enlargement of prostate   . Orthostatic hypotension   . Fracture, rib   . Memory loss     Past Surgical History  Procedure Laterality Date  . Appendectomy    . Tonsillectomy    . Hernia repair    . Cataract extraction Left     There were no vitals taken for this visit.  Visit Diagnosis:  Abnormality of gait      Subjective Assessment - 11/26/13 1110    Symptoms No falls, feels tired today.   Currently in Pain? No/denies          [facility] PT Assessment - 11/26/13 1120    Standardized Balance Assessment   Standardized Balance Assessment Timed Up and Go Test   Timed Up and Go Test   TUG Normal TUG   Normal TUG (seconds) 37.17          [facility] Adult PT Treatment/Exercise - 11/26/13 1120    Transfers   Transfers Sit to Stand   Sit to Stand 5: Supervision   Sit to Stand Details (indicate cue type and reason) Cues for momentum, forward lean, scooting  2 sets x 5 reps 22 inches; 10 reps from 18 inches   Stand to Sit 5: Supervision   Ambulation/Gait   Ambulation/Gait Yes   Ambulation/Gait Assistance 5: Supervision   Ambulation/Gait Assistance Details Pt requires cues for large amplitude step length, posture  gait velocity 12.87 sec in 10 meter : 2.55 ft/sec     Ambulation Distance (Feet) 60 Feet  80 ft, then 120 ft.; 40 ft x 2   Assistive  device Rolling walker   Gait Pattern Decreased step length - left;Decreased stance time - left;Decreased dorsiflexion - left;Decreased dorsiflexion - right   High Level Balance   High Level Balance Activities --  alternating step taps to 6 and 12 inch step, 10 reps    High Level Balance Comments UE support, cues for deliberate foot lifting   Knee/Hip Exercises: Standing   Other Standing Knee Exercises forward/backward stepping at counter  15 reps each   Other Standing Knee Exercises ant/post stagger stance weighshifting; lateral weightshifting  15 reps each                Plan - 11/26/13 1303    Clinical Impression Statement Pt needs frequent cueing for larger amplitude movement patterns during activities.  He does report being more aware of taking increased step length with RW in hallways at [facility].   Pt will benefit from skilled therapeutic intervention in order to improve on the following deficits Difficulty walking;Decreased endurance;Decreased strength;Decreased balance;Abnormal gait   Rehab Potential Good   PT Treatment/Interventions Therapeutic activities;Therapeutic exercise;Balance training;Gait training;Neuromuscular re-education;Functional mobility training   PT  Next Visit Plan Check goals next week; determine if pt needs to renew for additional therapy beyond next week   Consulted and Agree with Plan of Care Patient          G-Codes - 12/10/2013 1305    Functional Assessment Tool Used TUG (37.17 sec); gait velocity 2.55 ft/sec   Functional Limitation Mobility: Walking and moving around   Mobility: Walking and Moving Around Current Status [phone]) At least 40 percent but less than 60 percent impaired, limited or restricted   Mobility: Walking and Moving Around Goal Status [phone]) At least 40 percent but less than 60 percent impaired, limited or restricted      Problem List Patient Active Problem List   Diagnosis Date Noted  . Urgency of urination 02/15/2012   . Orthostatic hypotension 02/15/2012  . Dizziness and giddiness 02/15/2012  . Abnormality of gait 02/15/2012  . Paralysis agitans 02/15/2012                                              Loris Seelye W. 12/10/2013, 1:08 PM

## 2013-12-01 ENCOUNTER — Encounter: Payer: Self-pay | Admitting: Physical Therapy

## 2013-12-01 ENCOUNTER — Ambulatory Visit: Payer: Medicare Other | Admitting: Physical Therapy

## 2013-12-01 DIAGNOSIS — R269 Unspecified abnormalities of gait and mobility: Secondary | ICD-10-CM

## 2013-12-01 NOTE — Therapy (Signed)
Physical Therapy Treatment  Patient Details  Name: Brian Aguirre MRN: [id number] Date of Birth: [birth year]  Encounter Date: 12/01/2013      PT End of Session - 12/01/13 1637    Visit Number 11  G1   Number of Visits 17   Date for PT Re-Evaluation 12/07/13   PT Start Time 1540   PT Stop Time 1619   PT Time Calculation (min) 39 min   Activity Tolerance Patient tolerated treatment well  Requires seating rest breaks with standing activities      Past Medical History  Diagnosis Date  . Parkinson disease   . Osteoporosis   . Dizziness   . Depression   . Hypertension   . Gait disturbance   . Benign enlargement of prostate   . Orthostatic hypotension   . Fracture, rib   . Memory loss     Past Surgical History  Procedure Laterality Date  . Appendectomy    . Tonsillectomy    . Hernia repair    . Cataract extraction Left     There were no vitals taken for this visit.  Visit Diagnosis:  Abnormality of gait      Subjective Assessment - 12/01/13 1539    Symptoms No new changes today   Currently in Pain? No/denies            [facility] Adult PT Treatment/Exercise - 12/01/13 1541    Transfers   Transfers Sit to Stand;Stand to Sit   Sit to Stand 5: Supervision;With upper extremity assist  from chair, 5 times during session   Stand to Sit 5: Supervision;With upper extremity assist   Ambulation/Gait   Ambulation/Gait --   Ambulation/Gait Assistance 5: Supervision   Ambulation/Gait Assistance Details Pt requires cues for large amplitude step length, posture   Ambulation Distance (Feet) 80 Feet  x 2 reps   Assistive device Rolling walker   Gait Pattern Decreased step length - left;Decreased stance time - left;Decreased dorsiflexion - left;Decreased dorsiflexion - right   Stairs Yes   Stairs Assistance 4: Min assist   Stairs Assistance Details (indicate cue type and reason) Varied step-to/step through pattern, 2 rails>1 rail, cues for deliberate foot placement, hand  placement  simulating stairs at daughter's home   Number of Stairs 4  8 reps   Height of Stairs 6  inch   High Level Balance   High Level Balance Activities --  forward/side step taps to 6 in step; fwd to 12 in step   High Level Balance Comments UE support, cues for deliberate foot lifting  to improve single limb stance, deliberate foot placement   Knee/Hip Exercises: Standing   Forward Step Up Right;Left;15 reps;Step Height: 6"  bilateral UE support   Forward Step Up Limitations cues for posture, full knee extension, for improved single limb stance  Added to HEP          PT Education - 12/01/13 1636    Education provided Yes   Education Details HEP for forward step ups, for pt to ask trainer to assist him; stair negotiation for upcoming trip to daughter's home   Person(s) Educated Patient;Spouse   Methods Demonstration;Handout   Comprehension Verbalized understanding;Returned demonstration              Plan - 12/01/13 1638    Clinical Impression Statement Pt demonstrates understanding of varied practice techniques with stair negotiation in preparation for daughter's home.  He does report increased fearfulness with stair negotiation.   Pt will benefit  from skilled therapeutic intervention in order to improve on the following deficits Difficulty walking;Decreased endurance;Decreased strength;Decreased balance;Abnormal gait   Rehab Potential Good   PT Treatment/Interventions Therapeutic activities;Therapeutic exercise;Balance training;Gait training;Neuromuscular re-education;Functional mobility training   PT Next Visit Plan Check goals next visit and plan for renew   Consulted and Agree with Plan of Care Patient;Family member/caregiver        Problem List Patient Active Problem List   Diagnosis Date Noted  . Urgency of urination 02/15/2012  . Orthostatic hypotension 02/15/2012  . Dizziness and giddiness 02/15/2012  . Abnormality of gait 02/15/2012  . Paralysis  agitans 02/15/2012                                              Livian Vanderbeck W. 12/01/2013, 4:43 PM   Mady Haagensen, PT 12/01/2013 4:44 PM Phone: [phone] Fax: [phone]

## 2013-12-01 NOTE — Patient Instructions (Signed)
Proprioception, Quad Strength, Timing, Coordination: Forward Step-Up   Move onto step with one foot, then the other. Make sure to fully place your foot on the step.  Make sure to straighten your leg fully when you are on the step.  Step back off the same way. Use __6_ inch step (bottom step of the stairs, holding onto rail). Repeat __10__ times, 2 sets. Do __1__ session per day. http://cc.exer.us/4   Copyright  VHI. All rights reserved.

## 2013-12-02 ENCOUNTER — Encounter: Payer: Self-pay | Admitting: Neurology

## 2013-12-03 ENCOUNTER — Encounter: Payer: Self-pay | Admitting: Physical Therapy

## 2013-12-03 ENCOUNTER — Ambulatory Visit: Payer: Medicare Other | Admitting: Physical Therapy

## 2013-12-03 VITALS — BP 160/64 | HR 64

## 2013-12-03 DIAGNOSIS — R269 Unspecified abnormalities of gait and mobility: Secondary | ICD-10-CM | POA: Diagnosis not present

## 2013-12-03 NOTE — Therapy (Signed)
Physical Therapy Treatment  Patient Details  Name: Brian Aguirre MRN: [id number] Date of Birth: [birth year]  Encounter Date: 12/03/2013      PT End of Session - 12/03/13 1035    Visit Number 12  G2   Number of Visits 17   Date for PT Re-Evaluation 12/07/13   PT Start Time 0937   PT Stop Time 2122  Pt not seen for full session, as he needed to use restroom during PT session    PT Time Calculation (min) 41 min   Equipment Utilized During Treatment Gait belt   Activity Tolerance Other (comment)  Pt has c/o dizziness today; requires seated rest breaks with activity      Past Medical History  Diagnosis Date  . Parkinson disease   . Osteoporosis   . Dizziness   . Depression   . Hypertension   . Gait disturbance   . Benign enlargement of prostate   . Orthostatic hypotension   . Fracture, rib   . Memory loss     Past Surgical History  Procedure Laterality Date  . Appendectomy    . Tonsillectomy    . Hernia repair    . Cataract extraction Left     BP 160/64 mmHg  Pulse 64  Visit Diagnosis:  Abnormality of gait      Subjective Assessment - 12/03/13 0940    Symptoms Pt reports this is one of those days where he just feels more dizzy.  Pt reports he has eaten breakfast, taken meds as usual.  Has not yet tried performing step ups with trainer.   Currently in Pain? No/denies            [facility] Adult PT Treatment/Exercise - 12/03/13 0945    Transfers   Transfers Sit to Stand;Stand to Sit   Sit to Stand 5: Supervision  from chair; 8 times during session   Sit to Stand Details (indicate cue type and reason) Cues for momentum and forward lean   Ambulation/Gait   Ambulation/Gait Yes   Ambulation/Gait Assistance 5: Supervision   Ambulation/Gait Assistance Details cues for large amplitude step length   Ambulation Distance (Feet) 80 Feet  x 4 reps   Assistive device Rolling walker   Gait Pattern Decreased step length - left;Decreased stance time - left;Decreased  dorsiflexion - left;Decreased dorsiflexion - right   Gait velocity 2.11 ft/sec   Stairs Yes   Stairs Assistance 4: Min guard   Stairs Assistance Details (indicate cue type and reason) bilateral rails step through pattern, then one rail with step to pattern   simulating stairs at daughter's home   Number of Stairs 4  3 reps   Height of Stairs 6  inch   Timed Up and Go Test   TUG Normal TUG   Normal TUG (seconds) 34.37   High Level Balance   High Level Balance Activities --  fwd/side step to 6 inch step; fwd step to 12" step   High Level Balance Comments UE support, cues for deliberate foot lifting   Knee/Hip Exercises: Standing   Forward Step Up Right;Left;15 reps;Step Height: 6"   Forward Step Up Limitations cues for posture, full knee extension, for improved single limb stance          PT Education - 12/03/13 1008    Education provided Yes   Education Details Reviewed stair negotiation for upcoming trip to daughter's home.   Person(s) Educated Patient;Caregiver(s)   Methods Demonstration;Explanation   Comprehension Verbalized understanding;Returned demonstration  PT Long Term Goals - 12/03/13 1010    PT LONG TERM GOAL #4   Title Pt will be able to improve TUG score to less than or equal to 35 seconds for decreased fall risk.   Status Achieved  TUG 34.37 seconds with RW   PT LONG TERM GOAL #5   Title Pt will be to improve gait velocity to a tleast 2.62 ft/sec for improved efficiency and safety with gait.   Status Not Met  gait velocity 2.11 ft/sec   PT LONG TERM GOAL #8   Status Not Met          Plan - 12/03/13 1040    Clinical Impression Statement Pt has met LTG #4.  LTG# 5 and 8 not met.  Pt is improving with stair negotiation.  Remaining goals to be assessed next session.  Pt would continue to benefit from further skilled PT to further address balance, strength, and gait.   Pt will benefit from skilled therapeutic intervention in order to improve  on the following deficits Difficulty walking;Decreased endurance;Decreased strength;Decreased balance;Abnormal gait   Rehab Potential Good   PT Frequency 2x / week   PT Treatment/Interventions Therapeutic activities;Therapeutic exercise;Balance training;Gait training;Neuromuscular re-education;Functional mobility training   PT Next Visit Plan Check remaining goals next session; complete recert   Consulted and Agree with Plan of Care Patient;Family member/caregiver        Problem List Patient Active Problem List   Diagnosis Date Noted  . Urgency of urination 02/15/2012  . Orthostatic hypotension 02/15/2012  . Dizziness and giddiness 02/15/2012  . Abnormality of gait 02/15/2012  . Paralysis agitans 02/15/2012                                              Krupa Stege W. 12/03/2013, 10:43 AM     Mady Haagensen, PT 12/03/2013 10:44 AM Phone: [phone] Fax: [phone]

## 2013-12-08 ENCOUNTER — Encounter: Payer: Self-pay | Admitting: Physical Therapy

## 2013-12-08 ENCOUNTER — Ambulatory Visit: Payer: Medicare Other | Admitting: Physical Therapy

## 2013-12-08 DIAGNOSIS — R269 Unspecified abnormalities of gait and mobility: Secondary | ICD-10-CM

## 2013-12-08 NOTE — Patient Instructions (Signed)

## 2013-12-08 NOTE — Therapy (Signed)
Physical Therapy Treatment  Patient Details  Name: Brian Aguirre MRN: [id number] Date of Birth: [birth year]  Encounter Date: 12/08/2013      PT End of Session - 12/08/13 1210    Visit Number 13   Number of Visits 20  per recert today-added 8 visitsto POC   Date for PT Re-Evaluation 01/06/14   PT Start Time 1020   PT Stop Time 1100   PT Time Calculation (min) 40 min   Activity Tolerance Patient tolerated treatment well      Past Medical History  Diagnosis Date  . Parkinson disease   . Osteoporosis   . Dizziness   . Depression   . Hypertension   . Gait disturbance   . Benign enlargement of prostate   . Orthostatic hypotension   . Fracture, rib   . Memory loss     Past Surgical History  Procedure Laterality Date  . Appendectomy    . Tonsillectomy    . Hernia repair    . Cataract extraction Left     There were no vitals taken for this visit.  Visit Diagnosis:  Abnormality of gait - Plan: PT plan of care cert/re-cert      Subjective Assessment - 12/08/13 1021    Symptoms Pt reports having a good Thanksgiving.  He went to daughter's home and he didn't have any difficulty negotiating her steps, other than the double vision he has when looking down at steps.   Currently in Pain? No/denies            [facility] Adult PT Treatment/Exercise - 12/08/13 1025    Transfers   Transfers Sit to Stand;Stand to Sit   Sit to Stand 6: Modified independent (Device/Increase time)   Sit to Stand Details (indicate cue type and reason) No UE support from 22 inch surface x 10 reps, from 18 inch surface 8 of 10 reps with no UE support   Stand to Sit 6: Modified independent (Device/Increase time)   Berg Balance Test   Sit to Stand Able to stand without using hands and stabilize independently   Standing Unsupported Able to stand safely 2 minutes   Sitting with Back Unsupported but Feet Supported on Floor or Stool Able to sit safely and securely 2 minutes   Stand to Sit Sits safely with  minimal use of hands   Transfers Able to transfer safely, definite need of hands   Standing Unsupported with Eyes Closed Able to stand 10 seconds safely   Standing Ubsupported with Feet Together Needs help to attain position but able to stand for 30 seconds with feet together   From Standing, Reach Forward with Outstretched Arm Can reach forward >12 cm safely (5")   From Standing Position, Pick up Object from Floor Able to pick up shoe safely and easily   From Standing Position, Turn to Look Behind Over each Shoulder Looks behind one side only/other side shows less weight shift   Turn 360 Degrees Able to turn 360 degrees safely but slowly  19.78 sec   Standing Unsupported, Alternately Place Feet on Step/Stool Needs assistance to keep from falling or unable to try   Standing Unsupported, One Foot in [employer] balance while stepping or standing  2 attempts with posterior LOB   Standing on One Leg Unable to try or needs assist to prevent fall   Total Score 36   High Level Balance   High Level Balance Comments in parallell bars, heel/toe raises x 10 , hip strategy  work, for improved anterior/posterior weightshifting; posterior step and weighshift x 10 bilaterally  Pt has difficulty with hip strategy in post. direction          PT Education - 12/08/13 1209    Education provided Yes   Education Details Fall prevention; began discussion of community fitness   Person(s) Educated Patient   Methods Explanation   Comprehension Verbalized understanding            PT Long Term Goals - 12/08/13 1022    PT LONG TERM GOAL #1   Title Pt will perform progressive HEP with supervision to address balance, strength, gait.   Time 4   Period Weeks   Status New   PT LONG TERM GOAL #2   Title Pt will improve Berg Balance score to at least 41/56 for decreased fall risk   Time 4   Period Weeks   Status New   PT LONG TERM GOAL #3   Title Pt will improve Timed Up and Go score to less than or equal  to 20 seconds for decreased fall risk.   Time 4   Period Weeks   Status New  Berg Balance score 36/56   PT LONG TERM GOAL #4   Title verbalize plans for continued community fitness upon D/C from PT   Time 4   Period Weeks   Status New   PT LONG TERM GOAL #6   Status --   PT LONG TERM GOAL #7   Title --   Status --          Plan - 12/08/13 1214    Clinical Impression Statement Pt has met LTG #1,2,3,4.  LTG #5, 7, 8 not met.  LTG # 6 not yet met-to be ongoing.  Please see updated long term goals #1-4 for modifications, with plans to continue to address balance, strength and gait for overall fall prevention and improved functional mboility.   Pt will benefit from skilled therapeutic intervention in order to improve on the following deficits Difficulty walking;Decreased endurance;Decreased strength;Decreased balance;Abnormal gait   Rehab Potential Good   PT Frequency 2x / week   PT Duration 4 weeks  updated per recert today   PT Treatment/Interventions Therapeutic activities;Therapeutic exercise;Balance training;Gait training;Neuromuscular re-education;Functional mobility training   PT Next Visit Plan continue to work on balance strategies-ankle, hip, step strategies for balance recovery, as well as single limb stance, tandem stance, dynamic gait activities   Consulted and Agree with Plan of Care Patient        Problem List Patient Active Problem List   Diagnosis Date Noted  . Urgency of urination 02/15/2012  . Orthostatic hypotension 02/15/2012  . Dizziness and giddiness 02/15/2012  . Abnormality of gait 02/15/2012  . Paralysis agitans 02/15/2012                                              Brian Livsey W. 12/08/2013, 12:26 PM     Brian Aguirre, PT 12/08/2013 12:27 PM Phone: [phone] Fax: [phone]

## 2013-12-10 ENCOUNTER — Ambulatory Visit: Payer: Medicare Other | Attending: Neurology | Admitting: Physical Therapy

## 2013-12-10 ENCOUNTER — Encounter: Payer: Self-pay | Admitting: Physical Therapy

## 2013-12-10 DIAGNOSIS — R269 Unspecified abnormalities of gait and mobility: Secondary | ICD-10-CM | POA: Insufficient documentation

## 2013-12-10 DIAGNOSIS — Z9181 History of falling: Secondary | ICD-10-CM | POA: Insufficient documentation

## 2013-12-10 DIAGNOSIS — G20 Parkinson's disease: Secondary | ICD-10-CM | POA: Diagnosis not present

## 2013-12-10 DIAGNOSIS — R42 Dizziness and giddiness: Secondary | ICD-10-CM | POA: Insufficient documentation

## 2013-12-10 NOTE — Therapy (Signed)
[facility] [address] [place], [place], [address] Phone: [phone]   Fax:  [phone]  Physical Therapy Treatment  Patient Details  Name: Brian Aguirre MRN: [id number] Date of Birth: [birth year]  Encounter Date: 12/10/2013      PT End of Session - 12/10/13 1302    Visit Number 14   Number of Visits 20   Date for PT Re-Evaluation 01/06/14   PT Start Time 1017   PT Stop Time 1048   PT Time Calculation (min) 31 min   Activity Tolerance Patient tolerated treatment well      Past Medical History  Diagnosis Date  . Parkinson disease   . Osteoporosis   . Dizziness   . Depression   . Hypertension   . Gait disturbance   . Benign enlargement of prostate   . Orthostatic hypotension   . Fracture, rib   . Memory loss     Past Surgical History  Procedure Laterality Date  . Appendectomy    . Tonsillectomy    . Hernia repair    . Cataract extraction Left     There were no vitals taken for this visit.  Visit Diagnosis:  Abnormality of gait      Subjective Assessment - 12/10/13 1022    Symptoms "I may have had a fall since I saw you last" but then when questioned pt denied fall since last visit.   Currently in Pain? No/denies          [facility] Adult PT Treatment/Exercise - 12/10/13 1024    Ambulation/Gait   Ambulation/Gait Yes   Ambulation/Gait Assistance 5: Supervision   Ambulation/Gait Assistance Details verbal cues for heel strike and foot clearance   Ambulation Distance (Feet) 100 Feet  twice   Assistive device Rolling walker   Gait Pattern Decreased step length - left;Decreased stance time - left;Decreased dorsiflexion - left;Decreased dorsiflexion - right   Knee/Hip Exercises: Aerobic   Stationary Bike All 4 extremities x 5 minutes at level 1.0   Knee/Hip Exercises: Standing   Forward Step Up Right;Left;15 reps;Step Height: 6"   Other Standing Knee Exercises forward stepping in parallel bars with bil UE,  then one UE           Plan - 12/10/13 1302    Clinical Impression Statement Pt needed to use bathroom during session so unable to complete a full session.  CNA present for todays session.  Continue PT per POC   Pt will benefit from skilled therapeutic intervention in order to improve on the following deficits Difficulty walking;Decreased endurance;Decreased strength;Decreased balance;Abnormal gait   Rehab Potential Good   PT Frequency 2x / week   PT Duration 4 weeks   PT Treatment/Interventions Therapeutic activities;Therapeutic exercise;Balance training;Gait training;Neuromuscular re-education;Functional mobility training   PT Next Visit Plan Gait with intensity and heel strike.  Balance strategies, SLS   Consulted and Agree with Plan of Care Patient            Balance Exercises - 12/10/13 1053    Balance Exercises: Standing   Other Standing Exercises Taps to 4" step with bil UE, one UE then no UE support   Overall Comments Other (comment)  difficulty seeing step secondary to double vision       Problem List Patient Active Problem List   Diagnosis Date Noted  . Urgency of urination 02/15/2012  . Orthostatic hypotension 02/15/2012  . Dizziness and giddiness 02/15/2012  . Abnormality of gait 02/15/2012  . Paralysis agitans 02/15/2012  Narda Bonds, [place] 12/10/2013 1:06 PM Phone: [phone] Fax: [phone]

## 2013-12-12 ENCOUNTER — Emergency Department (HOSPITAL_COMMUNITY): Payer: Medicare Other

## 2013-12-12 ENCOUNTER — Encounter (HOSPITAL_COMMUNITY): Payer: Self-pay | Admitting: Emergency Medicine

## 2013-12-12 ENCOUNTER — Emergency Department (HOSPITAL_COMMUNITY)
Admission: EM | Admit: 2013-12-12 | Discharge: 2013-12-12 | Disposition: A | Discharge status: Home / Self Care | Payer: Medicare Other | Attending: Emergency Medicine | Admitting: Emergency Medicine

## 2013-12-12 DIAGNOSIS — F329 Major depressive disorder, single episode, unspecified: Secondary | ICD-10-CM | POA: Insufficient documentation

## 2013-12-12 DIAGNOSIS — I10 Essential (primary) hypertension: Secondary | ICD-10-CM | POA: Insufficient documentation

## 2013-12-12 DIAGNOSIS — G20 Parkinson's disease: Secondary | ICD-10-CM | POA: Diagnosis not present

## 2013-12-12 DIAGNOSIS — Y9289 Other specified places as the place of occurrence of the external cause: Secondary | ICD-10-CM | POA: Diagnosis not present

## 2013-12-12 DIAGNOSIS — Z8781 Personal history of (healed) traumatic fracture: Secondary | ICD-10-CM | POA: Diagnosis not present

## 2013-12-12 DIAGNOSIS — Z79899 Other long term (current) drug therapy: Secondary | ICD-10-CM | POA: Diagnosis not present

## 2013-12-12 DIAGNOSIS — W19XXXA Unspecified fall, initial encounter: Secondary | ICD-10-CM

## 2013-12-12 DIAGNOSIS — M810 Age-related osteoporosis without current pathological fracture: Secondary | ICD-10-CM | POA: Diagnosis not present

## 2013-12-12 DIAGNOSIS — Z9181 History of falling: Secondary | ICD-10-CM | POA: Diagnosis not present

## 2013-12-12 DIAGNOSIS — M25559 Pain in unspecified hip: Secondary | ICD-10-CM | POA: Diagnosis not present

## 2013-12-12 DIAGNOSIS — Y998 Other external cause status: Secondary | ICD-10-CM | POA: Diagnosis not present

## 2013-12-12 DIAGNOSIS — S79922A Unspecified injury of left thigh, initial encounter: Secondary | ICD-10-CM | POA: Diagnosis not present

## 2013-12-12 DIAGNOSIS — M79652 Pain in left thigh: Secondary | ICD-10-CM | POA: Diagnosis not present

## 2013-12-12 DIAGNOSIS — S79912A Unspecified injury of left hip, initial encounter: Secondary | ICD-10-CM | POA: Diagnosis present

## 2013-12-12 DIAGNOSIS — Z87448 Personal history of other diseases of urinary system: Secondary | ICD-10-CM | POA: Insufficient documentation

## 2013-12-12 DIAGNOSIS — M25552 Pain in left hip: Secondary | ICD-10-CM | POA: Diagnosis not present

## 2013-12-12 DIAGNOSIS — W1839XA Other fall on same level, initial encounter: Secondary | ICD-10-CM | POA: Insufficient documentation

## 2013-12-12 DIAGNOSIS — Y9301 Activity, walking, marching and hiking: Secondary | ICD-10-CM | POA: Insufficient documentation

## 2013-12-12 DIAGNOSIS — S7002XA Contusion of left hip, initial encounter: Secondary | ICD-10-CM | POA: Diagnosis not present

## 2013-12-12 DIAGNOSIS — Z7982 Long term (current) use of aspirin: Secondary | ICD-10-CM | POA: Diagnosis not present

## 2013-12-12 DIAGNOSIS — R259 Unspecified abnormal involuntary movements: Secondary | ICD-10-CM | POA: Diagnosis not present

## 2013-12-12 MED ORDER — ACETAMINOPHEN 325 MG PO TABS
650.0000 mg | ORAL_TABLET | Freq: Once | ORAL | Status: AC
  Administered 2013-12-12: 650 mg via ORAL

## 2013-12-12 NOTE — Progress Notes (Addendum)
Pt from [facility], however facility is concerned about pt ability to care for himself. CSW received call from facility that states that patient can come to skilled nursing and bed available. CSW to discuss with family and complete fl2.   Noreene Larsson [phone]  ED CSW 12/12/2013 12:28 PM   CSW discussed with pt and pt wife. Pt agreeable to snf placement at [facility] completed, fl2 and clinicals faxed to [phone] as requsted to [facility].   Noreene Larsson [phone]  ED CSW 12/12/2013 1:21 PM

## 2013-12-12 NOTE — Discharge Instructions (Signed)
You were evaluated in the emergency department today for a fall. You had an x-ray and CT of the hip. There is no evidence of broken bones on these films. Please follow-up with your family doctor for further evaluation. You can take Tylenol, available over-the-counter as needed for pain.   Contusion A contusion is a deep bruise. Contusions are the result of an injury that caused bleeding under the skin. The contusion may turn blue, purple, or yellow. Minor injuries will give you a painless contusion, but more severe contusions may stay painful and swollen for a few weeks.  CAUSES  A contusion is usually caused by a blow, trauma, or direct force to an area of the body. SYMPTOMS   Swelling and redness of the injured area.  Bruising of the injured area.  Tenderness and soreness of the injured area.  Pain. DIAGNOSIS  The diagnosis can be made by taking a history and physical exam. An X-ray, CT scan, or MRI may be needed to determine if there were any associated injuries, such as fractures. TREATMENT  Specific treatment will depend on what area of the body was injured. In general, the best treatment for a contusion is resting, icing, elevating, and applying cold compresses to the injured area. Over-the-counter medicines may also be recommended for pain control. Ask your caregiver what the best treatment is for your contusion. HOME CARE INSTRUCTIONS   Put ice on the injured area.  Put ice in a plastic bag.  Place a towel between your skin and the bag.  Leave the ice on for 15-20 minutes, 3-4 times a day, or as directed by your health care provider.  Only take over-the-counter or prescription medicines for pain, discomfort, or fever as directed by your caregiver. Your caregiver may recommend avoiding anti-inflammatory medicines (aspirin, ibuprofen, and naproxen) for 48 hours because these medicines may increase bruising.  Rest the injured area.  If possible, elevate the injured area to  reduce swelling. SEEK IMMEDIATE MEDICAL CARE IF:   You have increased bruising or swelling.  You have pain that is getting worse.  Your swelling or pain is not relieved with medicines. MAKE SURE YOU:   Understand these instructions.  Will watch your condition.  Will get help right away if you are not doing well or get worse. Document Released: 10/05/2004 Document Revised: 12/31/2012 Document Reviewed: 10/31/2010 [facility] Patient Information 2015 [place], [place]. This information is not intended to replace advice given to you by your health care provider. Make sure you discuss any questions you have with your health care provider.

## 2013-12-12 NOTE — ED Notes (Signed)
Bed: WA17 Expected date:  Expected time:  Means of arrival:  Comments: EMS fall 

## 2013-12-12 NOTE — ED Notes (Signed)
Per EMS-states patient fell last night-woke up this am with left hip pain-increased pain with wight bearing, no pain at rest

## 2013-12-12 NOTE — Progress Notes (Signed)
Pt to be transported to [facility] skilled nursing by ptar. RN to call report to [phone] extion [phone]. Pt to be go to room N36. Pt and pt family in agreement.   Noreene Larsson [phone]  ED CSW 12/12/2013 1:22 PM

## 2013-12-12 NOTE — Progress Notes (Signed)
ED CM noted pt with fall, Reviewed som epic notes pt has recently been receiving outpatient neurosurgery therapy services.  Pt confirms he has a walker and a cane at home.  Wife at bedside confirms they have received services from [facility] CNAs at their apartment.  Pt inquired about when he would see the EDP.  CM discussed pt inquiry with ED Charge RN, Marzetta Board.

## 2013-12-12 NOTE — ED Provider Notes (Signed)
CSN: [id number]     Arrival date & time 12/12/13  1000 History   First MD Initiated Contact with Patient 12/12/13 1008     Chief Complaint  Patient presents with  . Fall     Patient is a 80 y.o. male presenting with fall. The history is provided by the patient.  Fall   Mr. Susman presents for evaluation of injuries following a fall. He says 11:30 last night he was walking when his left leg slid in front of him and he fell onto the left side. He has pain at the left hip with standing and ambulation. He has a history of frequent falls secondary to Parkinson's. He denies head injury or LOC. He denies any recent illnesses with fevers, vomiting, diarrhea, chest pain, abdominal pain. Symptoms are moderate and intermittent. He lives at home with his wife.  Past Medical History  Diagnosis Date  . Parkinson disease   . Osteoporosis   . Dizziness   . Depression   . Hypertension   . Gait disturbance   . Benign enlargement of prostate   . Orthostatic hypotension   . Fracture, rib   . Memory loss    Past Surgical History  Procedure Laterality Date  . Appendectomy    . Tonsillectomy    . Hernia repair    . Cataract extraction Left    Family History  Problem Relation Age of Onset  . Cancer Mother   . Heart failure Father    History  Substance Use Topics  . Smoking status: Never Smoker   . Smokeless tobacco: Never Used  . Alcohol Use: No    Review of Systems  All other systems reviewed and are negative.     Allergies  Myrbetriq and Stalevo  Home Medications   Prior to Admission medications   Medication Sig Start Date End Date Taking? Authorizing Provider  aspirin EC 81 MG tablet Take 81 mg by mouth at bedtime.    Historical Provider, MD  carbidopa-levodopa (SINEMET IR) 25-100 MG per tablet Take 1 tablet by mouth 3 (three) times daily.    Historical Provider, MD  carbidopa-levodopa (SINEMET IR) 25-250 MG per tablet Take 1.5 tablets by mouth 4 (four) times daily. Taken at 6  am , 9 am , 11:30 am and 2 pm    Historical Provider, MD  docusate sodium (COLACE) 100 MG capsule Take 200 mg by mouth at bedtime.     Historical Provider, MD  fish oil-omega-3 fatty acids 1000 MG capsule Take 1 g by mouth daily with breakfast.     Historical Provider, MD  furosemide (LASIX) 20 MG tablet Take 20 mg by mouth 2 (two) times daily. 10/30/13   Historical Provider, MD  lubiprostone (AMITIZA) 24 MCG capsule Take 1 capsule (24 mcg total) by mouth 2 (two) times daily with a meal. 09/18/13   Kathrynn Ducking, MD  Multiple Vitamin (MULTIVITAMIN WITH MINERALS) TABS Take 1 tablet by mouth every morning.     Historical Provider, MD  polyethylene glycol (MIRALAX / GLYCOLAX) packet Take 17 g by mouth daily as needed.    Historical Provider, MD  pramipexole (MIRAPEX) 1.5 MG tablet Take 1.5 mg by mouth 3 (three) times daily. Taken at 6 am , 2 pm and bedtime    Historical Provider, MD  Probiotic Product (ALIGN) 4 MG CAPS Take 1 capsule by mouth at bedtime.    Historical Provider, MD  selegiline (ELDEPRYL) 5 MG tablet Take 5 mg by mouth 2 (two) times  daily with a meal.    Historical Provider, MD  vitamin B-12 (CYANOCOBALAMIN) 500 MCG tablet Take 500 mcg by mouth every morning.     Historical Provider, MD  vitamin D, CHOLECALCIFEROL, 400 UNITS tablet Take 400 Units by mouth every morning.    Historical Provider, MD   BP 151/81 mmHg  Pulse 63  Temp(Src) 97.6 F (36.4 C) (Oral)  Resp 18  SpO2 98% Physical Exam  Constitutional: He is oriented to person, place, and time. He appears well-developed and well-nourished.  HENT:  Head: Normocephalic and atraumatic.  Eyes: Pupils are equal, round, and reactive to light.  Neck:  No C-spine tenderness  Cardiovascular: Normal rate and regular rhythm.   Pulmonary/Chest: Effort normal and breath sounds normal. No respiratory distress. He exhibits no tenderness.  Abdominal: Soft. There is no tenderness. There is no rebound and no guarding.  Musculoskeletal:   Tender to palpation of the left posterior lateral hip. Patient has pain on passive range of motion of the left hip. 2+ DP pulses bilaterally.  Neurological: He is alert and oriented to person, place, and time.  Slow speech. Moves all extremities symmetrically.  Skin: Skin is warm and dry.  Psychiatric: He has a normal mood and affect. His behavior is normal.  Nursing note and vitals reviewed.   ED Course  Procedures (including critical care time) Labs Review Labs Reviewed - No data to display  Imaging Review Dg Femur Left  12/12/2013   CLINICAL DATA:  Pain in the left femur. Fell getting into bed when feet slipped and struck the night stand.  EXAM: LEFT FEMUR - 2 VIEW  COMPARISON:  None.  FINDINGS: There is no evidence of fracture or other focal bone lesions. Soft tissues are unremarkable.  IMPRESSION: Negative.   Electronically Signed   By: Nelson Chimes M.D.   On: 12/12/2013 10:39   Ct Hip Left Wo Contrast  12/12/2013   CLINICAL DATA:  Left hip pain with weight-bearing today. Patient fell yesterday. Initial encounter.  EXAM: CT OF THE LEFT HIP WITHOUT CONTRAST  TECHNIQUE: Multidetector CT imaging of the left hip was performed according to the standard protocol. Multiplanar CT image reconstructions were also generated.  COMPARISON:  Radiograph same date.  FINDINGS: Imaging is limited to the left hip joint and lower left hemipelvis. The entire pelvis is not imaged.  The bones appear adequately mineralized for age. There is no evidence of acute fracture, dislocation or femoral head avascular necrosis. There are mild left hip degenerative changes.  No large hip joint effusion or periarticular fluid collection demonstrated. There is some fatty atrophy of the left gluteus minimus muscle. There is a small left inguinal hernia into which a knuckle of bowel protrudes. There is no evidence bowel incarceration or obstruction.  IMPRESSION: No evidence of acute left hip fracture or dislocation.    Electronically Signed   By: Camie Patience M.D.   On: 12/12/2013 11:44     EKG Interpretation None      MDM   Final diagnoses:  Fall  Contusion of left hip, initial encounter    Patient with history of Parkinson's disease here with evaluation of fall with left hip pain. This was a mechanical fall. There is no evidence of acute fracture on evaluation. The family has called and requested x-rays of the right hip but patient is having no pain in the right hip and is able to bear weight without pain in the right hip, do not feel imaging of the right  hip is indicated at this time. Patient has met with case management and there is a plan to get him into a higher level of care at his current facility. He is currently in independent living and we are trying to get him additional assistance because of his difficulty with ambulation due to his Parkinson's disease. Patient has no history of head injury and there is no evidence of head trauma on his evaluation.    Quintella Reichert, MD 12/12/13 1500

## 2013-12-12 NOTE — Progress Notes (Signed)
58 Cm shared with Dr Ralene Bathe Daughters concern with pelvic CT vs Left hip CT  1307 CM finished speaking with Lois ([phone]), daughter after she called nursing station while cm was speaking with ED SW.  She inquired if pt & wife had eaten Informed her they were eating at the very moment  She inquired if pt would be at [facility] at 1400 so the grand daughter a nursing student could visit after her classes Cm advised her to have grand daughter call [phone] to check prior to meeting pt at snf.  Wife wanted CM to inform Lucita Ferrara that she confirmed with CM that the pt c/o pain on left posterior hip and the xray and CT were done of those areas not the right side because there was not a complaint of right side pain

## 2013-12-12 NOTE — ED Notes (Signed)
Pt. was unable to walk on his own, but he was able to walk with two staff assistant.

## 2013-12-12 NOTE — Progress Notes (Signed)
ED CM consulted with EDP, Ralene Bathe when pt unable to walk for ED CNA.  Wife provided with a list of private duty nursing services and confirms pt is seen by a CNA from [facility] (scheduled to be seen at "eight [phone] tonight"), seen by a nurse today, has a Physiological scientist and [facility] his [facility] neurosurgery appointment on Monday 12/15/13.   ED SW contacted [facility] and pt is able to go to a lower level of care for short term rehab services CM spoke with Lucita Ferrara, daughter via telephone after she spoke with wife.  Lucita Ferrara inquired about CT checking pelvis & hip after pt nurse that came with him was concern for a fracture

## 2013-12-15 ENCOUNTER — Ambulatory Visit: Payer: Medicare Other | Admitting: Physical Therapy

## 2013-12-15 DIAGNOSIS — J309 Allergic rhinitis, unspecified: Secondary | ICD-10-CM | POA: Diagnosis not present

## 2013-12-15 DIAGNOSIS — M810 Age-related osteoporosis without current pathological fracture: Secondary | ICD-10-CM | POA: Diagnosis not present

## 2013-12-15 DIAGNOSIS — G20 Parkinson's disease: Secondary | ICD-10-CM | POA: Diagnosis not present

## 2013-12-15 DIAGNOSIS — R42 Dizziness and giddiness: Secondary | ICD-10-CM | POA: Diagnosis not present

## 2013-12-15 DIAGNOSIS — H532 Diplopia: Secondary | ICD-10-CM | POA: Diagnosis not present

## 2013-12-15 DIAGNOSIS — M6281 Muscle weakness (generalized): Secondary | ICD-10-CM | POA: Diagnosis not present

## 2013-12-16 DIAGNOSIS — G20 Parkinson's disease: Secondary | ICD-10-CM | POA: Diagnosis not present

## 2013-12-16 DIAGNOSIS — J309 Allergic rhinitis, unspecified: Secondary | ICD-10-CM | POA: Diagnosis not present

## 2013-12-16 DIAGNOSIS — M6281 Muscle weakness (generalized): Secondary | ICD-10-CM | POA: Diagnosis not present

## 2013-12-16 DIAGNOSIS — M810 Age-related osteoporosis without current pathological fracture: Secondary | ICD-10-CM | POA: Diagnosis not present

## 2013-12-16 DIAGNOSIS — H532 Diplopia: Secondary | ICD-10-CM | POA: Diagnosis not present

## 2013-12-16 DIAGNOSIS — R42 Dizziness and giddiness: Secondary | ICD-10-CM | POA: Diagnosis not present

## 2013-12-17 ENCOUNTER — Ambulatory Visit: Payer: Medicare Other | Admitting: Physical Therapy

## 2013-12-18 DIAGNOSIS — G20 Parkinson's disease: Secondary | ICD-10-CM | POA: Diagnosis not present

## 2013-12-18 DIAGNOSIS — H532 Diplopia: Secondary | ICD-10-CM | POA: Diagnosis not present

## 2013-12-18 DIAGNOSIS — M810 Age-related osteoporosis without current pathological fracture: Secondary | ICD-10-CM | POA: Diagnosis not present

## 2013-12-18 DIAGNOSIS — M6281 Muscle weakness (generalized): Secondary | ICD-10-CM | POA: Diagnosis not present

## 2013-12-18 DIAGNOSIS — R42 Dizziness and giddiness: Secondary | ICD-10-CM | POA: Diagnosis not present

## 2013-12-18 DIAGNOSIS — J309 Allergic rhinitis, unspecified: Secondary | ICD-10-CM | POA: Diagnosis not present

## 2013-12-19 ENCOUNTER — Non-Acute Institutional Stay (SKILLED_NURSING_FACILITY): Payer: Medicare Other | Admitting: Nurse Practitioner

## 2013-12-19 ENCOUNTER — Encounter: Payer: Self-pay | Admitting: Nurse Practitioner

## 2013-12-19 DIAGNOSIS — K219 Gastro-esophageal reflux disease without esophagitis: Secondary | ICD-10-CM | POA: Diagnosis not present

## 2013-12-19 DIAGNOSIS — K5900 Constipation, unspecified: Secondary | ICD-10-CM | POA: Insufficient documentation

## 2013-12-19 DIAGNOSIS — E871 Hypo-osmolality and hyponatremia: Secondary | ICD-10-CM | POA: Diagnosis not present

## 2013-12-19 DIAGNOSIS — M25552 Pain in left hip: Secondary | ICD-10-CM | POA: Diagnosis not present

## 2013-12-19 DIAGNOSIS — H532 Diplopia: Secondary | ICD-10-CM | POA: Diagnosis not present

## 2013-12-19 DIAGNOSIS — R609 Edema, unspecified: Secondary | ICD-10-CM | POA: Diagnosis not present

## 2013-12-19 DIAGNOSIS — M6281 Muscle weakness (generalized): Secondary | ICD-10-CM | POA: Diagnosis not present

## 2013-12-19 DIAGNOSIS — G20 Parkinson's disease: Secondary | ICD-10-CM

## 2013-12-19 DIAGNOSIS — J309 Allergic rhinitis, unspecified: Secondary | ICD-10-CM | POA: Diagnosis not present

## 2013-12-19 DIAGNOSIS — M810 Age-related osteoporosis without current pathological fracture: Secondary | ICD-10-CM | POA: Diagnosis not present

## 2013-12-19 DIAGNOSIS — R42 Dizziness and giddiness: Secondary | ICD-10-CM | POA: Diagnosis not present

## 2013-12-19 NOTE — Assessment & Plan Note (Addendum)
Trace edema, takes Furosemide 20mg  daily. Update CMP, CBC, TSH, and BNP

## 2013-12-19 NOTE — Assessment & Plan Note (Signed)
Gait abnormality, frequent falling-last fall 12/12/13 ED eval. Takes Sinemet 25/250 1.5 tab qid, MiraPex 1.5mg  tid.

## 2013-12-19 NOTE — Progress Notes (Signed)
Patient ID: Brian Aguirre, male   DOB: 05-10-1929, 80 y.o.   MRN: [id number]   Code Status: DNR  Allergies  Allergen Reactions  . Myrbetriq [Mirabegron] Nausea And Vomiting  . Stalevo [Carbidopa-Levodopa-Entacapone] Diarrhea    Chief Complaint  Patient presents with  . Medical Management of Chronic Issues  . Acute Visit    HPI: Patient is a 80 y.o. male seen in the SNF at [facility] today for evaluation of ED eval for left hip pain sustained from fall 12/12/13 at home and other chronic medical conditions.    12/12/13 ED eval for fell onto the left side. He has pain at the left hip with standing and ambulation. He has a history of frequent falls secondary to Parkinson's. CT left hip and X-ray left femur showed no acute osseous abnormality.      Problem List Items Addressed This Visit    Paralysis agitans    Gait abnormality, frequent falling-last fall 12/12/13 ED eval. Takes Sinemet 25/250 1.5 tab qid, MiraPex 1.5mg  tid.     Left hip pain - Primary    Sustained from fall 12/12/13, s/p ED eval, CT left hip and X-ray L femur showed no acute osseous abnormality. SNF for Rehab. [place].     Hyponatremia    12/22/13 Na 131-update BMP in one week.     GERD (gastroesophageal reflux disease)    Stable, takes Ranitidine 75mg  daily.     Edema    Trace edema, takes Furosemide 20mg  daily. Update CMP, CBC, TSH, and BNP    Constipation    Chronic, takes MiraLax daily prn, Linzess 275mcg po daily, Colace 200mg  qhs, and Senna II qhs.        Review of Systems:  Review of Systems  Constitutional: Negative for fever, chills, weight loss, malaise/fatigue and diaphoresis.  HENT: Positive for hearing loss. Negative for congestion, ear discharge, ear pain, nosebleeds, sore throat and tinnitus.   Eyes: Negative for blurred vision, double vision, photophobia, pain, discharge and redness.  Respiratory: Negative for cough, hemoptysis, sputum production, shortness  of breath, wheezing and stridor.   Cardiovascular: Positive for leg swelling. Negative for chest pain, palpitations, orthopnea, claudication and PND.       Trace BLE  Gastrointestinal: Negative for heartburn, nausea, vomiting, abdominal pain, diarrhea, constipation, blood in stool and melena.  Genitourinary: Positive for frequency. Negative for dysuria, urgency, hematuria and flank pain.  Musculoskeletal: Positive for joint pain and falls. Negative for myalgias, back pain and neck pain.       Left hip area pain.   Skin: Negative for itching and rash.       A large ecchymosis posterior left lower rib cage and flank.   Neurological: Negative for dizziness, tingling, tremors, sensory change, speech change, focal weakness, seizures, loss of consciousness, weakness and headaches.       Unsteady gait.   Endo/Heme/Allergies: Negative for environmental allergies and polydipsia. Does not bruise/bleed easily.  Psychiatric/Behavioral: Negative for depression, suicidal ideas, hallucinations, memory loss and substance abuse. The patient is not nervous/anxious and does not have insomnia.      Past Medical History  Diagnosis Date  . Parkinson disease   . Osteoporosis   . Dizziness   . Depression   . Hypertension   . Gait disturbance   . Benign enlargement of prostate   . Orthostatic hypotension   . Fracture, rib   . Memory loss    Past Surgical History  Procedure Laterality Date  . Appendectomy    . Tonsillectomy    . Hernia repair    . Cataract extraction Left    Social History:   reports that he has never smoked. He has never used smokeless tobacco. He reports that he does not drink alcohol or use illicit drugs.  Family History  Problem Relation Age of Onset  . Cancer Mother   . Heart failure Father     Medications: Patient's Medications  New Prescriptions   No medications on file  Previous Medications   ASPIRIN EC 81 MG TABLET    Take 81 mg by mouth at bedtime.   AZELASTINE HCL  (ASTEPRO) 0.15 % SOLN    Place 1 spray into both nostrils daily as needed (for allergies).   CARBIDOPA-LEVODOPA (SINEMET IR) 25-100 MG PER TABLET    Take 1 tablet by mouth 3 (three) times daily.   CARBIDOPA-LEVODOPA (SINEMET IR) 25-250 MG PER TABLET    Take 1.5 tablets by mouth 4 (four) times daily. Taken at 6 am , 9 am , 11:30 am and 2 pm   DOCUSATE SODIUM (COLACE) 100 MG CAPSULE    Take 200 mg by mouth at bedtime.    FISH OIL-OMEGA-3 FATTY ACIDS 1000 MG CAPSULE    Take 1 g by mouth daily with breakfast.    FUROSEMIDE (LASIX) 20 MG TABLET    Take 20 mg by mouth daily.    LINACLOTIDE (LINZESS) 290 MCG CAPS CAPSULE    Take 290 mcg by mouth daily before breakfast.   LUBIPROSTONE (AMITIZA) 24 MCG CAPSULE    Take 1 capsule (24 mcg total) by mouth 2 (two) times daily with a meal.   MULTIPLE VITAMIN (MULTIVITAMIN WITH MINERALS) TABS    Take 1 tablet by mouth every morning.    POLYETHYLENE GLYCOL (MIRALAX / GLYCOLAX) PACKET    Take 17 g by mouth daily as needed.   PRAMIPEXOLE (MIRAPEX) 1.5 MG TABLET    Take 1.5 mg by mouth 3 (three) times daily. Taken at 6 am , 2 pm and bedtime   PROBIOTIC PRODUCT (ALIGN) 4 MG CAPS    Take 1 capsule by mouth at bedtime.   RANITIDINE (ZANTAC) 75 MG TABLET    Take 75 mg by mouth every evening.   SELEGILINE (ELDEPRYL) 5 MG TABLET    Take 5 mg by mouth 2 (two) times daily with a meal.   SENNA (SENOKOT) 8.6 MG TABLET    Take 2 tablets by mouth at bedtime.   TRIAMCINOLONE (NASACORT AQ) 55 MCG/ACT AERO NASAL INHALER    Place 1 spray into the nose daily as needed (for allergies).   VITAMIN B-12 (CYANOCOBALAMIN) 500 MCG TABLET    Take 500 mcg by mouth every morning.    VITAMIN D, CHOLECALCIFEROL, 400 UNITS TABLET    Take 400 Units by mouth every morning.  Modified Medications   No medications on file  Discontinued Medications   No medications on file     Physical Exam: Physical Exam  Constitutional: He is oriented to person, place, and time. He appears well-developed and  well-nourished. No distress.  HENT:  Head: Normocephalic and atraumatic.  Right Ear: External ear normal.  Left Ear: External ear normal.  Nose: Nose normal.  Mouth/Throat: Oropharynx is clear and moist. No oropharyngeal exudate.  Eyes: Conjunctivae and EOM are normal. Pupils are equal, round, and reactive to light. Right eye exhibits no discharge. Left eye exhibits no discharge. No scleral icterus.  Neck: Normal range of  motion. Neck supple. No JVD present. No tracheal deviation present. No thyromegaly present.  Cardiovascular: Normal rate, regular rhythm, normal heart sounds and intact distal pulses.   No murmur heard. Pulmonary/Chest: Effort normal and breath sounds normal. No stridor. No respiratory distress. He has no wheezes. He has no rales. He exhibits no tenderness.  Abdominal: Soft. Bowel sounds are normal. He exhibits no distension and no mass. There is no tenderness. There is no rebound and no guarding.  Musculoskeletal: Normal range of motion. He exhibits edema and tenderness.  Left hip area. Trace edema BLE  Lymphadenopathy:    He has no cervical adenopathy.  Neurological: He is alert and oriented to person, place, and time. He has normal reflexes. He displays normal reflexes. He exhibits normal muscle tone. Coordination abnormal.  Mild masking facial looks.   Skin: Skin is warm and dry. No rash noted. He is not diaphoretic. No erythema. No pallor.  A large ecchymosis posterior left lower rib cage and flank.   Psychiatric: He has a normal mood and affect. His behavior is normal. Judgment and thought content normal.    Filed Vitals:   12/19/13 1334  BP: 154/86  Pulse: 82  Temp: 97.8 F (36.6 C)  TempSrc: Tympanic  Resp: 16      Labs reviewed: Basic Metabolic Panel:  Recent Labs  03/20/13 1321 12/22/13  NA 136 131*  K 4.1 3.8  CL 96*  --   CO2 26  --   GLUCOSE 84  --   BUN 22 22*  CREATININE 0.99 0.9  CALCIUM 8.9  --   TSH 1.680 1.81   Liver Function  Tests:  Recent Labs  03/20/13 1321 12/22/13  AST 22 28  ALT 6 8*  ALKPHOS 111 107  BILITOT 0.5  --   PROT 6.5  --    No results for input(s): LIPASE, AMYLASE in the last 8760 hours. No results for input(s): AMMONIA in the last 8760 hours. CBC:  Recent Labs  03/20/13 1321 06/04/13 1348 12/22/13  WBC 5.6 5.8 5.5  NEUTROABS 3.9 4.3  --   HGB 12.6 12.7 12.5*  HCT 37.2* 37.5 37*  MCV 92 94  --   PLT 195 202 272   Lipid Panel: No results for input(s): CHOL, HDL, LDLCALC, TRIG, CHOLHDL, LDLDIRECT in the last 8760 hours.  Past Procedures:  12/12/13 X-ray left femur:  IMPRESSION: Negative.  12/12/13 CT left hip:  IMPRESSION: No evidence of acute left hip fracture or dislocation.    Assessment/Plan Left hip pain Sustained from fall 12/12/13, s/p ED eval, CT left hip and X-ray L femur showed no acute osseous abnormality. SNF for Rehab. [place].   Paralysis agitans Gait abnormality, frequent falling-last fall 12/12/13 ED eval. Takes Sinemet 25/250 1.5 tab qid, MiraPex 1.5mg  tid.   Constipation Chronic, takes MiraLax daily prn, Linzess 229mcg po daily, Colace 200mg  qhs, and Senna II qhs.   GERD (gastroesophageal reflux disease) Stable, takes Ranitidine 75mg  daily.   Edema Trace edema, takes Furosemide 20mg  daily. Update CMP, CBC, TSH, and BNP  Hyponatremia 12/22/13 Na 131-update BMP in one week.     Family/ Staff Communication: observe the patient.   Goals of Care: SNF  Labs/tests ordered: CBC, CMP, BNP, TSH

## 2013-12-19 NOTE — Assessment & Plan Note (Signed)
Sustained from fall 12/12/13, s/p ED eval, CT left hip and X-ray L femur showed no acute osseous abnormality. SNF for Rehab. [place]

## 2013-12-19 NOTE — Assessment & Plan Note (Signed)
Stable, takes Ranitidine 75mg daily.    

## 2013-12-19 NOTE — Assessment & Plan Note (Signed)
Chronic, takes MiraLax daily prn, Linzess 290mcg po daily, Colace 200mg qhs, and Senna II qhs.    

## 2013-12-22 ENCOUNTER — Ambulatory Visit: Payer: Medicare Other | Admitting: Physical Therapy

## 2013-12-22 DIAGNOSIS — I10 Essential (primary) hypertension: Secondary | ICD-10-CM | POA: Diagnosis not present

## 2013-12-22 DIAGNOSIS — I5021 Acute systolic (congestive) heart failure: Secondary | ICD-10-CM | POA: Diagnosis not present

## 2013-12-22 DIAGNOSIS — E039 Hypothyroidism, unspecified: Secondary | ICD-10-CM | POA: Diagnosis not present

## 2013-12-22 DIAGNOSIS — J309 Allergic rhinitis, unspecified: Secondary | ICD-10-CM | POA: Diagnosis not present

## 2013-12-22 DIAGNOSIS — M6281 Muscle weakness (generalized): Secondary | ICD-10-CM | POA: Diagnosis not present

## 2013-12-22 DIAGNOSIS — R42 Dizziness and giddiness: Secondary | ICD-10-CM | POA: Diagnosis not present

## 2013-12-22 DIAGNOSIS — H532 Diplopia: Secondary | ICD-10-CM | POA: Diagnosis not present

## 2013-12-22 DIAGNOSIS — M810 Age-related osteoporosis without current pathological fracture: Secondary | ICD-10-CM | POA: Diagnosis not present

## 2013-12-22 DIAGNOSIS — G20 Parkinson's disease: Secondary | ICD-10-CM | POA: Diagnosis not present

## 2013-12-22 LAB — TSH: TSH: 1.81 u[IU]/mL (ref 0.41–5.90)

## 2013-12-22 LAB — HEPATIC FUNCTION PANEL
ALT: 8 U/L — AB (ref 10–40)
AST: 28 U/L (ref 14–40)
Alkaline Phosphatase: 107 U/L (ref 25–125)
Bilirubin, Total: 0.7 mg/dL

## 2013-12-22 LAB — CBC AND DIFFERENTIAL
HCT: 37 % — AB (ref 41–53)
HEMOGLOBIN: 12.5 g/dL — AB (ref 13.5–17.5)
Platelets: 272 10*3/uL (ref 150–399)
WBC: 5.5 10^3/mL

## 2013-12-22 LAB — BASIC METABOLIC PANEL
BUN: 22 mg/dL — AB (ref 4–21)
Creatinine: 0.9 mg/dL (ref 0.6–1.3)
Glucose: 82 mg/dL
POTASSIUM: 3.8 mmol/L (ref 3.4–5.3)
Sodium: 131 mmol/L — AB (ref 137–147)

## 2013-12-23 ENCOUNTER — Non-Acute Institutional Stay (SKILLED_NURSING_FACILITY): Payer: Medicare Other | Admitting: Nurse Practitioner

## 2013-12-23 ENCOUNTER — Encounter: Payer: Self-pay | Admitting: Nurse Practitioner

## 2013-12-23 DIAGNOSIS — M25552 Pain in left hip: Secondary | ICD-10-CM

## 2013-12-23 DIAGNOSIS — M810 Age-related osteoporosis without current pathological fracture: Secondary | ICD-10-CM | POA: Diagnosis not present

## 2013-12-23 DIAGNOSIS — M6281 Muscle weakness (generalized): Secondary | ICD-10-CM | POA: Diagnosis not present

## 2013-12-23 DIAGNOSIS — K5900 Constipation, unspecified: Secondary | ICD-10-CM | POA: Diagnosis not present

## 2013-12-23 DIAGNOSIS — J309 Allergic rhinitis, unspecified: Secondary | ICD-10-CM | POA: Diagnosis not present

## 2013-12-23 DIAGNOSIS — R42 Dizziness and giddiness: Secondary | ICD-10-CM | POA: Diagnosis not present

## 2013-12-23 DIAGNOSIS — E871 Hypo-osmolality and hyponatremia: Secondary | ICD-10-CM | POA: Insufficient documentation

## 2013-12-23 DIAGNOSIS — F05 Delirium due to known physiological condition: Secondary | ICD-10-CM | POA: Diagnosis not present

## 2013-12-23 DIAGNOSIS — G20 Parkinson's disease: Secondary | ICD-10-CM

## 2013-12-23 DIAGNOSIS — H532 Diplopia: Secondary | ICD-10-CM | POA: Diagnosis not present

## 2013-12-23 DIAGNOSIS — K219 Gastro-esophageal reflux disease without esophagitis: Secondary | ICD-10-CM | POA: Diagnosis not present

## 2013-12-23 DIAGNOSIS — R609 Edema, unspecified: Secondary | ICD-10-CM | POA: Diagnosis not present

## 2013-12-23 NOTE — Assessment & Plan Note (Signed)
12/22/13 Na 131. Repeat BMP

## 2013-12-23 NOTE — Assessment & Plan Note (Signed)
12/22/13 Na 131-update BMP in one week.

## 2013-12-23 NOTE — Assessment & Plan Note (Signed)
Chronic, takes MiraLax daily prn, Linzess 290mcg po daily, Colace 200mg qhs, and Senna II qhs.    

## 2013-12-23 NOTE — Assessment & Plan Note (Signed)
Trace edema, takes Furosemide 20mg daily. BNP 145.2 12/23/13   

## 2013-12-23 NOTE — Assessment & Plan Note (Signed)
Sustained from fall 12/12/13, s/p ED eval, CT left hip and X-ray L femur showed no acute osseous abnormality. SNF for Rehab. [place]

## 2013-12-23 NOTE — Assessment & Plan Note (Signed)
UA C/S to r/o UTI, update BMP to monitor Na 131 12/22/13, MMSE to eval memory. Continue to observe the patient-he seems alert today upon my visit.

## 2013-12-23 NOTE — Assessment & Plan Note (Signed)
Stable, takes Ranitidine 75mg daily.    

## 2013-12-23 NOTE — Assessment & Plan Note (Signed)
Gait abnormality, frequent falling-last fall 12/12/13 ED eval. Takes Sinemet 25/250 1.5 tab qid, MiraPex 1.5mg tid. Mask facial looks.    

## 2013-12-23 NOTE — Progress Notes (Signed)
Patient ID: Brian Aguirre, male   DOB: 07-16-1929, 80 y.o.   MRN: [id number]   Code Status: DNR  Allergies  Allergen Reactions  . Myrbetriq [Mirabegron] Nausea And Vomiting  . Stalevo [Carbidopa-Levodopa-Entacapone] Diarrhea    Chief Complaint  Patient presents with  . Medical Management of Chronic Issues  . Acute Visit    confusion.    HPI: Patient is a 80 y.o. male seen in the SNF at [facility] today for evaluation of acute confusion and other chronic medical conditions.    12/12/13 ED eval for fell onto the left side. He has pain at the left hip with standing and ambulation. He has a history of frequent falls secondary to Parkinson's. CT left hip and X-ray left femur showed no acute osseous abnormality.      Problem List Items Addressed This Visit    Paralysis agitans    Gait abnormality, frequent falling-last fall 12/12/13 ED eval. Takes Sinemet 25/250 1.5 tab qid, MiraPex 1.5mg  tid. Mask facial looks.      Left hip pain    Sustained from fall 12/12/13, s/p ED eval, CT left hip and X-ray L femur showed no acute osseous abnormality. SNF for Rehab. [place].     Hyponatremia    12/22/13 Na 131. Repeat BMP     GERD (gastroesophageal reflux disease)    Stable, takes Ranitidine 75mg  daily.      Edema - Primary    Trace edema, takes Furosemide 20mg  daily. BNP 145.2 12/23/13    Constipation    Chronic, takes MiraLax daily prn, Linzess 240mcg po daily, Colace 200mg  qhs, and Senna II qhs.      Acute confusional state    UA C/S to r/o UTI, update BMP to monitor Na 131 12/22/13, MMSE to eval memory. Continue to observe the patient-he seems alert today upon my visit.        Review of Systems:  Review of Systems  Constitutional: Negative for fever, chills, weight loss, malaise/fatigue and diaphoresis.  HENT: Positive for hearing loss. Negative for congestion, ear discharge, ear pain, nosebleeds, sore throat and tinnitus.   Eyes: Negative  for blurred vision, double vision, photophobia, pain, discharge and redness.  Respiratory: Negative for cough, hemoptysis, sputum production, shortness of breath, wheezing and stridor.   Cardiovascular: Positive for leg swelling. Negative for chest pain, palpitations, orthopnea, claudication and PND.       Trace BLE  Gastrointestinal: Negative for heartburn, nausea, vomiting, abdominal pain, diarrhea, constipation, blood in stool and melena.  Genitourinary: Positive for frequency. Negative for dysuria, urgency, hematuria and flank pain.  Musculoskeletal: Positive for joint pain and falls. Negative for myalgias, back pain and neck pain.       Left hip area pain.   Skin: Negative for itching and rash.       A large ecchymosis posterior left lower rib cage and flank.   Neurological: Negative for dizziness, tingling, tremors, sensory change, speech change, focal weakness, seizures, loss of consciousness, weakness and headaches.       Unsteady gait.   Endo/Heme/Allergies: Negative for environmental allergies and polydipsia. Does not bruise/bleed easily.  Psychiatric/Behavioral: Negative for depression, suicidal ideas, hallucinations, memory loss and substance abuse. The patient is not nervous/anxious and does not have insomnia.        Reportedly confusion-call 911, didn't know how to call for help last night.      Past Medical History  Diagnosis Date  . Parkinson  disease   . Osteoporosis   . Dizziness   . Depression   . Hypertension   . Gait disturbance   . Benign enlargement of prostate   . Orthostatic hypotension   . Fracture, rib   . Memory loss    Past Surgical History  Procedure Laterality Date  . Appendectomy    . Tonsillectomy    . Hernia repair    . Cataract extraction Left    Social History:   reports that he has never smoked. He has never used smokeless tobacco. He reports that he does not drink alcohol or use illicit drugs.  Family History  Problem Relation Age of  Onset  . Cancer Mother   . Heart failure Father     Medications: Patient's Medications  New Prescriptions   No medications on file  Previous Medications   ASPIRIN EC 81 MG TABLET    Take 81 mg by mouth at bedtime.   AZELASTINE HCL (ASTEPRO) 0.15 % SOLN    Place 1 spray into both nostrils daily as needed (for allergies).   CARBIDOPA-LEVODOPA (SINEMET IR) 25-100 MG PER TABLET    Take 1 tablet by mouth 3 (three) times daily.   CARBIDOPA-LEVODOPA (SINEMET IR) 25-250 MG PER TABLET    Take 1.5 tablets by mouth 4 (four) times daily. Taken at 6 am , 9 am , 11:30 am and 2 pm   DOCUSATE SODIUM (COLACE) 100 MG CAPSULE    Take 200 mg by mouth at bedtime.    FISH OIL-OMEGA-3 FATTY ACIDS 1000 MG CAPSULE    Take 1 g by mouth daily with breakfast.    FUROSEMIDE (LASIX) 20 MG TABLET    Take 20 mg by mouth daily.    LINACLOTIDE (LINZESS) 290 MCG CAPS CAPSULE    Take 290 mcg by mouth daily before breakfast.   LUBIPROSTONE (AMITIZA) 24 MCG CAPSULE    Take 1 capsule (24 mcg total) by mouth 2 (two) times daily with a meal.   MULTIPLE VITAMIN (MULTIVITAMIN WITH MINERALS) TABS    Take 1 tablet by mouth every morning.    POLYETHYLENE GLYCOL (MIRALAX / GLYCOLAX) PACKET    Take 17 g by mouth daily as needed.   PRAMIPEXOLE (MIRAPEX) 1.5 MG TABLET    Take 1.5 mg by mouth 3 (three) times daily. Taken at 6 am , 2 pm and bedtime   PROBIOTIC PRODUCT (ALIGN) 4 MG CAPS    Take 1 capsule by mouth at bedtime.   RANITIDINE (ZANTAC) 75 MG TABLET    Take 75 mg by mouth every evening.   SELEGILINE (ELDEPRYL) 5 MG TABLET    Take 5 mg by mouth 2 (two) times daily with a meal.   SENNA (SENOKOT) 8.6 MG TABLET    Take 2 tablets by mouth at bedtime.   TRIAMCINOLONE (NASACORT AQ) 55 MCG/ACT AERO NASAL INHALER    Place 1 spray into the nose daily as needed (for allergies).   VITAMIN B-12 (CYANOCOBALAMIN) 500 MCG TABLET    Take 500 mcg by mouth every morning.    VITAMIN D, CHOLECALCIFEROL, 400 UNITS TABLET    Take 400 Units by mouth  every morning.  Modified Medications   No medications on file  Discontinued Medications   No medications on file     Physical Exam: Physical Exam  Constitutional: He is oriented to person, place, and time. He appears well-developed and well-nourished. No distress.  HENT:  Head: Normocephalic and atraumatic.  Right Ear: External ear normal.  Left  Ear: External ear normal.  Nose: Nose normal.  Mouth/Throat: Oropharynx is clear and moist. No oropharyngeal exudate.  Eyes: Conjunctivae and EOM are normal. Pupils are equal, round, and reactive to light. Right eye exhibits no discharge. Left eye exhibits no discharge. No scleral icterus.  Neck: Normal range of motion. Neck supple. No JVD present. No tracheal deviation present. No thyromegaly present.  Cardiovascular: Normal rate, regular rhythm, normal heart sounds and intact distal pulses.   No murmur heard. Pulmonary/Chest: Effort normal and breath sounds normal. No stridor. No respiratory distress. He has no wheezes. He has no rales. He exhibits no tenderness.  Abdominal: Soft. Bowel sounds are normal. He exhibits no distension and no mass. There is no tenderness. There is no rebound and no guarding.  Musculoskeletal: Normal range of motion. He exhibits edema and tenderness.  Left hip area. Trace edema BLE  Lymphadenopathy:    He has no cervical adenopathy.  Neurological: He is alert and oriented to person, place, and time. He has normal reflexes. He exhibits normal muscle tone. Coordination abnormal.  Mild masking facial looks.   Skin: Skin is warm and dry. No rash noted. He is not diaphoretic. No erythema. No pallor.  A large ecchymosis posterior left lower rib cage and flank.   Psychiatric: He has a normal mood and affect. His behavior is normal. Judgment and thought content normal.    Filed Vitals:   12/23/13 1131  BP: 133/67  Pulse: 74  Temp: 98.1 F (36.7 C)  TempSrc: Tympanic  Resp: 18      Labs reviewed: Basic  Metabolic Panel:  Recent Labs  03/20/13 1321 12/22/13  NA 136 131*  K 4.1 3.8  CL 96*  --   CO2 26  --   GLUCOSE 84  --   BUN 22 22*  CREATININE 0.99 0.9  CALCIUM 8.9  --   TSH 1.680 1.81   Liver Function Tests:  Recent Labs  03/20/13 1321 12/22/13  AST 22 28  ALT 6 8*  ALKPHOS 111 107  BILITOT 0.5  --   PROT 6.5  --    No results for input(s): LIPASE, AMYLASE in the last 8760 hours. No results for input(s): AMMONIA in the last 8760 hours. CBC:  Recent Labs  03/20/13 1321 06/04/13 1348 12/22/13  WBC 5.6 5.8 5.5  NEUTROABS 3.9 4.3  --   HGB 12.6 12.7 12.5*  HCT 37.2* 37.5 37*  MCV 92 94  --   PLT 195 202 272   Lipid Panel: No results for input(s): CHOL, HDL, LDLCALC, TRIG, CHOLHDL, LDLDIRECT in the last 8760 hours.  Past Procedures:  12/12/13 X-ray left femur:  IMPRESSION: Negative.  12/12/13 CT left hip:  IMPRESSION: No evidence of acute left hip fracture or dislocation.    Assessment/Plan Edema Trace edema, takes Furosemide 20mg  daily. BNP 145.2 12/23/13  Constipation Chronic, takes MiraLax daily prn, Linzess 257mcg po daily, Colace 200mg  qhs, and Senna II qhs.    GERD (gastroesophageal reflux disease) Stable, takes Ranitidine 75mg  daily.    Hyponatremia 12/22/13 Na 131. Repeat BMP   Left hip pain Sustained from fall 12/12/13, s/p ED eval, CT left hip and X-ray L femur showed no acute osseous abnormality. SNF for Rehab. [place].   Paralysis agitans Gait abnormality, frequent falling-last fall 12/12/13 ED eval. Takes Sinemet 25/250 1.5 tab qid, MiraPex 1.5mg  tid. Mask facial looks.    Acute confusional state UA C/S to r/o UTI, update BMP  to monitor Na 131 12/22/13, MMSE to eval memory. Continue to observe the patient-he seems alert today upon my visit.     Family/ Staff Communication: observe the patient.   Goals of Care: SNF  Labs/tests ordered: UA C/S, MMSE

## 2013-12-24 DIAGNOSIS — R350 Frequency of micturition: Secondary | ICD-10-CM | POA: Diagnosis not present

## 2013-12-24 DIAGNOSIS — R1084 Generalized abdominal pain: Secondary | ICD-10-CM | POA: Diagnosis not present

## 2013-12-25 ENCOUNTER — Ambulatory Visit: Payer: Medicare Other | Admitting: Physical Therapy

## 2013-12-25 DIAGNOSIS — J309 Allergic rhinitis, unspecified: Secondary | ICD-10-CM | POA: Diagnosis not present

## 2013-12-25 DIAGNOSIS — M6281 Muscle weakness (generalized): Secondary | ICD-10-CM | POA: Diagnosis not present

## 2013-12-25 DIAGNOSIS — M810 Age-related osteoporosis without current pathological fracture: Secondary | ICD-10-CM | POA: Diagnosis not present

## 2013-12-25 DIAGNOSIS — H532 Diplopia: Secondary | ICD-10-CM | POA: Diagnosis not present

## 2013-12-25 DIAGNOSIS — R42 Dizziness and giddiness: Secondary | ICD-10-CM | POA: Diagnosis not present

## 2013-12-25 DIAGNOSIS — G20 Parkinson's disease: Secondary | ICD-10-CM | POA: Diagnosis not present

## 2013-12-25 DIAGNOSIS — E871 Hypo-osmolality and hyponatremia: Secondary | ICD-10-CM | POA: Diagnosis not present

## 2013-12-25 LAB — BASIC METABOLIC PANEL
BUN: 19 mg/dL (ref 4–21)
Creatinine: 0.9 mg/dL (ref 0.6–1.3)
GLUCOSE: 84 mg/dL
POTASSIUM: 4.1 mmol/L (ref 3.4–5.3)
Sodium: 134 mmol/L — AB (ref 137–147)

## 2013-12-26 DIAGNOSIS — M810 Age-related osteoporosis without current pathological fracture: Secondary | ICD-10-CM | POA: Diagnosis not present

## 2013-12-26 DIAGNOSIS — N390 Urinary tract infection, site not specified: Secondary | ICD-10-CM | POA: Diagnosis not present

## 2013-12-26 DIAGNOSIS — J309 Allergic rhinitis, unspecified: Secondary | ICD-10-CM | POA: Diagnosis not present

## 2013-12-26 DIAGNOSIS — G20 Parkinson's disease: Secondary | ICD-10-CM | POA: Diagnosis not present

## 2013-12-26 DIAGNOSIS — M6281 Muscle weakness (generalized): Secondary | ICD-10-CM | POA: Diagnosis not present

## 2013-12-26 DIAGNOSIS — H532 Diplopia: Secondary | ICD-10-CM | POA: Diagnosis not present

## 2013-12-26 DIAGNOSIS — R42 Dizziness and giddiness: Secondary | ICD-10-CM | POA: Diagnosis not present

## 2013-12-29 DIAGNOSIS — H532 Diplopia: Secondary | ICD-10-CM | POA: Diagnosis not present

## 2013-12-29 DIAGNOSIS — M6281 Muscle weakness (generalized): Secondary | ICD-10-CM | POA: Diagnosis not present

## 2013-12-29 DIAGNOSIS — M810 Age-related osteoporosis without current pathological fracture: Secondary | ICD-10-CM | POA: Diagnosis not present

## 2013-12-29 DIAGNOSIS — G20 Parkinson's disease: Secondary | ICD-10-CM | POA: Diagnosis not present

## 2013-12-29 DIAGNOSIS — R42 Dizziness and giddiness: Secondary | ICD-10-CM | POA: Diagnosis not present

## 2013-12-29 DIAGNOSIS — J309 Allergic rhinitis, unspecified: Secondary | ICD-10-CM | POA: Diagnosis not present

## 2013-12-30 ENCOUNTER — Non-Acute Institutional Stay (SKILLED_NURSING_FACILITY): Payer: Medicare Other | Admitting: Nurse Practitioner

## 2013-12-30 DIAGNOSIS — H532 Diplopia: Secondary | ICD-10-CM | POA: Diagnosis not present

## 2013-12-30 DIAGNOSIS — E871 Hypo-osmolality and hyponatremia: Secondary | ICD-10-CM | POA: Diagnosis not present

## 2013-12-30 DIAGNOSIS — J309 Allergic rhinitis, unspecified: Secondary | ICD-10-CM | POA: Diagnosis not present

## 2013-12-30 DIAGNOSIS — R42 Dizziness and giddiness: Secondary | ICD-10-CM | POA: Diagnosis not present

## 2013-12-30 DIAGNOSIS — K120 Recurrent oral aphthae: Secondary | ICD-10-CM | POA: Insufficient documentation

## 2013-12-30 DIAGNOSIS — R609 Edema, unspecified: Secondary | ICD-10-CM | POA: Diagnosis not present

## 2013-12-30 DIAGNOSIS — F05 Delirium due to known physiological condition: Secondary | ICD-10-CM | POA: Diagnosis not present

## 2013-12-30 DIAGNOSIS — M810 Age-related osteoporosis without current pathological fracture: Secondary | ICD-10-CM | POA: Diagnosis not present

## 2013-12-30 DIAGNOSIS — K219 Gastro-esophageal reflux disease without esophagitis: Secondary | ICD-10-CM | POA: Diagnosis not present

## 2013-12-30 DIAGNOSIS — M6281 Muscle weakness (generalized): Secondary | ICD-10-CM | POA: Diagnosis not present

## 2013-12-30 DIAGNOSIS — K5900 Constipation, unspecified: Secondary | ICD-10-CM

## 2013-12-30 DIAGNOSIS — G20 Parkinson's disease: Secondary | ICD-10-CM | POA: Diagnosis not present

## 2013-12-30 DIAGNOSIS — M25552 Pain in left hip: Secondary | ICD-10-CM | POA: Diagnosis not present

## 2013-12-30 NOTE — Progress Notes (Signed)
Patient ID: Brian Aguirre, male   DOB: [birth year], 80 y.o.   MRN: [id number]   Code Status: DNR  Allergies  Allergen Reactions  . Myrbetriq [Mirabegron] Nausea And Vomiting  . Stalevo [Carbidopa-Levodopa-Entacapone] Diarrhea    Chief Complaint  Patient presents with  . Medical Management of Chronic Issues  . Acute Visit    mouth sore    HPI: Patient is a 80 y.o. male seen in the SNF at [facility] today for evaluation of inner lower lip ulcer and other chronic medical conditions.    12/12/13 ED eval for fell onto the left side. He has pain at the left hip with standing and ambulation. He has a history of frequent falls secondary to Parkinson's. CT left hip and X-ray left femur showed no acute osseous abnormality.      Problem List Items Addressed This Visit    Paralysis agitans    Gait abnormality, frequent falling-last fall 12/12/13 ED eval. Takes Sinemet 25/250 1.5 tab qid, MiraPex 1.5mg  tid. Mask facial looks.       Oral aphthous ulcer - Primary    Anterior right lower inner lip-? Trauma from the tooth-the patient POA wife stated his dentist treated with topical Triamcinolone in the past-will try again and f/u dentist if no better.     Left hip pain    Sustained from fall 12/12/13, s/p ED eval, CT left hip and X-ray L femur showed no acute osseous abnormality. SNF for Rehab. [place].  12/30/13 pain is much improved.     Hyponatremia    12/22/13 Na 131 12/25/13 Na 134     GERD (gastroesophageal reflux disease)    Stable, takes Ranitidine 75mg  daily.       Edema    Trace edema, takes Furosemide 20mg  daily. BNP 145.2 12/23/13      Constipation    Chronic, takes MiraLax daily prn, Linzess 226mcg po daily, Colace 200mg  qhs, and Senna II qhs.       Acute confusional state    Baseline mentation now.        Review of Systems:  Review of Systems  Constitutional: Negative for fever, chills, weight loss, malaise/fatigue and  diaphoresis.  HENT: Positive for hearing loss. Negative for congestion, ear discharge, ear pain, nosebleeds, sore throat and tinnitus.        Right lower inner lip ulcerated area  Eyes: Negative for blurred vision, double vision, photophobia, pain, discharge and redness.  Respiratory: Negative for cough, hemoptysis, sputum production, shortness of breath, wheezing and stridor.   Cardiovascular: Positive for leg swelling. Negative for chest pain, palpitations, orthopnea, claudication and PND.       Trace BLE  Gastrointestinal: Negative for heartburn, nausea, vomiting, abdominal pain, diarrhea, constipation, blood in stool and melena.  Genitourinary: Positive for frequency. Negative for dysuria, urgency, hematuria and flank pain.  Musculoskeletal: Positive for joint pain and falls. Negative for myalgias, back pain and neck pain.       Left hip area pain.   Skin: Negative for itching and rash.       A large ecchymosis posterior left lower rib cage and flank.   Neurological: Negative for dizziness, tingling, tremors, sensory change, speech change, focal weakness, seizures, loss of consciousness, weakness and headaches.       Unsteady gait.   Endo/Heme/Allergies: Negative for environmental allergies and polydipsia. Does not bruise/bleed easily.  Psychiatric/Behavioral: Negative for depression, suicidal ideas, hallucinations, memory loss and substance abuse.  The patient is not nervous/anxious and does not have insomnia.        Reportedly confusion-call 911, didn't know how to call for help last night.      Past Medical History  Diagnosis Date  . Parkinson disease   . Osteoporosis   . Dizziness   . Depression   . Hypertension   . Gait disturbance   . Benign enlargement of prostate   . Orthostatic hypotension   . Fracture, rib   . Memory loss    Past Surgical History  Procedure Laterality Date  . Appendectomy    . Tonsillectomy    . Hernia repair    . Cataract extraction Left     Social History:   reports that he has never smoked. He has never used smokeless tobacco. He reports that he does not drink alcohol or use illicit drugs.  Family History  Problem Relation Age of Onset  . Cancer Mother   . Heart failure Father     Medications: Patient's Medications  New Prescriptions   No medications on file  Previous Medications   ASPIRIN EC 81 MG TABLET    Take 81 mg by mouth at bedtime.   AZELASTINE HCL (ASTEPRO) 0.15 % SOLN    Place 1 spray into both nostrils daily as needed (for allergies).   CARBIDOPA-LEVODOPA (SINEMET IR) 25-100 MG PER TABLET    Take 1 tablet by mouth 3 (three) times daily.   CARBIDOPA-LEVODOPA (SINEMET IR) 25-250 MG PER TABLET    Take 1.5 tablets by mouth 4 (four) times daily. Taken at 6 am , 9 am , 11:30 am and 2 pm   DOCUSATE SODIUM (COLACE) 100 MG CAPSULE    Take 200 mg by mouth at bedtime.    FISH OIL-OMEGA-3 FATTY ACIDS 1000 MG CAPSULE    Take 1 g by mouth daily with breakfast.    FUROSEMIDE (LASIX) 20 MG TABLET    Take 20 mg by mouth daily.    LINACLOTIDE (LINZESS) 290 MCG CAPS CAPSULE    Take 290 mcg by mouth daily before breakfast.   LUBIPROSTONE (AMITIZA) 24 MCG CAPSULE    Take 1 capsule (24 mcg total) by mouth 2 (two) times daily with a meal.   MULTIPLE VITAMIN (MULTIVITAMIN WITH MINERALS) TABS    Take 1 tablet by mouth every morning.    POLYETHYLENE GLYCOL (MIRALAX / GLYCOLAX) PACKET    Take 17 g by mouth daily as needed.   PRAMIPEXOLE (MIRAPEX) 1.5 MG TABLET    Take 1.5 mg by mouth 3 (three) times daily. Taken at 6 am , 2 pm and bedtime   PROBIOTIC PRODUCT (ALIGN) 4 MG CAPS    Take 1 capsule by mouth at bedtime.   RANITIDINE (ZANTAC) 75 MG TABLET    Take 75 mg by mouth every evening.   SELEGILINE (ELDEPRYL) 5 MG TABLET    Take 5 mg by mouth 2 (two) times daily with a meal.   SENNA (SENOKOT) 8.6 MG TABLET    Take 2 tablets by mouth at bedtime.   TRIAMCINOLONE (NASACORT AQ) 55 MCG/ACT AERO NASAL INHALER    Place 1 spray into the nose  daily as needed (for allergies).   VITAMIN B-12 (CYANOCOBALAMIN) 500 MCG TABLET    Take 500 mcg by mouth every morning.    VITAMIN D, CHOLECALCIFEROL, 400 UNITS TABLET    Take 400 Units by mouth every morning.  Modified Medications   No medications on file  Discontinued Medications   No  medications on file     Physical Exam: Physical Exam  Constitutional: He is oriented to person, place, and time. He appears well-developed and well-nourished. No distress.  HENT:  Head: Normocephalic and atraumatic.  Right Ear: External ear normal.  Left Ear: External ear normal.  Nose: Nose normal.  Mouth/Throat: Oropharynx is clear and moist. No oropharyngeal exudate.  Right lower inner lip ulcerated area.   Eyes: Conjunctivae and EOM are normal. Pupils are equal, round, and reactive to light. Right eye exhibits no discharge. Left eye exhibits no discharge. No scleral icterus.  Neck: Normal range of motion. Neck supple. No JVD present. No tracheal deviation present. No thyromegaly present.  Cardiovascular: Normal rate, regular rhythm, normal heart sounds and intact distal pulses.   No murmur heard. Pulmonary/Chest: Effort normal and breath sounds normal. No stridor. No respiratory distress. He has no wheezes. He has no rales. He exhibits no tenderness.  Abdominal: Soft. Bowel sounds are normal. He exhibits no distension and no mass. There is no tenderness. There is no rebound and no guarding.  Musculoskeletal: Normal range of motion. He exhibits edema and tenderness.  Left hip area. Trace edema BLE  Lymphadenopathy:    He has no cervical adenopathy.  Neurological: He is alert and oriented to person, place, and time. He has normal reflexes. He exhibits normal muscle tone. Coordination abnormal.  Mild masking facial looks.   Skin: Skin is warm and dry. No rash noted. He is not diaphoretic. No erythema. No pallor.  A large ecchymosis posterior left lower rib cage and flank.   Psychiatric: He has a  normal mood and affect. His behavior is normal. Judgment and thought content normal.    Filed Vitals:   12/30/13 1225  BP: 128/74  Pulse: 64  Temp: 97.6 F (36.4 C)  TempSrc: Tympanic  Resp: 16      Labs reviewed: Basic Metabolic Panel:  Recent Labs  03/20/13 1321 12/22/13 12/25/13  NA 136 131* 134*  K 4.1 3.8 4.1  CL 96*  --   --   CO2 26  --   --   GLUCOSE 84  --   --   BUN 22 22* 19  CREATININE 0.99 0.9 0.9  CALCIUM 8.9  --   --   TSH 1.680 1.81  --    Liver Function Tests:  Recent Labs  03/20/13 1321 12/22/13  AST 22 28  ALT 6 8*  ALKPHOS 111 107  BILITOT 0.5  --   PROT 6.5  --    No results for input(s): LIPASE, AMYLASE in the last 8760 hours. No results for input(s): AMMONIA in the last 8760 hours. CBC:  Recent Labs  03/20/13 1321 06/04/13 1348 12/22/13  WBC 5.6 5.8 5.5  NEUTROABS 3.9 4.3  --   HGB 12.6 12.7 12.5*  HCT 37.2* 37.5 37*  MCV 92 94  --   PLT 195 202 272   Lipid Panel: No results for input(s): CHOL, HDL, LDLCALC, TRIG, CHOLHDL, LDLDIRECT in the last 8760 hours.  Past Procedures:  12/12/13 X-ray left femur:  IMPRESSION: Negative.  12/12/13 CT left hip:  IMPRESSION: No evidence of acute left hip fracture or dislocation.    Assessment/Plan Oral aphthous ulcer Anterior right lower inner lip-? Trauma from the tooth-the patient POA wife stated his dentist treated with topical Triamcinolone in the past-will try again and f/u dentist if no better.   Hyponatremia 12/22/13 Na 131 12/25/13 Na 134   Paralysis agitans Gait abnormality, frequent falling-last fall  12/12/13 ED eval. Takes Sinemet 25/250 1.5 tab qid, MiraPex 1.5mg  tid. Mask facial looks.     Left hip pain Sustained from fall 12/12/13, s/p ED eval, CT left hip and X-ray L femur showed no acute osseous abnormality. SNF for Rehab. [place].  12/30/13 pain is much improved.   GERD (gastroesophageal reflux disease) Stable, takes  Ranitidine 75mg  daily.     Edema Trace edema, takes Furosemide 20mg  daily. BNP 145.2 12/23/13    Constipation Chronic, takes MiraLax daily prn, Linzess 222mcg po daily, Colace 200mg  qhs, and Senna II qhs.     Acute confusional state Baseline mentation now.     Family/ Staff Communication: observe the patient.   Goals of Care: SNF  Labs/tests ordered: none

## 2013-12-30 NOTE — Assessment & Plan Note (Signed)
Chronic, takes MiraLax daily prn, Linzess 272mcg po daily, Colace 200mg  qhs, and Senna II qhs.

## 2013-12-30 NOTE — Assessment & Plan Note (Signed)
Baseline mentation now.

## 2013-12-30 NOTE — Assessment & Plan Note (Signed)
Trace edema, takes Furosemide 20mg  daily. BNP 145.2 12/23/13

## 2013-12-30 NOTE — Assessment & Plan Note (Signed)
Gait abnormality, frequent falling-last fall 12/12/13 ED eval. Takes Sinemet 25/250 1.5 tab qid, MiraPex 1.5mg  tid. Mask facial looks.

## 2013-12-30 NOTE — Assessment & Plan Note (Signed)
Anterior right lower inner lip-? Trauma from the tooth-the patient POA wife stated his dentist treated with topical Triamcinolone in the past-will try again and f/u dentist if no better.

## 2013-12-30 NOTE — Assessment & Plan Note (Signed)
Sustained from fall 12/12/13, s/p ED eval, CT left hip and X-ray L femur showed no acute osseous abnormality. SNF for Rehab[place]  12/30/13 pain is much improved.

## 2013-12-30 NOTE — Assessment & Plan Note (Signed)
Stable, takes Ranitidine 75mg  daily.

## 2013-12-30 NOTE — Assessment & Plan Note (Signed)
12/22/13 Na 131 12/25/13 Na 134

## 2013-12-31 DIAGNOSIS — H532 Diplopia: Secondary | ICD-10-CM | POA: Diagnosis not present

## 2013-12-31 DIAGNOSIS — R42 Dizziness and giddiness: Secondary | ICD-10-CM | POA: Diagnosis not present

## 2013-12-31 DIAGNOSIS — G20 Parkinson's disease: Secondary | ICD-10-CM | POA: Diagnosis not present

## 2013-12-31 DIAGNOSIS — M810 Age-related osteoporosis without current pathological fracture: Secondary | ICD-10-CM | POA: Diagnosis not present

## 2013-12-31 DIAGNOSIS — J309 Allergic rhinitis, unspecified: Secondary | ICD-10-CM | POA: Diagnosis not present

## 2013-12-31 DIAGNOSIS — M6281 Muscle weakness (generalized): Secondary | ICD-10-CM | POA: Diagnosis not present

## 2014-01-01 DIAGNOSIS — M810 Age-related osteoporosis without current pathological fracture: Secondary | ICD-10-CM | POA: Diagnosis not present

## 2014-01-01 DIAGNOSIS — G20 Parkinson's disease: Secondary | ICD-10-CM | POA: Diagnosis not present

## 2014-01-01 DIAGNOSIS — R42 Dizziness and giddiness: Secondary | ICD-10-CM | POA: Diagnosis not present

## 2014-01-01 DIAGNOSIS — J309 Allergic rhinitis, unspecified: Secondary | ICD-10-CM | POA: Diagnosis not present

## 2014-01-01 DIAGNOSIS — H532 Diplopia: Secondary | ICD-10-CM | POA: Diagnosis not present

## 2014-01-01 DIAGNOSIS — M6281 Muscle weakness (generalized): Secondary | ICD-10-CM | POA: Diagnosis not present

## 2014-01-03 DIAGNOSIS — M25559 Pain in unspecified hip: Secondary | ICD-10-CM | POA: Diagnosis not present

## 2014-01-05 DIAGNOSIS — H532 Diplopia: Secondary | ICD-10-CM | POA: Diagnosis not present

## 2014-01-05 DIAGNOSIS — R42 Dizziness and giddiness: Secondary | ICD-10-CM | POA: Diagnosis not present

## 2014-01-05 DIAGNOSIS — M6281 Muscle weakness (generalized): Secondary | ICD-10-CM | POA: Diagnosis not present

## 2014-01-05 DIAGNOSIS — G20 Parkinson's disease: Secondary | ICD-10-CM | POA: Diagnosis not present

## 2014-01-05 DIAGNOSIS — J309 Allergic rhinitis, unspecified: Secondary | ICD-10-CM | POA: Diagnosis not present

## 2014-01-05 DIAGNOSIS — M810 Age-related osteoporosis without current pathological fracture: Secondary | ICD-10-CM | POA: Diagnosis not present

## 2014-01-06 DIAGNOSIS — M810 Age-related osteoporosis without current pathological fracture: Secondary | ICD-10-CM | POA: Diagnosis not present

## 2014-01-06 DIAGNOSIS — G20 Parkinson's disease: Secondary | ICD-10-CM | POA: Diagnosis not present

## 2014-01-06 DIAGNOSIS — J309 Allergic rhinitis, unspecified: Secondary | ICD-10-CM | POA: Diagnosis not present

## 2014-01-06 DIAGNOSIS — R42 Dizziness and giddiness: Secondary | ICD-10-CM | POA: Diagnosis not present

## 2014-01-06 DIAGNOSIS — M6281 Muscle weakness (generalized): Secondary | ICD-10-CM | POA: Diagnosis not present

## 2014-01-06 DIAGNOSIS — H532 Diplopia: Secondary | ICD-10-CM | POA: Diagnosis not present

## 2014-01-09 DIAGNOSIS — H532 Diplopia: Secondary | ICD-10-CM | POA: Diagnosis not present

## 2014-01-09 DIAGNOSIS — R42 Dizziness and giddiness: Secondary | ICD-10-CM | POA: Diagnosis not present

## 2014-01-09 DIAGNOSIS — J309 Allergic rhinitis, unspecified: Secondary | ICD-10-CM | POA: Diagnosis not present

## 2014-01-09 DIAGNOSIS — G20 Parkinson's disease: Secondary | ICD-10-CM | POA: Diagnosis not present

## 2014-01-09 DIAGNOSIS — M810 Age-related osteoporosis without current pathological fracture: Secondary | ICD-10-CM | POA: Diagnosis not present

## 2014-01-09 DIAGNOSIS — M6281 Muscle weakness (generalized): Secondary | ICD-10-CM | POA: Diagnosis not present

## 2014-01-12 DIAGNOSIS — M6281 Muscle weakness (generalized): Secondary | ICD-10-CM | POA: Diagnosis not present

## 2014-01-12 DIAGNOSIS — G20 Parkinson's disease: Secondary | ICD-10-CM | POA: Diagnosis not present

## 2014-01-12 DIAGNOSIS — J309 Allergic rhinitis, unspecified: Secondary | ICD-10-CM | POA: Diagnosis not present

## 2014-01-12 DIAGNOSIS — R42 Dizziness and giddiness: Secondary | ICD-10-CM | POA: Diagnosis not present

## 2014-01-12 DIAGNOSIS — H532 Diplopia: Secondary | ICD-10-CM | POA: Diagnosis not present

## 2014-01-12 DIAGNOSIS — M810 Age-related osteoporosis without current pathological fracture: Secondary | ICD-10-CM | POA: Diagnosis not present

## 2014-01-13 DIAGNOSIS — M810 Age-related osteoporosis without current pathological fracture: Secondary | ICD-10-CM | POA: Diagnosis not present

## 2014-01-13 DIAGNOSIS — R42 Dizziness and giddiness: Secondary | ICD-10-CM | POA: Diagnosis not present

## 2014-01-13 DIAGNOSIS — M6281 Muscle weakness (generalized): Secondary | ICD-10-CM | POA: Diagnosis not present

## 2014-01-13 DIAGNOSIS — H532 Diplopia: Secondary | ICD-10-CM | POA: Diagnosis not present

## 2014-01-13 DIAGNOSIS — G20 Parkinson's disease: Secondary | ICD-10-CM | POA: Diagnosis not present

## 2014-01-13 DIAGNOSIS — J309 Allergic rhinitis, unspecified: Secondary | ICD-10-CM | POA: Diagnosis not present

## 2014-01-14 ENCOUNTER — Telehealth: Payer: Self-pay | Admitting: Neurology

## 2014-01-14 DIAGNOSIS — M6281 Muscle weakness (generalized): Secondary | ICD-10-CM | POA: Diagnosis not present

## 2014-01-14 DIAGNOSIS — G20A1 Parkinson's disease without dyskinesia, without mention of fluctuations: Secondary | ICD-10-CM

## 2014-01-14 DIAGNOSIS — H532 Diplopia: Secondary | ICD-10-CM | POA: Diagnosis not present

## 2014-01-14 DIAGNOSIS — G4763 Sleep related bruxism: Secondary | ICD-10-CM

## 2014-01-14 DIAGNOSIS — G20 Parkinson's disease: Secondary | ICD-10-CM

## 2014-01-14 DIAGNOSIS — J309 Allergic rhinitis, unspecified: Secondary | ICD-10-CM | POA: Diagnosis not present

## 2014-01-14 DIAGNOSIS — R42 Dizziness and giddiness: Secondary | ICD-10-CM | POA: Diagnosis not present

## 2014-01-14 DIAGNOSIS — M810 Age-related osteoporosis without current pathological fracture: Secondary | ICD-10-CM | POA: Diagnosis not present

## 2014-01-14 NOTE — Telephone Encounter (Signed)
I got a call from Dr. Alpha Gula, the patient's dentist. The patient is having a lot of problems with bruxism at night. He is now chewing his inner cheek. Dr. Radford Pax indicates that this sometimes can be related to sleep apnea, and wishes to have [place] consider getting a sleep study on him. He has also recommended a muscle relaxant at night such as tizanidine. I will call the patient regarding these issues.  I called the patient. I discussed the possibility getting a sleep study, possibly going on tizanidine. He does not wish to consider these options at this time. He is having a lot of problems with diarrhea, and he is cutting back on some his medications for his bowels. He will call me if he decides he wants to pursue one of these treatment options.

## 2014-01-16 DIAGNOSIS — H532 Diplopia: Secondary | ICD-10-CM | POA: Diagnosis not present

## 2014-01-16 DIAGNOSIS — M6281 Muscle weakness (generalized): Secondary | ICD-10-CM | POA: Diagnosis not present

## 2014-01-16 DIAGNOSIS — J309 Allergic rhinitis, unspecified: Secondary | ICD-10-CM | POA: Diagnosis not present

## 2014-01-16 DIAGNOSIS — J069 Acute upper respiratory infection, unspecified: Secondary | ICD-10-CM | POA: Diagnosis not present

## 2014-01-16 DIAGNOSIS — G20 Parkinson's disease: Secondary | ICD-10-CM | POA: Diagnosis not present

## 2014-01-16 DIAGNOSIS — M810 Age-related osteoporosis without current pathological fracture: Secondary | ICD-10-CM | POA: Diagnosis not present

## 2014-01-16 DIAGNOSIS — R42 Dizziness and giddiness: Secondary | ICD-10-CM | POA: Diagnosis not present

## 2014-01-19 DIAGNOSIS — R42 Dizziness and giddiness: Secondary | ICD-10-CM | POA: Diagnosis not present

## 2014-01-19 DIAGNOSIS — G20 Parkinson's disease: Secondary | ICD-10-CM | POA: Diagnosis not present

## 2014-01-19 DIAGNOSIS — M810 Age-related osteoporosis without current pathological fracture: Secondary | ICD-10-CM | POA: Diagnosis not present

## 2014-01-19 DIAGNOSIS — M6281 Muscle weakness (generalized): Secondary | ICD-10-CM | POA: Diagnosis not present

## 2014-01-19 DIAGNOSIS — J309 Allergic rhinitis, unspecified: Secondary | ICD-10-CM | POA: Diagnosis not present

## 2014-01-19 DIAGNOSIS — H532 Diplopia: Secondary | ICD-10-CM | POA: Diagnosis not present

## 2014-01-19 NOTE — Telephone Encounter (Signed)
done

## 2014-01-20 DIAGNOSIS — M6281 Muscle weakness (generalized): Secondary | ICD-10-CM | POA: Diagnosis not present

## 2014-01-20 DIAGNOSIS — H532 Diplopia: Secondary | ICD-10-CM | POA: Diagnosis not present

## 2014-01-20 DIAGNOSIS — M810 Age-related osteoporosis without current pathological fracture: Secondary | ICD-10-CM | POA: Diagnosis not present

## 2014-01-20 DIAGNOSIS — R42 Dizziness and giddiness: Secondary | ICD-10-CM | POA: Diagnosis not present

## 2014-01-20 DIAGNOSIS — J309 Allergic rhinitis, unspecified: Secondary | ICD-10-CM | POA: Diagnosis not present

## 2014-01-20 DIAGNOSIS — G20 Parkinson's disease: Secondary | ICD-10-CM | POA: Diagnosis not present

## 2014-01-21 ENCOUNTER — Telehealth: Payer: Self-pay | Admitting: Neurology

## 2014-01-21 DIAGNOSIS — M810 Age-related osteoporosis without current pathological fracture: Secondary | ICD-10-CM | POA: Diagnosis not present

## 2014-01-21 DIAGNOSIS — H532 Diplopia: Secondary | ICD-10-CM | POA: Diagnosis not present

## 2014-01-21 DIAGNOSIS — G20 Parkinson's disease: Secondary | ICD-10-CM | POA: Diagnosis not present

## 2014-01-21 DIAGNOSIS — M6281 Muscle weakness (generalized): Secondary | ICD-10-CM | POA: Diagnosis not present

## 2014-01-21 DIAGNOSIS — J309 Allergic rhinitis, unspecified: Secondary | ICD-10-CM | POA: Diagnosis not present

## 2014-01-21 DIAGNOSIS — R42 Dizziness and giddiness: Secondary | ICD-10-CM | POA: Diagnosis not present

## 2014-01-21 NOTE — Telephone Encounter (Signed)
I called the patient. We will get a sleep study set up. His dentist wants to know if there is any association with sleep apnea with the bruxism at night. The patient is willing to undergo the procedure.

## 2014-01-21 NOTE — Addendum Note (Signed)
Addended by: Margette Fast on: 01/21/2014 03:38 PM   Modules accepted: Orders

## 2014-01-21 NOTE — Telephone Encounter (Signed)
Patient has decided to proceed with Sleep Study and would to discuss further.  Please call and advise.

## 2014-01-21 NOTE — Telephone Encounter (Signed)
Error

## 2014-01-23 DIAGNOSIS — R42 Dizziness and giddiness: Secondary | ICD-10-CM | POA: Diagnosis not present

## 2014-01-23 DIAGNOSIS — M6281 Muscle weakness (generalized): Secondary | ICD-10-CM | POA: Diagnosis not present

## 2014-01-23 DIAGNOSIS — G20 Parkinson's disease: Secondary | ICD-10-CM | POA: Diagnosis not present

## 2014-01-23 DIAGNOSIS — J309 Allergic rhinitis, unspecified: Secondary | ICD-10-CM | POA: Diagnosis not present

## 2014-01-23 DIAGNOSIS — H532 Diplopia: Secondary | ICD-10-CM | POA: Diagnosis not present

## 2014-01-23 DIAGNOSIS — M810 Age-related osteoporosis without current pathological fracture: Secondary | ICD-10-CM | POA: Diagnosis not present

## 2014-01-26 DIAGNOSIS — G20 Parkinson's disease: Secondary | ICD-10-CM | POA: Diagnosis not present

## 2014-01-26 DIAGNOSIS — J309 Allergic rhinitis, unspecified: Secondary | ICD-10-CM | POA: Diagnosis not present

## 2014-01-26 DIAGNOSIS — M810 Age-related osteoporosis without current pathological fracture: Secondary | ICD-10-CM | POA: Diagnosis not present

## 2014-01-26 DIAGNOSIS — M6281 Muscle weakness (generalized): Secondary | ICD-10-CM | POA: Diagnosis not present

## 2014-01-26 DIAGNOSIS — H532 Diplopia: Secondary | ICD-10-CM | POA: Diagnosis not present

## 2014-01-26 DIAGNOSIS — R42 Dizziness and giddiness: Secondary | ICD-10-CM | POA: Diagnosis not present

## 2014-01-28 DIAGNOSIS — R42 Dizziness and giddiness: Secondary | ICD-10-CM | POA: Diagnosis not present

## 2014-01-28 DIAGNOSIS — H532 Diplopia: Secondary | ICD-10-CM | POA: Diagnosis not present

## 2014-01-28 DIAGNOSIS — M810 Age-related osteoporosis without current pathological fracture: Secondary | ICD-10-CM | POA: Diagnosis not present

## 2014-01-28 DIAGNOSIS — J309 Allergic rhinitis, unspecified: Secondary | ICD-10-CM | POA: Diagnosis not present

## 2014-01-28 DIAGNOSIS — M6281 Muscle weakness (generalized): Secondary | ICD-10-CM | POA: Diagnosis not present

## 2014-01-28 DIAGNOSIS — G20 Parkinson's disease: Secondary | ICD-10-CM | POA: Diagnosis not present

## 2014-01-29 DIAGNOSIS — G20 Parkinson's disease: Secondary | ICD-10-CM | POA: Diagnosis not present

## 2014-01-29 DIAGNOSIS — R42 Dizziness and giddiness: Secondary | ICD-10-CM | POA: Diagnosis not present

## 2014-01-29 DIAGNOSIS — J309 Allergic rhinitis, unspecified: Secondary | ICD-10-CM | POA: Diagnosis not present

## 2014-01-29 DIAGNOSIS — M810 Age-related osteoporosis without current pathological fracture: Secondary | ICD-10-CM | POA: Diagnosis not present

## 2014-01-29 DIAGNOSIS — M6281 Muscle weakness (generalized): Secondary | ICD-10-CM | POA: Diagnosis not present

## 2014-01-29 DIAGNOSIS — H532 Diplopia: Secondary | ICD-10-CM | POA: Diagnosis not present

## 2014-02-02 DIAGNOSIS — M6281 Muscle weakness (generalized): Secondary | ICD-10-CM | POA: Diagnosis not present

## 2014-02-02 DIAGNOSIS — J309 Allergic rhinitis, unspecified: Secondary | ICD-10-CM | POA: Diagnosis not present

## 2014-02-02 DIAGNOSIS — G20 Parkinson's disease: Secondary | ICD-10-CM | POA: Diagnosis not present

## 2014-02-02 DIAGNOSIS — M810 Age-related osteoporosis without current pathological fracture: Secondary | ICD-10-CM | POA: Diagnosis not present

## 2014-02-02 DIAGNOSIS — R42 Dizziness and giddiness: Secondary | ICD-10-CM | POA: Diagnosis not present

## 2014-02-02 DIAGNOSIS — H532 Diplopia: Secondary | ICD-10-CM | POA: Diagnosis not present

## 2014-02-04 DIAGNOSIS — M6281 Muscle weakness (generalized): Secondary | ICD-10-CM | POA: Diagnosis not present

## 2014-02-04 DIAGNOSIS — G20 Parkinson's disease: Secondary | ICD-10-CM | POA: Diagnosis not present

## 2014-02-04 DIAGNOSIS — M810 Age-related osteoporosis without current pathological fracture: Secondary | ICD-10-CM | POA: Diagnosis not present

## 2014-02-04 DIAGNOSIS — R42 Dizziness and giddiness: Secondary | ICD-10-CM | POA: Diagnosis not present

## 2014-02-04 DIAGNOSIS — H532 Diplopia: Secondary | ICD-10-CM | POA: Diagnosis not present

## 2014-02-04 DIAGNOSIS — J309 Allergic rhinitis, unspecified: Secondary | ICD-10-CM | POA: Diagnosis not present

## 2014-02-06 DIAGNOSIS — M6281 Muscle weakness (generalized): Secondary | ICD-10-CM | POA: Diagnosis not present

## 2014-02-06 DIAGNOSIS — G20 Parkinson's disease: Secondary | ICD-10-CM | POA: Diagnosis not present

## 2014-02-06 DIAGNOSIS — M810 Age-related osteoporosis without current pathological fracture: Secondary | ICD-10-CM | POA: Diagnosis not present

## 2014-02-06 DIAGNOSIS — H532 Diplopia: Secondary | ICD-10-CM | POA: Diagnosis not present

## 2014-02-06 DIAGNOSIS — R42 Dizziness and giddiness: Secondary | ICD-10-CM | POA: Diagnosis not present

## 2014-02-06 DIAGNOSIS — J309 Allergic rhinitis, unspecified: Secondary | ICD-10-CM | POA: Diagnosis not present

## 2014-02-09 DIAGNOSIS — R1312 Dysphagia, oropharyngeal phase: Secondary | ICD-10-CM | POA: Diagnosis not present

## 2014-02-09 DIAGNOSIS — R42 Dizziness and giddiness: Secondary | ICD-10-CM | POA: Diagnosis not present

## 2014-02-09 DIAGNOSIS — M6281 Muscle weakness (generalized): Secondary | ICD-10-CM | POA: Diagnosis not present

## 2014-02-09 DIAGNOSIS — M25552 Pain in left hip: Secondary | ICD-10-CM | POA: Diagnosis not present

## 2014-02-09 DIAGNOSIS — R2681 Unsteadiness on feet: Secondary | ICD-10-CM | POA: Diagnosis not present

## 2014-02-09 DIAGNOSIS — R1310 Dysphagia, unspecified: Secondary | ICD-10-CM | POA: Diagnosis not present

## 2014-02-11 DIAGNOSIS — R42 Dizziness and giddiness: Secondary | ICD-10-CM | POA: Diagnosis not present

## 2014-02-11 DIAGNOSIS — R1312 Dysphagia, oropharyngeal phase: Secondary | ICD-10-CM | POA: Diagnosis not present

## 2014-02-11 DIAGNOSIS — R1310 Dysphagia, unspecified: Secondary | ICD-10-CM | POA: Diagnosis not present

## 2014-02-11 DIAGNOSIS — R2681 Unsteadiness on feet: Secondary | ICD-10-CM | POA: Diagnosis not present

## 2014-02-11 DIAGNOSIS — M6281 Muscle weakness (generalized): Secondary | ICD-10-CM | POA: Diagnosis not present

## 2014-02-11 DIAGNOSIS — M25552 Pain in left hip: Secondary | ICD-10-CM | POA: Diagnosis not present

## 2014-02-20 ENCOUNTER — Other Ambulatory Visit: Payer: Self-pay

## 2014-02-20 MED ORDER — LUBIPROSTONE 24 MCG PO CAPS: 24.0000 ug | ORAL_CAPSULE | Freq: Two times a day (BID) | ORAL | Status: DC

## 2014-02-26 ENCOUNTER — Encounter: Payer: Self-pay | Admitting: Neurology

## 2014-02-26 ENCOUNTER — Ambulatory Visit (INDEPENDENT_AMBULATORY_CARE_PROVIDER_SITE_OTHER): Payer: Medicare Other | Admitting: Neurology

## 2014-02-26 VITALS — BP 148/75 | HR 72 | Ht 68.0 in | Wt 160.2 lb

## 2014-02-26 DIAGNOSIS — I951 Orthostatic hypotension: Secondary | ICD-10-CM

## 2014-02-26 DIAGNOSIS — R269 Unspecified abnormalities of gait and mobility: Secondary | ICD-10-CM

## 2014-02-26 DIAGNOSIS — G4763 Sleep related bruxism: Secondary | ICD-10-CM | POA: Diagnosis not present

## 2014-02-26 DIAGNOSIS — G20 Parkinson's disease: Secondary | ICD-10-CM | POA: Diagnosis not present

## 2014-02-26 DIAGNOSIS — K5900 Constipation, unspecified: Secondary | ICD-10-CM

## 2014-02-26 DIAGNOSIS — G20A1 Parkinson's disease without dyskinesia, without mention of fluctuations: Secondary | ICD-10-CM

## 2014-02-26 DIAGNOSIS — K5909 Other constipation: Secondary | ICD-10-CM

## 2014-02-26 HISTORY — DX: Other constipation: K59.09

## 2014-02-26 NOTE — Patient Instructions (Signed)

## 2014-02-26 NOTE — Progress Notes (Signed)
Reason for visit: Parkinson's disease  Brian Aguirre is an 81 y.o. male  History of present illness:  Brian Aguirre is an 81 year old right-handed white male with a history of Parkinson's disease. The patient has a gait disorder, he has fallen on several occasions, he is now using a walker for ambulation. He has chronic constipation issues that are dominating his life at this point. The patient has orthostatic hypotension, he will have blood pressures commonly in the 85O systolic early in the morning, as the day progresses, the blood pressure will normalize. He has recently been to 2 or 3 weeks of rehabilitation which has helped his walking. He still freezes on occasion. He is having some issues with hallucinations on the medication. He has hypophonia, and he has had speech therapy for this in the past, he has not been doing his exercises on a regular basis. The patient is on Amitiza and Senokot for his constipation, but he still has a lot of problems. The patient returns to this office for an evaluation. He remains on Sinemet, Mirapex, and selegiline for his Parkinson's disease.  Past Medical History  Diagnosis Date  . Parkinson disease   . Osteoporosis   . Dizziness   . Depression   . Hypertension   . Gait disturbance   . Benign enlargement of prostate   . Orthostatic hypotension   . Fracture, rib   . Memory loss   . Chronic constipation 02/26/2014    Past Surgical History  Procedure Laterality Date  . Appendectomy    . Tonsillectomy    . Hernia repair    . Cataract extraction Left     Family History  Problem Relation Age of Onset  . Cancer Mother   . Heart failure Father     Social history:  reports that he has never smoked. He has never used smokeless tobacco. He reports that he does not drink alcohol or use illicit drugs.    Allergies  Allergen Reactions  . Myrbetriq [Mirabegron] Nausea And Vomiting  . Stalevo [Carbidopa-Levodopa-Entacapone] Diarrhea    Medications:   Current Outpatient Prescriptions on File Prior to Visit  Medication Sig Dispense Refill  . aspirin EC 81 MG tablet Take 81 mg by mouth at bedtime.    . carbidopa-levodopa (SINEMET IR) 25-250 MG per tablet Take 1.5 tablets by mouth 4 (four) times daily. Taken at 6 am , 9 am , 11:30 am and 2 pm    . docusate sodium (COLACE) 100 MG capsule Take 200 mg by mouth at bedtime.     . fish oil-omega-3 fatty acids 1000 MG capsule Take 1 g by mouth daily with breakfast.     . furosemide (LASIX) 20 MG tablet Take 20 mg by mouth daily.     Marland Kitchen lubiprostone (AMITIZA) 24 MCG capsule Take 1 capsule (24 mcg total) by mouth 2 (two) times daily with a meal. 60 capsule 3  . Multiple Vitamin (MULTIVITAMIN WITH MINERALS) TABS Take 1 tablet by mouth every morning.     . polyethylene glycol (MIRALAX / GLYCOLAX) packet Take 17 g by mouth daily as needed.    . pramipexole (MIRAPEX) 1.5 MG tablet Take 1.5 mg by mouth 3 (three) times daily. Taken at 6 am , 2 pm and bedtime    . Probiotic Product (ALIGN) 4 MG CAPS Take 1 capsule by mouth at bedtime.    . ranitidine (ZANTAC) 75 MG tablet Take 75 mg by mouth as needed.     Marland Kitchen  selegiline (ELDEPRYL) 5 MG tablet Take 5 mg by mouth 2 (two) times daily with a meal.    . senna (SENOKOT) 8.6 MG tablet Take 2 tablets by mouth at bedtime.    . triamcinolone (NASACORT AQ) 55 MCG/ACT AERO nasal inhaler Place 1 spray into the nose daily as needed (for allergies).    . vitamin B-12 (CYANOCOBALAMIN) 500 MCG tablet Take 500 mcg by mouth every morning.     . vitamin D, CHOLECALCIFEROL, 400 UNITS tablet Take 400 Units by mouth every morning.     No current facility-administered medications on file prior to visit.    ROS:  Out of a complete 14 system review of symptoms, the patient complains only of the following symptoms, and all other reviewed systems are negative.  Fatigue Drooling Double vision Cough Leg swelling Constipation Daytime sleepiness, sleep talking Urinary  urgency Joint swelling Bruising easily Dizziness, speech difficulty, weakness Confusion, hallucinations  Blood pressure 148/75, pulse 72, height 5\' 8"  (1.727 m), weight 160 lb 3.2 oz (72.666 kg).   Blood pressure, sitting, right arm is 154/90. Blood pressure, standing, right arm is 150/90.  Physical Exam  General: The patient is alert and cooperative at the time of the examination.  Skin: No significant peripheral edema is noted.   Neurologic Exam  Mental status: The patient is oriented x 3.  Cranial nerves: Facial symmetry is present. Speech is hypophonic. Extraocular movements are full. Visual fields are full, with exception that there is a restriction of superior gaze. Masking of the face is seen. Occasionally, a jaw tremor is seen.  Motor: The patient has good strength in all 4 extremities.  Sensory examination: Soft touch sensation is symmetric on the face, arms, and legs.  Coordination: The patient has good finger-nose-finger and heel-to-shin bilaterally. Some apraxia with the use of the lower extremities is seen.  Gait and station: The patient has difficulty arising from a seated position, he requires some assistance. He walks with a walker. Tandem gait was not attempted. The patient has a short shuffling gait, difficulty with turns, occasional freezing episodes. Posture is stooped.  Reflexes: Deep tendon reflexes are symmetric.   Assessment/Plan:  1. Parkinson's disease  2. Gait disorder  3. Chronic constipation  4. Orthostatic hypotension  5. Nocturnal bruxism  The patient's dentist has requested a sleep study to evaluate the nocturnal bruxism. The patient continues to have severe constipation. He will increase fluids, and fiber in the diet. The patient will be continued on his medications for Parkinson's disease. The patient has a sensation of dizziness with standing, but blood pressures today do not show orthostasis. He will follow-up in 4 months. He will be  referred for a sleep study. He will do his speech exercises at home.   Brian Alexanders MD 02/26/2014 8:35 PM  [facility] [address] [place],  [address]  Phone [phone] Fax [phone]

## 2014-03-06 DIAGNOSIS — K5900 Constipation, unspecified: Secondary | ICD-10-CM | POA: Diagnosis not present

## 2014-03-06 DIAGNOSIS — Z79899 Other long term (current) drug therapy: Secondary | ICD-10-CM | POA: Diagnosis not present

## 2014-03-06 DIAGNOSIS — R5383 Other fatigue: Secondary | ICD-10-CM | POA: Diagnosis not present

## 2014-03-06 DIAGNOSIS — J309 Allergic rhinitis, unspecified: Secondary | ICD-10-CM | POA: Diagnosis not present

## 2014-03-06 DIAGNOSIS — K219 Gastro-esophageal reflux disease without esophagitis: Secondary | ICD-10-CM | POA: Diagnosis not present

## 2014-03-06 DIAGNOSIS — G20 Parkinson's disease: Secondary | ICD-10-CM | POA: Diagnosis not present

## 2014-03-09 ENCOUNTER — Encounter (HOSPITAL_COMMUNITY): Payer: Self-pay | Admitting: Emergency Medicine

## 2014-03-09 ENCOUNTER — Emergency Department (HOSPITAL_COMMUNITY)
Admission: EM | Admit: 2014-03-09 | Discharge: 2014-03-09 | Disposition: A | Discharge status: Home / Self Care | Payer: Medicare Other | Attending: Emergency Medicine | Admitting: Emergency Medicine

## 2014-03-09 ENCOUNTER — Other Ambulatory Visit: Payer: Self-pay

## 2014-03-09 ENCOUNTER — Emergency Department (HOSPITAL_COMMUNITY): Payer: Medicare Other

## 2014-03-09 ENCOUNTER — Telehealth: Payer: Self-pay | Admitting: Neurology

## 2014-03-09 DIAGNOSIS — Z79899 Other long term (current) drug therapy: Secondary | ICD-10-CM | POA: Insufficient documentation

## 2014-03-09 DIAGNOSIS — G20 Parkinson's disease: Secondary | ICD-10-CM | POA: Diagnosis not present

## 2014-03-09 DIAGNOSIS — M810 Age-related osteoporosis without current pathological fracture: Secondary | ICD-10-CM | POA: Insufficient documentation

## 2014-03-09 DIAGNOSIS — Y998 Other external cause status: Secondary | ICD-10-CM | POA: Diagnosis not present

## 2014-03-09 DIAGNOSIS — Z7982 Long term (current) use of aspirin: Secondary | ICD-10-CM | POA: Diagnosis not present

## 2014-03-09 DIAGNOSIS — Y92192 Bathroom in other specified residential institution as the place of occurrence of the external cause: Secondary | ICD-10-CM | POA: Insufficient documentation

## 2014-03-09 DIAGNOSIS — W19XXXA Unspecified fall, initial encounter: Secondary | ICD-10-CM

## 2014-03-09 DIAGNOSIS — Z8781 Personal history of (healed) traumatic fracture: Secondary | ICD-10-CM | POA: Insufficient documentation

## 2014-03-09 DIAGNOSIS — Y9389 Activity, other specified: Secondary | ICD-10-CM | POA: Insufficient documentation

## 2014-03-09 DIAGNOSIS — R05 Cough: Secondary | ICD-10-CM | POA: Diagnosis not present

## 2014-03-09 DIAGNOSIS — S0083XA Contusion of other part of head, initial encounter: Secondary | ICD-10-CM

## 2014-03-09 DIAGNOSIS — Z87448 Personal history of other diseases of urinary system: Secondary | ICD-10-CM | POA: Insufficient documentation

## 2014-03-09 DIAGNOSIS — W1812XA Fall from or off toilet with subsequent striking against object, initial encounter: Secondary | ICD-10-CM | POA: Diagnosis not present

## 2014-03-09 DIAGNOSIS — R220 Localized swelling, mass and lump, head: Secondary | ICD-10-CM | POA: Diagnosis not present

## 2014-03-09 DIAGNOSIS — S0093XA Contusion of unspecified part of head, initial encounter: Secondary | ICD-10-CM | POA: Diagnosis not present

## 2014-03-09 DIAGNOSIS — F329 Major depressive disorder, single episode, unspecified: Secondary | ICD-10-CM | POA: Insufficient documentation

## 2014-03-09 DIAGNOSIS — I10 Essential (primary) hypertension: Secondary | ICD-10-CM | POA: Insufficient documentation

## 2014-03-09 DIAGNOSIS — Z9181 History of falling: Secondary | ICD-10-CM

## 2014-03-09 DIAGNOSIS — R059 Cough, unspecified: Secondary | ICD-10-CM

## 2014-03-09 DIAGNOSIS — K5900 Constipation, unspecified: Secondary | ICD-10-CM | POA: Insufficient documentation

## 2014-03-09 DIAGNOSIS — S0003XA Contusion of scalp, initial encounter: Secondary | ICD-10-CM | POA: Diagnosis not present

## 2014-03-09 DIAGNOSIS — S199XXA Unspecified injury of neck, initial encounter: Secondary | ICD-10-CM | POA: Diagnosis not present

## 2014-03-09 DIAGNOSIS — S0990XA Unspecified injury of head, initial encounter: Secondary | ICD-10-CM | POA: Diagnosis present

## 2014-03-09 DIAGNOSIS — S064X0A Epidural hemorrhage without loss of consciousness, initial encounter: Secondary | ICD-10-CM | POA: Diagnosis not present

## 2014-03-09 HISTORY — DX: Contusion of other part of head, initial encounter: S00.83XA

## 2014-03-09 HISTORY — DX: History of falling: Z91.81

## 2014-03-09 LAB — CBC WITH DIFFERENTIAL/PLATELET
BASOS ABS: 0 10*3/uL (ref 0.0–0.1)
Basophils Relative: 1 % (ref 0–1)
EOS PCT: 4 % (ref 0–5)
Eosinophils Absolute: 0.3 10*3/uL (ref 0.0–0.7)
HEMATOCRIT: 37.1 % — AB (ref 39.0–52.0)
HEMOGLOBIN: 12.1 g/dL — AB (ref 13.0–17.0)
LYMPHS ABS: 0.8 10*3/uL (ref 0.7–4.0)
Lymphocytes Relative: 13 % (ref 12–46)
MCH: 29.8 pg (ref 26.0–34.0)
MCHC: 32.6 g/dL (ref 30.0–36.0)
MCV: 91.4 fL (ref 78.0–100.0)
MONO ABS: 0.5 10*3/uL (ref 0.1–1.0)
Monocytes Relative: 9 % (ref 3–12)
NEUTROS PCT: 73 % (ref 43–77)
Neutro Abs: 4.7 10*3/uL (ref 1.7–7.7)
Platelets: 223 10*3/uL (ref 150–400)
RBC: 4.06 MIL/uL — AB (ref 4.22–5.81)
RDW: 13.9 % (ref 11.5–15.5)
WBC: 6.3 10*3/uL (ref 4.0–10.5)

## 2014-03-09 LAB — URINALYSIS, ROUTINE W REFLEX MICROSCOPIC
Bilirubin Urine: NEGATIVE
Glucose, UA: NEGATIVE mg/dL
HGB URINE DIPSTICK: NEGATIVE
Ketones, ur: NEGATIVE mg/dL
LEUKOCYTES UA: NEGATIVE
NITRITE: NEGATIVE
Protein, ur: NEGATIVE mg/dL
Specific Gravity, Urine: 1.01 (ref 1.005–1.030)
UROBILINOGEN UA: 0.2 mg/dL (ref 0.0–1.0)
pH: 6.5 (ref 5.0–8.0)

## 2014-03-09 LAB — BASIC METABOLIC PANEL
Anion gap: 7 (ref 5–15)
BUN: 20 mg/dL (ref 6–23)
CHLORIDE: 99 mmol/L (ref 96–112)
CO2: 28 mmol/L (ref 19–32)
Calcium: 8.8 mg/dL (ref 8.4–10.5)
Creatinine, Ser: 0.76 mg/dL (ref 0.50–1.35)
GFR calc non Af Amer: 83 mL/min — ABNORMAL LOW (ref >90)
Glucose, Bld: 99 mg/dL (ref 70–99)
Potassium: 3.8 mmol/L (ref 3.5–5.1)
Sodium: 134 mmol/L — ABNORMAL LOW (ref 135–145)

## 2014-03-09 LAB — I-STAT TROPONIN, ED: Troponin i, poc: 0.01 ng/mL (ref 0.00–0.08)

## 2014-03-09 MED ORDER — BACITRACIN 500 UNIT/GM EX OINT
1.0000 "application " | TOPICAL_OINTMENT | Freq: Two times a day (BID) | CUTANEOUS | Status: DC
  Administered 2014-03-09: 1 via TOPICAL
  Filled 2014-03-09 (×2): qty 0.9

## 2014-03-09 NOTE — Progress Notes (Signed)
CSW received call from [facility] social worker, Narda Rutherford who shares that patient is a resident in independent living, however family is interested in patient moving to skilled nursing in [facility] due to patient. CSW consulted with PA regarding disposition. Plan is for patietn to return to Independent with 24 hour care through [facility] or to skilled nursing. Pt daughter waiting for call back from [facility] to determine. CSW awaiting further information.   Noreene Larsson [phone]  ED CSW 03/09/2014 2:49 PM

## 2014-03-09 NOTE — ED Notes (Signed)
Bed: [facility] Expected date:  Expected time:  Means of arrival:  Comments: EMS- elderly, fell off toilet

## 2014-03-09 NOTE — ED Provider Notes (Signed)
CSN: [id number]     Arrival date & time 03/09/14  1004 History   First MD Initiated Contact with Patient 03/09/14 1029     Chief Complaint  Patient presents with  . Fall     (Consider location/radiation/quality/duration/timing/severity/associated sxs/prior Treatment) HPI Comments: Patient with past medical history of Parkinson's disease, hypertension, orthostatic hypotension, presents to the emergency department with chief complaint of mechanical fall versus syncope. Patient was reportedly sitting on toilet, and "fell asleep," causing him to fall off. He states that he hit his head and has a mild headache. He does not complain of any neck pain, back pain, or pain in his extremities. He does not take any blood thinners. He reportedly has been falling asleep at inappropriate times per his wife.  The history is provided by the patient. No language interpreter was used.    Past Medical History  Diagnosis Date  . Parkinson disease   . Osteoporosis   . Dizziness   . Depression   . Hypertension   . Gait disturbance   . Benign enlargement of prostate   . Orthostatic hypotension   . Fracture, rib   . Memory loss   . Chronic constipation 02/26/2014   Past Surgical History  Procedure Laterality Date  . Appendectomy    . Tonsillectomy    . Hernia repair    . Cataract extraction Left    Family History  Problem Relation Age of Onset  . Cancer Mother   . Heart failure Father    History  Substance Use Topics  . Smoking status: Never Smoker   . Smokeless tobacco: Never Used  . Alcohol Use: No    Review of Systems  Constitutional: Negative for fever and chills.  Respiratory: Negative for shortness of breath.   Cardiovascular: Negative for chest pain.  Gastrointestinal: Negative for nausea, vomiting, diarrhea and constipation.  Genitourinary: Negative for dysuria.  Skin: Positive for wound.  All other systems reviewed and are negative.     Allergies  Myrbetriq and  Stalevo  Home Medications   Prior to Admission medications   Medication Sig Start Date End Date Taking? Authorizing Provider  aspirin EC 81 MG tablet Take 81 mg by mouth at bedtime.    Historical Provider, MD  carbidopa-levodopa (SINEMET IR) 25-250 MG per tablet Take 1.5 tablets by mouth 4 (four) times daily. Taken at 6 am , 9 am , 11:30 am and 2 pm    Historical Provider, MD  docusate sodium (COLACE) 100 MG capsule Take 200 mg by mouth at bedtime.     Historical Provider, MD  fish oil-omega-3 fatty acids 1000 MG capsule Take 1 g by mouth daily with breakfast.     Historical Provider, MD  furosemide (LASIX) 20 MG tablet Take 20 mg by mouth daily.  10/30/13   Historical Provider, MD  lubiprostone (AMITIZA) 24 MCG capsule Take 1 capsule (24 mcg total) by mouth 2 (two) times daily with a meal. 02/20/14   Kathrynn Ducking, MD  Multiple Vitamin (MULTIVITAMIN WITH MINERALS) TABS Take 1 tablet by mouth every morning.     Historical Provider, MD  polyethylene glycol (MIRALAX / GLYCOLAX) packet Take 17 g by mouth daily as needed.    Historical Provider, MD  pramipexole (MIRAPEX) 1.5 MG tablet Take 1.5 mg by mouth 3 (three) times daily. Taken at 6 am , 2 pm and bedtime    Historical Provider, MD  Probiotic Product (ALIGN) 4 MG CAPS Take 1 capsule by mouth at bedtime.  Historical Provider, MD  ranitidine (ZANTAC) 75 MG tablet Take 75 mg by mouth as needed.     Historical Provider, MD  selegiline (ELDEPRYL) 5 MG tablet Take 5 mg by mouth 2 (two) times daily with a meal.    Historical Provider, MD  senna (SENOKOT) 8.6 MG tablet Take 2 tablets by mouth at bedtime.    Historical Provider, MD  triamcinolone (NASACORT AQ) 55 MCG/ACT AERO nasal inhaler Place 1 spray into the nose daily as needed (for allergies).    Historical Provider, MD  vitamin B-12 (CYANOCOBALAMIN) 500 MCG tablet Take 500 mcg by mouth every morning.     Historical Provider, MD  vitamin D, CHOLECALCIFEROL, 400 UNITS tablet Take 400 Units by  mouth every morning.    Historical Provider, MD   BP 143/64 mmHg  Pulse 60  Temp(Src) 98.6 F (37 C) (Oral)  Resp 16  SpO2 98% Physical Exam  Constitutional: He is oriented to person, place, and time. He appears well-developed and well-nourished.  HENT:  Head: Normocephalic and atraumatic.  4 x 4 centimeter contusion to right forehead, mild skin abrasion, no laceration  Eyes: Conjunctivae and EOM are normal. Pupils are equal, round, and reactive to light. Right eye exhibits no discharge. Left eye exhibits no discharge. No scleral icterus.  Neck: Normal range of motion. Neck supple. No JVD present.  Cardiovascular: Normal rate, regular rhythm and normal heart sounds.  Exam reveals no gallop and no friction rub.   No murmur heard. Pulmonary/Chest: Effort normal and breath sounds normal. No respiratory distress. He has no wheezes. He has no rales. He exhibits no tenderness.  Abdominal: Soft. He exhibits no distension and no mass. There is no tenderness. There is no rebound and no guarding.  No focal abdominal tenderness, no RLQ tenderness or pain at McBurney's point, no RUQ tenderness or Murphy's sign, no left-sided abdominal tenderness, no fluid wave, or signs of peritonitis   Musculoskeletal: Normal range of motion. He exhibits no edema or tenderness.  Pain-free range of motion and strength of all extremities  No spine or neck tenderness  Neurological: He is alert and oriented to person, place, and time.  Skin: Skin is warm and dry.  Psychiatric: He has a normal mood and affect. His behavior is normal. Judgment and thought content normal.  Nursing note and vitals reviewed.   ED Course  Procedures (including critical care time) Results for orders placed or performed during the hospital encounter of 03/09/14  CBC with Differential/Platelet  Result Value Ref Range   WBC 6.3 4.0 - 10.5 K/uL   RBC 4.06 (L) 4.22 - 5.81 MIL/uL   Hemoglobin 12.1 (L) 13.0 - 17.0 g/dL   HCT 37.1 (L) 39.0  - 52.0 %   MCV 91.4 78.0 - 100.0 fL   MCH 29.8 26.0 - 34.0 pg   MCHC 32.6 30.0 - 36.0 g/dL   RDW 13.9 11.5 - 15.5 %   Platelets 223 150 - 400 K/uL   Neutrophils Relative % 73 43 - 77 %   Neutro Abs 4.7 1.7 - 7.7 K/uL   Lymphocytes Relative 13 12 - 46 %   Lymphs Abs 0.8 0.7 - 4.0 K/uL   Monocytes Relative 9 3 - 12 %   Monocytes Absolute 0.5 0.1 - 1.0 K/uL   Eosinophils Relative 4 0 - 5 %   Eosinophils Absolute 0.3 0.0 - 0.7 K/uL   Basophils Relative 1 0 - 1 %   Basophils Absolute 0.0 0.0 - 0.1 K/uL  Basic metabolic panel  Result Value Ref Range   Sodium 134 (L) 135 - 145 mmol/L   Potassium 3.8 3.5 - 5.1 mmol/L   Chloride 99 96 - 112 mmol/L   CO2 28 19 - 32 mmol/L   Glucose, Bld 99 70 - 99 mg/dL   BUN 20 6 - 23 mg/dL   Creatinine, Ser 0.76 0.50 - 1.35 mg/dL   Calcium 8.8 8.4 - 10.5 mg/dL   GFR calc non Af Amer 83 (L) >90 mL/min   GFR calc Af Amer >90 >90 mL/min   Anion gap 7 5 - 15  Urinalysis, Routine w reflex microscopic  Result Value Ref Range   Color, Urine YELLOW YELLOW   APPearance CLEAR CLEAR   Specific Gravity, Urine 1.010 1.005 - 1.030   pH 6.5 5.0 - 8.0   Glucose, UA NEGATIVE NEGATIVE mg/dL   Hgb urine dipstick NEGATIVE NEGATIVE   Bilirubin Urine NEGATIVE NEGATIVE   Ketones, ur NEGATIVE NEGATIVE mg/dL   Protein, ur NEGATIVE NEGATIVE mg/dL   Urobilinogen, UA 0.2 0.0 - 1.0 mg/dL   Nitrite NEGATIVE NEGATIVE   Leukocytes, UA NEGATIVE NEGATIVE  I-Stat Troponin, ED (not at [facility])  Result Value Ref Range   Troponin i, poc 0.01 0.00 - 0.08 ng/mL   Comment 3           Dg Chest 2 View  03/09/2014   CLINICAL DATA:  Cough for several weeks.  EXAM: CHEST  2 VIEW  COMPARISON:  November 19, 2011.  FINDINGS: The heart size and mediastinal contours are within normal limits. No pneumothorax or pleural effusion is noted. Stable interstitial densities are noted throughout both lungs concerning for scarring. No acute pulmonary disease is noted. 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  IMPRESSION: No acute cardiopulmonary abnormality seen.   Electronically Signed   By: Marijo Conception, M.D.   On: 03/09/2014 12:55   Ct Head Wo Contrast  03/09/2014   CLINICAL DATA:  Head trauma after falling off of the toilet. Right forehead scalp hematoma.  EXAM: CT HEAD WITHOUT CONTRAST  CT CERVICAL SPINE WITHOUT CONTRAST  TECHNIQUE: Multidetector CT imaging of the head and cervical spine was performed following the standard protocol without intravenous contrast. Multiplanar CT image reconstructions of the cervical spine were also generated.  COMPARISON:  None.  FINDINGS: CT HEAD FINDINGS  No mass lesion. No midline shift. No acute hemorrhage or hematoma. No extra-axial fluid collections. No evidence of acute infarction. There is diffuse cerebral cortical atrophy with secondary ventricular dilatation. Periventricular white matter lucency consistent with chronic small vessel ischemic disease. Old small lacunar infarct in the right basal ganglia. Small scalp hematoma over the right frontal bone. Osseous structures are intact. Mucosal thickening and small amount of fluid in both maxillary sinuses, not felt to be significant.  CT CERVICAL SPINE FINDINGS  There is no fracture, subluxation, or prevertebral soft tissue swelling. Slight facet arthritis at C3-4 and C4-5 on the left and C3-4 on the right. Small broad-based disc bulge at C5-6. Congenital incomplete posterior arch of C1.  IMPRESSION: 1. No significant abnormality of the cervical spine. 2. No acute intracranial abnormality. 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 3. Right frontal scalp hematoma.   Electronically Signed   By: Lorriane Shire M.D.   On: 03/09/2014 12:35   Ct Cervical Spine Wo Contrast  03/09/2014   CLINICAL DATA:  Head trauma after falling off of  the toilet. Right forehead scalp hematoma.  EXAM: CT HEAD WITHOUT CONTRAST  CT CERVICAL  SPINE WITHOUT CONTRAST  TECHNIQUE: Multidetector CT imaging of the head and cervical spine was performed following the standard protocol without intravenous contrast. Multiplanar CT image reconstructions of the cervical spine were also generated.  COMPARISON:  None.  FINDINGS: CT HEAD FINDINGS  No mass lesion. No midline shift. No acute hemorrhage or hematoma. No extra-axial fluid collections. No evidence of acute infarction. There is diffuse cerebral cortical atrophy with secondary ventricular dilatation. Periventricular white matter lucency consistent with chronic small vessel ischemic disease. Old small lacunar infarct in the right basal ganglia. Small scalp hematoma over the right frontal bone. Osseous structures are intact. Mucosal thickening and small amount of fluid in both maxillary sinuses, not felt to be significant.  CT CERVICAL SPINE FINDINGS  There is no fracture, subluxation, or prevertebral soft tissue swelling. Slight facet arthritis at C3-4 and C4-5 on the left and C3-4 on the right. Small broad-based disc bulge at C5-6. Congenital incomplete posterior arch of C1.  IMPRESSION: 1. No significant abnormality of the cervical spine. 2. No acute intracranial abnormality. 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 3. Right frontal scalp hematoma.   Electronically Signed   By: Lorriane Shire M.D.   On: 03/09/2014 12:35      EKG Interpretation None     ED ECG REPORT  I personally interpreted this EKG   Date: 03/09/2014   Rate: 62  Rhythm: normal sinus rhythm  QRS Axis: left  Intervals: PR prolonged  ST/T Wave abnormalities: nonspecific T wave changes  Conduction Disutrbances:first-degree A-V block   Narrative Interpretation:  New inverted t-wave in V6  Old EKG Reviewed: changes noted   MDM   Final diagnoses:  Cough  Fall, initial encounter    Patient with mechanical fall. Imaging of the head and neck are negative. He  does have a right frontal scalp hematoma which was cleaned and dressed in the emergency department. Urinalysis is negative.  Patient seen by and discussed with Dr Tawnya Crook.  Patient will be discharged to [facility] where he will be moved to the skilled nursing section.    Montine Circle, PA-C 03/09/14 [place], MD 03/10/14 [phone]

## 2014-03-09 NOTE — Telephone Encounter (Signed)
I called the patient, left a message. I will call back later, apparent the patient has been having excessive drowsiness, falling asleep during the day.

## 2014-03-09 NOTE — ED Notes (Signed)
Per EMS [facility], pt was sitting on toilet then fell asleep and fell off of toilet, hit his head, awoke immediately. Per family, patient has been having episodes of falling asleep inappropriately, including episode yesterday where patient was standing at the sink when he fell asleep and fell down. Per EMS pt not on blood thinners.

## 2014-03-09 NOTE — ED Notes (Signed)
Patient transported to CT 

## 2014-03-09 NOTE — Discharge Instructions (Signed)

## 2014-03-09 NOTE — Telephone Encounter (Signed)
Patient's spouse stated patient has been very sleepy in am at breakfast and other times during the day.  This morning he was sitting on toilet and fell asleep and hit head.  Patient's at ER at [facility].  Please call cell # [phone] and advise.

## 2014-03-09 NOTE — Progress Notes (Signed)
Plan for pt to dc back to [facility] to [place]. Pt with signed fl2 and pasarr number.   Noreene Larsson [phone]  ED CSW 03/09/2014 4:37 PM

## 2014-03-09 NOTE — ED Notes (Signed)
Pt. Urinated on self so unable to catch a urine specimen at this time. Urinal at bedside at this time.

## 2014-03-10 ENCOUNTER — Telehealth: Payer: Self-pay | Admitting: Neurology

## 2014-03-10 ENCOUNTER — Telehealth: Payer: Self-pay | Admitting: *Deleted

## 2014-03-10 ENCOUNTER — Non-Acute Institutional Stay (SKILLED_NURSING_FACILITY): Payer: Medicare Other | Admitting: Nurse Practitioner

## 2014-03-10 DIAGNOSIS — K219 Gastro-esophageal reflux disease without esophagitis: Secondary | ICD-10-CM | POA: Diagnosis not present

## 2014-03-10 DIAGNOSIS — K5900 Constipation, unspecified: Secondary | ICD-10-CM

## 2014-03-10 DIAGNOSIS — R609 Edema, unspecified: Secondary | ICD-10-CM

## 2014-03-10 DIAGNOSIS — G20 Parkinson's disease: Secondary | ICD-10-CM | POA: Diagnosis not present

## 2014-03-10 DIAGNOSIS — Z9181 History of falling: Secondary | ICD-10-CM

## 2014-03-10 DIAGNOSIS — K5909 Other constipation: Secondary | ICD-10-CM

## 2014-03-10 DIAGNOSIS — G4719 Other hypersomnia: Secondary | ICD-10-CM

## 2014-03-10 NOTE — Assessment & Plan Note (Signed)
Chronic, takes Amitiza 23mcg bid, Colace 100mg  qhs

## 2014-03-10 NOTE — Progress Notes (Signed)
Patient ID: Brian Aguirre, male   DOB: January 20, 1929, 81 y.o.   MRN: [id number]   Code Status: DNR  Allergies  Allergen Reactions  . Myrbetriq [Mirabegron] Nausea And Vomiting  . Stalevo [Carbidopa-Levodopa-Entacapone] Diarrhea    Chief Complaint  Patient presents with  . Medical Management of Chronic Issues    HPI: Patient is a 81 y.o. male seen in the SNF at [facility] today for evaluation of chronic medical conditions.    03/09/14 ED eval for fall, CT head and C-spine are normal. UA does not appear infected, stable right frontal hematoma    12/12/13 ED eval for fell onto the left side. He has pain at the left hip with standing and ambulation. He has a history of frequent falls secondary to Parkinson's. CT left hip and X-ray left femur showed no acute osseous abnormality.      Problem List Items Addressed This Visit    Paralysis agitans - Primary    Frequent falling, poor balance, and generalized weakness. Takes Sinemet 25/100 tid and 25/250 qid and Mirapex 1.5mg  tid and Eldepryl. PT/OT to eval and treat as indicated for gait, balance, ADL re-training,  and strengthening. Goal is to return home independent living with in home aids.       GERD (gastroesophageal reflux disease)    Stable, takes Ranitidine 75mg  daily      Edema    Trace edema, takes Furosemide 20mg  daily. BNP 145.2 12/23/13        Chronic constipation    Chronic, takes Amitiza 72mcg bid, Colace 100mg  qhs          Review of Systems:  Review of Systems  Constitutional: Positive for malaise/fatigue. Negative for fever, chills, weight loss and diaphoresis.  HENT: Positive for hearing loss. Negative for congestion, ear discharge, ear pain, nosebleeds, sore throat and tinnitus.   Eyes: Negative for blurred vision, double vision, photophobia, pain, discharge and redness.  Respiratory: Negative for cough, hemoptysis, sputum production, shortness of breath, wheezing and stridor.   Cardiovascular: Positive  for leg swelling. Negative for chest pain, palpitations, orthopnea, claudication and PND.  Gastrointestinal: Negative for heartburn, nausea, vomiting, abdominal pain, diarrhea, constipation, blood in stool and melena.  Genitourinary: Positive for frequency. Negative for dysuria, urgency, hematuria and flank pain.  Musculoskeletal: Positive for joint pain and falls. Negative for myalgias, back pain and neck pain.  Skin: Negative for itching and rash.       Right frontal hematoma  Neurological: Negative for dizziness, tingling, tremors, sensory change, speech change, focal weakness, seizures, loss of consciousness, weakness and headaches.       Frequent falling, poor balance, generalized weakness.   Endo/Heme/Allergies: Negative for environmental allergies and polydipsia. Does not bruise/bleed easily.  Psychiatric/Behavioral: Positive for memory loss. Negative for depression, suicidal ideas, hallucinations and substance abuse. The patient is not nervous/anxious and does not have insomnia.      Past Medical History  Diagnosis Date  . Parkinson disease   . Osteoporosis   . Dizziness   . Depression   . Hypertension   . Gait disturbance   . Benign enlargement of prostate   . Orthostatic hypotension   . Fracture, rib   . Memory loss   . Chronic constipation 02/26/2014   Past Surgical History  Procedure Laterality Date  . Appendectomy    . Tonsillectomy    . Hernia repair    . Cataract extraction Left    Social History:   reports that he has never smoked.  He has never used smokeless tobacco. He reports that he does not drink alcohol or use illicit drugs.  Family History  Problem Relation Age of Onset  . Cancer Mother   . Heart failure Father     Medications: Patient's Medications  New Prescriptions   No medications on file  Previous Medications   ASPIRIN EC 81 MG TABLET    Take 81 mg by mouth at bedtime.   CARBIDOPA-LEVODOPA (SINEMET IR) 25-250 MG PER TABLET    Take 1 tablet  by mouth. Seven times daily @ Taken at 6 am , 9 am , 11:30 am, 2 pm, 4 pm, 6:30 pm, and Bedtime.   DOCUSATE SODIUM (COLACE) 100 MG CAPSULE    Take 200 mg by mouth at bedtime.    FISH OIL-OMEGA-3 FATTY ACIDS 1000 MG CAPSULE    Take 1 g by mouth daily with breakfast.    FUROSEMIDE (LASIX) 20 MG TABLET    Take 20 mg by mouth daily.    LUBIPROSTONE (AMITIZA) 24 MCG CAPSULE    Take 1 capsule (24 mcg total) by mouth 2 (two) times daily with a meal.   MULTIPLE VITAMIN (MULTIVITAMIN WITH MINERALS) TABS    Take 1 tablet by mouth every morning.    PRAMIPEXOLE (MIRAPEX) 1.5 MG TABLET    Take 1.5 mg by mouth 3 (three) times daily. Taken at 6 am , 2 pm and bedtime   PROBIOTIC PRODUCT (ALIGN) 4 MG CAPS    Take 1 capsule by mouth at bedtime.   RANITIDINE (ZANTAC) 75 MG TABLET    Take 75 mg by mouth daily as needed for heartburn (reflux).    SELEGILINE (ELDEPRYL) 5 MG TABLET    Take 5 mg by mouth 2 (two) times daily with a meal.   TRIAMCINOLONE (NASACORT AQ) 55 MCG/ACT AERO NASAL INHALER    Place 1 spray into the nose daily as needed (for allergies).   VITAMIN B-12 (CYANOCOBALAMIN) 500 MCG TABLET    Take 500 mcg by mouth every morning.    VITAMIN D, CHOLECALCIFEROL, 400 UNITS TABLET    Take 400 Units by mouth every morning.  Modified Medications   No medications on file  Discontinued Medications   No medications on file     Physical Exam: Physical Exam  Constitutional: He is oriented to person, place, and time. He appears well-developed and well-nourished. No distress.  HENT:  Head: Normocephalic and atraumatic.  Right Ear: External ear normal.  Left Ear: External ear normal.  Nose: Nose normal.  Mouth/Throat: Oropharynx is clear and moist. No oropharyngeal exudate.  Eyes: Conjunctivae and EOM are normal. Pupils are equal, round, and reactive to light. Right eye exhibits no discharge. Left eye exhibits no discharge. No scleral icterus.  Neck: Normal range of motion. Neck supple. No JVD present. No  tracheal deviation present. No thyromegaly present.  Cardiovascular: Normal rate, regular rhythm, normal heart sounds and intact distal pulses.   No murmur heard. Pulmonary/Chest: Effort normal and breath sounds normal. No stridor. No respiratory distress. He has no wheezes. He has no rales. He exhibits no tenderness.  Abdominal: Soft. Bowel sounds are normal. He exhibits no distension and no mass. There is no tenderness. There is no rebound and no guarding.  Musculoskeletal: Normal range of motion. He exhibits edema. He exhibits no tenderness.  Trace edema BLE  Lymphadenopathy:    He has no cervical adenopathy.  Neurological: He is alert and oriented to person, place, and time. He has normal reflexes. He exhibits  normal muscle tone. Coordination abnormal.  Mild masking facial looks.   Skin: Skin is warm and dry. No rash noted. He is not diaphoretic. No erythema. No pallor.  A hematoma right frontal area.    Psychiatric: He has a normal mood and affect. His behavior is normal. Judgment and thought content normal.    Filed Vitals:   03/10/14 1507  BP: 140/80  Pulse: 69  Temp: 97.2 F (36.2 C)  TempSrc: Tympanic  Resp: 16      Labs reviewed: Basic Metabolic Panel:  Recent Labs  03/20/13 1321 12/22/13 12/25/13 03/09/14 1113  NA 136 131* 134* 134*  K 4.1 3.8 4.1 3.8  CL 96*  --   --  99  CO2 26  --   --  28  GLUCOSE 84  --   --  99  BUN 22 22* 19 20  CREATININE 0.99 0.9 0.9 0.76  CALCIUM 8.9  --   --  8.8  TSH 1.680 1.81  --   --    Liver Function Tests:  Recent Labs  03/20/13 1321 12/22/13  AST 22 28  ALT 6 8*  ALKPHOS 111 107  BILITOT 0.5  --   PROT 6.5  --    No results for input(s): LIPASE, AMYLASE in the last 8760 hours. No results for input(s): AMMONIA in the last 8760 hours. CBC:  Recent Labs  03/20/13 1321 06/04/13 1348 12/22/13 03/09/14 1113  WBC 5.6 5.8 5.5 6.3  NEUTROABS 3.9 4.3  --  4.7  HGB 12.6 12.7 12.5* 12.1*  HCT 37.2* 37.5 37* 37.1*    MCV 92 94  --  91.4  PLT 195 202 272 223   Lipid Panel: No results for input(s): CHOL, HDL, LDLCALC, TRIG, CHOLHDL, LDLDIRECT in the last 8760 hours.  Past Procedures:  12/12/13 X-ray left femur:  IMPRESSION: Negative.  12/12/13 CT left hip:  IMPRESSION: No evidence of acute left hip fracture or dislocation.  03/09/14 CT head and cervical spine w/o contrast:  IMPRESSION: 1. No significant abnormality of the cervical spine. 2. No acute intracranial abnormality. 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 3. Right frontal scalp hematoma.  Assessment/Plan Paralysis agitans Frequent falling, poor balance, and generalized weakness. Takes Sinemet 25/100 tid and 25/250 qid and Mirapex 1.5mg  tid and Eldepryl. PT/OT to eval and treat as indicated for gait, balance, ADL re-training,  and strengthening. Goal is to return home independent living with in home aids.    Edema Trace edema, takes Furosemide 20mg  daily. BNP 145.2 12/23/13     Chronic constipation Chronic, takes Amitiza 53mcg bid, Colace 100mg  qhs    GERD (gastroesophageal reflux disease) Stable, takes Ranitidine 75mg  daily     Family/ Staff Communication: observe the patient.   Goals of Care: [place]  Labs/tests ordered: none

## 2014-03-10 NOTE — Telephone Encounter (Signed)
I called the patient, talk with the daughter. The patient remains groggy, out of it. I will try to work him in next Tuesday. I am not here in the office later this week.

## 2014-03-10 NOTE — Telephone Encounter (Signed)
I called the wife, the patient has had increasing problems with excessive daytime drowsiness, fell sleep on the toilet, and fell off and hit his head. He has a tendency to sleep on his back, he may have obstructive sleep apnea because of this. I have made a referral to the sleep study in February, we'll try to find out what is going on for this referral.

## 2014-03-10 NOTE — Assessment & Plan Note (Addendum)
Frequent falling, poor balance, and generalized weakness. Takes Sinemet 25/100 tid and 25/250 qid and Mirapex 1.5mg  tid and Eldepryl. PT/OT to eval and treat as indicated for gait, balance, ADL re-training,  and strengthening. Goal is to return home independent living with in home aids.

## 2014-03-10 NOTE — Telephone Encounter (Signed)
Dr. Margette Fast refers patient for attended sleep study.  Height: 5'8"  Weight: 160 lb 3.2 oz   BMI: 24.36  Past Medical History:  Parkinson disease    . Osteoporosis   . Dizziness   . Depression   . Hypertension   . Gait disturbance   . Benign enlargement of prostate   . Orthostatic hypotension   . Fracture, rib   . Memory loss   . Chronic constipation 02/26/2014           Sleep Symptoms: Fatigue, excessive daytime sleepiness, sleep talking, urinary urgency, nocturnal bruxism. I spoke to patients daughter and she states he was seen in the ED 03/09/14 patient fell off the toilet and hit his head due to falling asleep she states he has very severe sleepiness. He has been sent to a skilled nursing facility and she is working to have a home health nurse stay with him the night of his study.    Epworth Score: Unknown   Medication:  Aspirin (Tablet Delayed Response) aspirin EC 81 MG Take 81 mg by mouth at bedtime.       Carbidopa-Levodopa (Tab) SINEMET IR 25-250 MG Take 1 tablet by mouth. Seven times daily @ Taken at 6 am , 9 am , 11:30 am, 2 pm, 4 pm, 6:30 pm, and Bedtime.      Cholecalciferol (Tab) vitamin D (CHOLECALCIFEROL) 400 UNITS Take 400 Units by mouth every morning.      Cyanocobalamin (Tab) CYANOCOBALAMIN 500 MCG Take 500 mcg by mouth every morning.       Docusate Sodium (Cap) COLACE 100 MG Take 200 mg by mouth at bedtime.       Furosemide (Tab) LASIX 20 MG Take 20 mg by mouth daily.       Lubiprostone (Cap) AMITIZA 24 MCG Take 1 capsule (24 mcg total) by mouth 2 (two) times daily with a meal.      Multiple Vitamin (Tab) multivitamin with minerals  Take 1 tablet by mouth every morning.       Omega-3 Fatty Acids (Cap) fish oil-omega-3 fatty acids 1000 MG Take 1 g by mouth daily with breakfast.       Pramipexole Dihydrochloride (Tab) MIRAPEX 1.5 MG Take 1.5 mg by mouth 3 (three) times daily. Taken at 6 am , 2 pm  and bedtime      Probiotic Product (Cap) ALIGN 4 MG Take 1 capsule by mouth at bedtime.      Ranitidine HCl (Tab) ZANTAC 75 MG Take 75 mg by mouth daily as needed for heartburn (reflux).       Selegiline HCl (Tab) ELDEPRYL 5 MG Take 5 mg by mouth 2 (two) times daily with a meal.      Triamcinolone Acetonide (Aerosol) NASACORT 55 MCG/ACT Place 1 spray into the nose daily as needed (for allergies).       Ins: Medicare/BCBS Supplement   Assessment & Plan:  1. Parkinson's disease  2. Gait disorder  3. Chronic constipation  4. Orthostatic hypotension  5. Nocturnal bruxism  The patient's dentist has requested a sleep study to evaluate the nocturnal bruxism. The patient continues to have severe constipation. He will increase fluids, and fiber in the diet. The patient will be continued on his medications for Parkinson's disease. The patient has a sensation of dizziness with standing, but blood pressures today do not show orthostasis. He will follow-up in 4 months. He will be referred for a sleep study. He will do his speech exercises at home.  Please review patient information and submit instructions for scheduling and orders for sleep technologist. Thank you.

## 2014-03-10 NOTE — Telephone Encounter (Signed)
Patient fell yesterday and went to the hospital. The patient is still disoriented and groggy and the daughter wants him seen this week. Please call the daughter and advise.

## 2014-03-10 NOTE — Assessment & Plan Note (Signed)
Trace edema, takes Furosemide 20mg  daily. BNP 145.2 12/23/13

## 2014-03-10 NOTE — Assessment & Plan Note (Signed)
Stable, takes Ranitidine 75mg  daily

## 2014-03-11 DIAGNOSIS — R296 Repeated falls: Secondary | ICD-10-CM | POA: Diagnosis not present

## 2014-03-11 DIAGNOSIS — M6281 Muscle weakness (generalized): Secondary | ICD-10-CM | POA: Diagnosis not present

## 2014-03-11 NOTE — Telephone Encounter (Signed)
We are working on scheduling this patient.

## 2014-03-11 NOTE — Telephone Encounter (Signed)
parkinson's patient, I will ask Dr Rexene Alberts to review  this one. CD

## 2014-03-11 NOTE — Telephone Encounter (Signed)
Spoke to patient's daughter and he has been scheduled for 03-17-14 at 4pm.  She also wanted the doctor to know that the only medication the patient increased was Senna and they called the PCP who relayed all his electrolytes were fine. She just wanted doctor to know and he doesn't need to call.

## 2014-03-12 DIAGNOSIS — M6281 Muscle weakness (generalized): Secondary | ICD-10-CM | POA: Diagnosis not present

## 2014-03-12 DIAGNOSIS — R296 Repeated falls: Secondary | ICD-10-CM | POA: Diagnosis not present

## 2014-03-13 ENCOUNTER — Non-Acute Institutional Stay (SKILLED_NURSING_FACILITY): Payer: Medicare Other | Admitting: Internal Medicine

## 2014-03-13 ENCOUNTER — Encounter: Payer: Self-pay | Admitting: Internal Medicine

## 2014-03-13 ENCOUNTER — Telehealth: Payer: Self-pay | Admitting: Neurology

## 2014-03-13 DIAGNOSIS — I951 Orthostatic hypotension: Secondary | ICD-10-CM

## 2014-03-13 DIAGNOSIS — Z9181 History of falling: Secondary | ICD-10-CM | POA: Diagnosis not present

## 2014-03-13 DIAGNOSIS — R269 Unspecified abnormalities of gait and mobility: Secondary | ICD-10-CM | POA: Diagnosis not present

## 2014-03-13 DIAGNOSIS — M6281 Muscle weakness (generalized): Secondary | ICD-10-CM | POA: Diagnosis not present

## 2014-03-13 DIAGNOSIS — S0083XA Contusion of other part of head, initial encounter: Secondary | ICD-10-CM

## 2014-03-13 DIAGNOSIS — G20 Parkinson's disease: Secondary | ICD-10-CM | POA: Diagnosis not present

## 2014-03-13 DIAGNOSIS — R296 Repeated falls: Secondary | ICD-10-CM | POA: Diagnosis not present

## 2014-03-13 DIAGNOSIS — M25552 Pain in left hip: Secondary | ICD-10-CM

## 2014-03-13 NOTE — Telephone Encounter (Signed)
Spoke to daughter and patient's appointment is changed to Tuesday at [facility].

## 2014-03-13 NOTE — Telephone Encounter (Addendum)
Daughter is calling stating that you made an appointment for her father on Tuesday 03/17/14 @ 4:00.  I explained to her that the appointment was made for Monday 03/16/14.  She would like for you to give her a call and she has another question for you as well that she did not share with me.  Please call [phone] and advise.

## 2014-03-13 NOTE — Progress Notes (Signed)
Patient ID: Brian Aguirre, male   DOB: 12-25-1929, 81 y.o.   MRN: [id number]    HISTORY AND PHYSICAL  Location:  [facility] Room Number: 18 Place of Service: SNF (31)   Extended Emergency Contact Information Primary Emergency Contact: Matus,Norma Address: [address]          [place], [place] [address] [place] Phone: [phone] Relation: Spouse  Advanced Directive information Does patient have an advance directive?: Yes, Would patient like information on creating an advanced directive?: No - patient declined information, Type of Advance Directive: Healthcare Power of [place];Living will, Does patient want to make changes to advanced directive?: No - Patient declined  Chief Complaint  Patient presents with  . New Admit To SNF    Following a trip to the emergency room 03/09/2014 because of a fall and head contusion when he fell asleep while sitting on the toilet    HPI:  Patient was seen in the emergency room 03/09/2014 due to falling asleep while sitting on the toilet. He had a 4 x 4 centimeter contusion of the right forehead. His wife says that he is falling asleep at inappropriate times. He is very unstable on his feet and tells me today that he has had 5 falls in the last couple of months.  Patient has Parkinsonism and is on multiple drug therapy including Sinemet, Mirapex, and Eldepryl. He does not think that his drugs are making him sleepy.   Review of medications does not turn up anything that I would think is highly likely to create to drowsiness. He has a sleep study scheduled by [name] in the near future.   During his stay in the SNF, he has benefitted from PT. He is walking in the halls independently, but remains a fall risk.  He feels ready to return to his apt with his wife in independent living on 03/16/14.  Past Medical History  Diagnosis Date  . Parkinson disease   . Osteoporosis   . Dizziness   .  Depression   . Hypertension   . Gait disturbance   . Benign enlargement of prostate   . Orthostatic hypotension   . Fracture, rib   . Memory loss   . Chronic constipation 02/26/2014  . History of fall 03/09/2014  . Contusion of forehead 03/09/2014    Past Surgical History  Procedure Laterality Date  . Appendectomy    . Tonsillectomy    . Hernia repair    . Cataract extraction Left     Patient Care Team: Lajean Manes, MD as PCP - General (Internal Medicine) Malka So, MD as Attending Physician (Urology) Charlotte Crumb, MD as Consulting Physician (Orthopedic Surgery)  History   Social History  . Marital Status: Married    Spouse Name: N/A  . Number of Children: 2  . Years of Education: College +   Occupational History  . Retired Theme park manager    Social History Main Topics  . Smoking status: Never Smoker   . Smokeless tobacco: Never Used  . Alcohol Use: No  . Drug Use: No  . Sexual Activity: Not on file   Other Topics Concern  . Not on file   Social History Narrative   Patient is right handed.   Patient drinks 1/2 cup caffeine daily.     reports that he has never smoked. He has never used smokeless tobacco. He reports that he does not drink alcohol or use illicit  drugs.  Family History  Problem Relation Age of Onset  . Cancer Mother   . Heart failure Father    Family Status  Relation Status Death Age  . Mother Deceased 33  . Father Deceased 58  . Brother Deceased     Motor vehicle accident     There is no immunization history on file for this patient.  Allergies  Allergen Reactions  . Myrbetriq [Mirabegron] Nausea And Vomiting  . Stalevo [Carbidopa-Levodopa-Entacapone] Diarrhea    Medications: Patient's Medications  New Prescriptions   No medications on file  Previous Medications   ASPIRIN EC 81 MG TABLET    Take 81 mg by mouth at bedtime.   CARBIDOPA-LEVODOPA (SINEMET IR) 25-250 MG PER TABLET    Take 1 tablet by mouth. Seven times daily @  Taken at 6 am , 9 am , 11:30 am, 2 pm, 4 pm, 6:30 pm, and Bedtime.   DOCUSATE SODIUM (COLACE) 100 MG CAPSULE    Take 200 mg by mouth at bedtime.    FISH OIL-OMEGA-3 FATTY ACIDS 1000 MG CAPSULE    Take 1 g by mouth daily with breakfast.    FUROSEMIDE (LASIX) 20 MG TABLET    Take 20 mg by mouth daily.    LUBIPROSTONE (AMITIZA) 24 MCG CAPSULE    Take 1 capsule (24 mcg total) by mouth 2 (two) times daily with a meal.   MULTIPLE VITAMIN (MULTIVITAMIN WITH MINERALS) TABS    Take 1 tablet by mouth every morning.    PRAMIPEXOLE (MIRAPEX) 1.5 MG TABLET    Take 1.5 mg by mouth 3 (three) times daily. Taken at 6 am , 2 pm and bedtime   PROBIOTIC PRODUCT (ALIGN) 4 MG CAPS    Take 1 capsule by mouth at bedtime.   RANITIDINE (ZANTAC) 75 MG TABLET    Take 75 mg by mouth daily as needed for heartburn (reflux).    SELEGILINE (ELDEPRYL) 5 MG TABLET    Take 5 mg by mouth 2 (two) times daily with a meal.   TRIAMCINOLONE (NASACORT AQ) 55 MCG/ACT AERO NASAL INHALER    Place 1 spray into the nose daily as needed (for allergies).   VITAMIN B-12 (CYANOCOBALAMIN) 500 MCG TABLET    Take 500 mcg by mouth every morning.    VITAMIN D, CHOLECALCIFEROL, 400 UNITS TABLET    Take 400 Units by mouth every morning.  Modified Medications   No medications on file  Discontinued Medications   No medications on file    Review of Systems  Constitutional: Negative for fever, chills and diaphoresis.  HENT: Positive for hearing loss. Negative for congestion, ear discharge, ear pain, nosebleeds, sore throat and tinnitus.   Eyes: Negative for photophobia, pain, discharge and redness.  Respiratory: Negative for cough, shortness of breath, wheezing and stridor.   Cardiovascular: Positive for leg swelling. Negative for chest pain and palpitations.  Gastrointestinal: Negative for nausea, vomiting, abdominal pain, diarrhea, constipation and blood in stool.  Endocrine: Negative for polydipsia.  Genitourinary: Positive for frequency. Negative  for dysuria, urgency, hematuria and flank pain.  Musculoskeletal: Positive for myalgias, back pain and gait problem. Negative for neck pain.  Skin: Negative for rash.       Right frontal abrasion.  Allergic/Immunologic: Negative for environmental allergies.  Neurological: Negative for dizziness, tremors, seizures, weakness and headaches.       Frequent falling, poor balance, generalized weakness. Memory loss.  Hematological: Does not bruise/bleed easily.  Psychiatric/Behavioral: Negative for suicidal ideas and hallucinations. The patient  is not nervous/anxious.     Filed Vitals:   03/13/14 1630  BP: 110/64  Pulse: 66  Temp: 97.5 F (36.4 C)  Resp: 18  Height: '5\' 8"'  (1.727 m)  Weight: 160 lb (72.576 kg)   Body mass index is 24.33 kg/(m^2).  Physical Exam  Constitutional: He is oriented to person, place, and time. He appears well-developed and well-nourished. No distress.  HENT:  Head: Normocephalic and atraumatic.  Right Ear: External ear normal.  Left Ear: External ear normal.  Nose: Nose normal.  Mouth/Throat: Oropharynx is clear and moist. No oropharyngeal exudate.  Eyes: Conjunctivae and EOM are normal. Pupils are equal, round, and reactive to light. Right eye exhibits no discharge. Left eye exhibits no discharge. No scleral icterus.  Neck: Normal range of motion. Neck supple. No JVD present. No tracheal deviation present. No thyromegaly present.  Cardiovascular: Normal rate, regular rhythm, normal heart sounds and intact distal pulses.   No murmur heard. Pulmonary/Chest: Effort normal and breath sounds normal. No stridor. No respiratory distress. He has no wheezes. He has no rales. He exhibits no tenderness.  Abdominal: Soft. Bowel sounds are normal. He exhibits no distension and no mass. There is no tenderness. There is no rebound and no guarding.  Musculoskeletal: Normal range of motion. He exhibits edema. He exhibits no tenderness.  Trace edema BLE  Lymphadenopathy:     He has no cervical adenopathy.  Neurological: He is alert and oriented to person, place, and time. He has normal reflexes. He exhibits normal muscle tone. Coordination abnormal.  Mild masking facial looks. Paucity of movement. Increased rigidity.  Skin: Skin is warm and dry. No rash noted. He is not diaphoretic. No erythema. No pallor.  Abrasion right frontal area.    Psychiatric: He has a normal mood and affect. His behavior is normal. Judgment and thought content normal.     Labs reviewed: Admission on 03/09/2014, Discharged on 03/09/2014  Component Date Value Ref Range Status  . WBC 03/09/2014 6.3  4.0 - 10.5 K/uL Final  . RBC 03/09/2014 4.06* 4.22 - 5.81 MIL/uL Final  . Hemoglobin 03/09/2014 12.1* 13.0 - 17.0 g/dL Final  . HCT 03/09/2014 37.1* 39.0 - 52.0 % Final  . MCV 03/09/2014 91.4  78.0 - 100.0 fL Final  . MCH 03/09/2014 29.8  26.0 - 34.0 pg Final  . MCHC 03/09/2014 32.6  30.0 - 36.0 g/dL Final  . RDW 03/09/2014 13.9  11.5 - 15.5 % Final  . Platelets 03/09/2014 223  150 - 400 K/uL Final  . Neutrophils Relative % 03/09/2014 73  43 - 77 % Final  . Neutro Abs 03/09/2014 4.7  1.7 - 7.7 K/uL Final  . Lymphocytes Relative 03/09/2014 13  12 - 46 % Final  . Lymphs Abs 03/09/2014 0.8  0.7 - 4.0 K/uL Final  . Monocytes Relative 03/09/2014 9  3 - 12 % Final  . Monocytes Absolute 03/09/2014 0.5  0.1 - 1.0 K/uL Final  . Eosinophils Relative 03/09/2014 4  0 - 5 % Final  . Eosinophils Absolute 03/09/2014 0.3  0.0 - 0.7 K/uL Final  . Basophils Relative 03/09/2014 1  0 - 1 % Final  . Basophils Absolute 03/09/2014 0.0  0.0 - 0.1 K/uL Final  . Sodium 03/09/2014 134* 135 - 145 mmol/L Final  . Potassium 03/09/2014 3.8  3.5 - 5.1 mmol/L Final  . Chloride 03/09/2014 99  96 - 112 mmol/L Final  . CO2 03/09/2014 28  19 - 32 mmol/L Final  . Glucose,  Bld 03/09/2014 99  70 - 99 mg/dL Final  . BUN 03/09/2014 20  6 - 23 mg/dL Final  . Creatinine, Ser 03/09/2014 0.76  0.50 - 1.35 mg/dL Final  .  Calcium 03/09/2014 8.8  8.4 - 10.5 mg/dL Final  . GFR calc non Af Amer 03/09/2014 83* >90 mL/min Final  . GFR calc Af Amer 03/09/2014 >90  >90 mL/min Final   Comment: (NOTE) The eGFR has been calculated using the CKD EPI equation. This calculation has not been validated in all clinical situations. eGFR's persistently <90 mL/min signify possible Chronic Kidney Disease.   . Anion gap 03/09/2014 7  5 - 15 Final  . Troponin i, poc 03/09/2014 0.01  0.00 - 0.08 ng/mL Final  . Comment 3 03/09/2014          Final   Comment: Due to the release kinetics of cTnI, a negative result within the first hours of the onset of symptoms does not rule out myocardial infarction with certainty. If myocardial infarction is still suspected, repeat the test at appropriate intervals.   . Color, Urine 03/09/2014 YELLOW  YELLOW Final  . APPearance 03/09/2014 CLEAR  CLEAR Final  . Specific Gravity, Urine 03/09/2014 1.010  1.005 - 1.030 Final  . pH 03/09/2014 6.5  5.0 - 8.0 Final  . Glucose, UA 03/09/2014 NEGATIVE  NEGATIVE mg/dL Final  . Hgb urine dipstick 03/09/2014 NEGATIVE  NEGATIVE Final  . Bilirubin Urine 03/09/2014 NEGATIVE  NEGATIVE Final  . Ketones, ur 03/09/2014 NEGATIVE  NEGATIVE mg/dL Final  . Protein, ur 03/09/2014 NEGATIVE  NEGATIVE mg/dL Final  . Urobilinogen, UA 03/09/2014 0.2  0.0 - 1.0 mg/dL Final  . Nitrite 03/09/2014 NEGATIVE  NEGATIVE Final  . Leukocytes, UA 03/09/2014 NEGATIVE  NEGATIVE Final   MICROSCOPIC NOT DONE ON URINES WITH NEGATIVE PROTEIN, BLOOD, LEUKOCYTES, NITRITE, OR GLUCOSE <1000 mg/dL.  Nursing Home on 12/30/2013  Component Date Value Ref Range Status  . Glucose 12/25/2013 84   Final  . BUN 12/25/2013 19  4 - 21 mg/dL Final  . Creatinine 12/25/2013 0.9  0.6 - 1.3 mg/dL Final  . Potassium 12/25/2013 4.1  3.4 - 5.3 mmol/L Final  . Sodium 12/25/2013 134* 137 - 147 mmol/L Final  Nursing Home on 12/19/2013  Component Date Value Ref Range Status  . Hemoglobin 12/22/2013 12.5*  13.5 - 17.5 g/dL Final  . HCT 12/22/2013 37* 41 - 53 % Final  . Platelets 12/22/2013 272  150 - 399 K/L Final  . WBC 12/22/2013 5.5   Final  . Glucose 12/22/2013 82   Final  . BUN 12/22/2013 22* 4 - 21 mg/dL Final  . Creatinine 12/22/2013 0.9  0.6 - 1.3 mg/dL Final  . Potassium 12/22/2013 3.8  3.4 - 5.3 mmol/L Final  . Sodium 12/22/2013 131* 137 - 147 mmol/L Final  . Alkaline Phosphatase 12/22/2013 107  25 - 125 U/L Final  . ALT 12/22/2013 8* 10 - 40 U/L Final  . AST 12/22/2013 28  14 - 40 U/L Final  . Bilirubin, Total 12/22/2013 0.7   Final  . TSH 12/22/2013 1.81  0.41 - 5.90 uIU/mL Final    Dg Chest 2 View  03/09/2014   CLINICAL DATA:  Cough for several weeks.  EXAM: CHEST  2 VIEW  COMPARISON:  November 19, 2011.  FINDINGS: The heart size and mediastinal contours are within normal limits. No pneumothorax or pleural effusion is noted. Stable interstitial densities are noted throughout both lungs concerning for scarring. No acute pulmonary disease  is noted. 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  IMPRESSION: No acute cardiopulmonary abnormality seen.   Electronically Signed   By: Marijo Conception, M.D.   On: 03/09/2014 12:55   Ct Head Wo Contrast  03/09/2014   CLINICAL DATA:  Head trauma after falling off of the toilet. Right forehead scalp hematoma.  EXAM: CT HEAD WITHOUT CONTRAST  CT CERVICAL SPINE WITHOUT CONTRAST  TECHNIQUE: Multidetector CT imaging of the head and cervical spine was performed following the standard protocol without intravenous contrast. Multiplanar CT image reconstructions of the cervical spine were also generated.  COMPARISON:  None.  FINDINGS: CT HEAD FINDINGS  No mass lesion. No midline shift. No acute hemorrhage or hematoma. No extra-axial fluid collections. No evidence of acute infarction. There is diffuse cerebral cortical atrophy with secondary ventricular dilatation. Periventricular white matter lucency consistent with chronic  small vessel ischemic disease. Old small lacunar infarct in the right basal ganglia. Small scalp hematoma over the right frontal bone. Osseous structures are intact. Mucosal thickening and small amount of fluid in both maxillary sinuses, not felt to be significant.  CT CERVICAL SPINE FINDINGS  There is no fracture, subluxation, or prevertebral soft tissue swelling. Slight facet arthritis at C3-4 and C4-5 on the left and C3-4 on the right. Small broad-based disc bulge at C5-6. Congenital incomplete posterior arch of C1.  IMPRESSION: 1. No significant abnormality of the cervical spine. 2. No acute intracranial abnormality. 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 3. Right frontal scalp hematoma.   Electronically Signed   By: Lorriane Shire M.D.   On: 03/09/2014 12:35   Ct Cervical Spine Wo Contrast  03/09/2014   CLINICAL DATA:  Head trauma after falling off of the toilet. Right forehead scalp hematoma.  EXAM: CT HEAD WITHOUT CONTRAST  CT CERVICAL SPINE WITHOUT CONTRAST  TECHNIQUE: Multidetector CT imaging of the head and cervical spine was performed following the standard protocol without intravenous contrast. Multiplanar CT image reconstructions of the cervical spine were also generated.  COMPARISON:  None.  FINDINGS: CT HEAD FINDINGS  No mass lesion. No midline shift. No acute hemorrhage or hematoma. No extra-axial fluid collections. No evidence of acute infarction. There is diffuse cerebral cortical atrophy with secondary ventricular dilatation. Periventricular white matter lucency consistent with chronic small vessel ischemic disease. Old small lacunar infarct in the right basal ganglia. Small scalp hematoma over the right frontal bone. Osseous structures are intact. Mucosal thickening and small amount of fluid in both maxillary sinuses, not felt to be significant.  CT CERVICAL SPINE FINDINGS  There is no fracture, subluxation, or prevertebral soft  tissue swelling. Slight facet arthritis at C3-4 and C4-5 on the left and C3-4 on the right. Small broad-based disc bulge at C5-6. Congenital incomplete posterior arch of C1.  IMPRESSION: 1. No significant abnormality of the cervical spine. 2. No acute intracranial abnormality. 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 3. Right frontal scalp hematoma.   Electronically Signed   By: Lorriane Shire M.D.   On: 03/09/2014 12:35     Assessment/Plan  1. History of fall  2. Contusion of forehead, initial encounter Resolving  3. Left hip pain Mild. Does not seem to affect gait.  4. Orthostatic hypotension Chronic history. May relate to falls.  5. Paralysis agitans Moderately advanced Parkinson's disease on multiple medications.  6. Abnormality of gait Mainly related to loss  of balance and Parkinson's disease.   Patient is scheduled to be screened for sleep disorder with a sleep study arranged by [name].

## 2014-03-16 ENCOUNTER — Ambulatory Visit: Payer: Self-pay | Admitting: Neurology

## 2014-03-16 DIAGNOSIS — R296 Repeated falls: Secondary | ICD-10-CM | POA: Diagnosis not present

## 2014-03-16 DIAGNOSIS — H532 Diplopia: Secondary | ICD-10-CM | POA: Diagnosis not present

## 2014-03-16 DIAGNOSIS — M6281 Muscle weakness (generalized): Secondary | ICD-10-CM | POA: Diagnosis not present

## 2014-03-16 DIAGNOSIS — M810 Age-related osteoporosis without current pathological fracture: Secondary | ICD-10-CM | POA: Diagnosis not present

## 2014-03-16 DIAGNOSIS — J309 Allergic rhinitis, unspecified: Secondary | ICD-10-CM | POA: Diagnosis not present

## 2014-03-16 DIAGNOSIS — R42 Dizziness and giddiness: Secondary | ICD-10-CM | POA: Diagnosis not present

## 2014-03-16 NOTE — Telephone Encounter (Signed)
Dr. Rexene Alberts I see the note from Dr. Brett Fairy requesting you for this patient could you take a look at the sleep review and advise me on sleep order. I have been in contact with his daughter and he will have a nurse here with him the night of study which we are looking at him being here on March 14. Thanks

## 2014-03-16 NOTE — Telephone Encounter (Signed)
In House Sleep study request review: This patient has an underlying medical history of Parkinson's disease and is referred by [name] for an attended sleep study due to a report of excessive daytime somnolence and bruxism. I reviewed his records and it is secondary to his mobility limitations probably best to proceed with a home sleep test, unless someone can stay with him all night for assistance. Please note, if he is at fall risk than it is probably best to keep him in his own environment. I will go ahead and order a split-night sleep study in house but if there is a history of recurrent falls and he requires a hospital bed and we cannot accommodate him unfortunately. It is probably safest in that situation to have him have a home sleep test. Please discuss with patient's daughter.  Sleep Acquisition Technologist instructions: Please score at 4% and split if 2 hour estimated AHI >15/h, unless mandated otherwise by the insurance carrier.    Star Age, MD, PhD [facility])

## 2014-03-17 ENCOUNTER — Ambulatory Visit (INDEPENDENT_AMBULATORY_CARE_PROVIDER_SITE_OTHER): Payer: Medicare Other | Admitting: Neurology

## 2014-03-17 ENCOUNTER — Encounter: Payer: Self-pay | Admitting: Neurology

## 2014-03-17 VITALS — BP 133/63 | HR 65

## 2014-03-17 DIAGNOSIS — R269 Unspecified abnormalities of gait and mobility: Secondary | ICD-10-CM

## 2014-03-17 DIAGNOSIS — G20 Parkinson's disease: Secondary | ICD-10-CM | POA: Diagnosis not present

## 2014-03-17 DIAGNOSIS — I951 Orthostatic hypotension: Secondary | ICD-10-CM

## 2014-03-17 NOTE — Progress Notes (Signed)
Reason for visit: Parkinson's disease  Brian Aguirre is an 81 y.o. male  History of present illness:  Brian Aguirre is an 81 year old right-handed white male with a history of Parkinson's disease associated with a gait disorder. The patient has had episodes of hypotension that are generally noted in the morning time. He may be running blood pressures in the 70 systolic range. The patient has had episodes of falling since last seen, he has had 3 separate falls, 2 while standing, one while on the toilet. The patient struck his head when he fell off of the toilet, and he had to go to the emergency room for an evaluation. CT scan of the brain was unremarkable. The patient has been on Lasix for peripheral edema. He has had increasing daytime sleepiness. There is a concern whether or not the patient is having a fainting episode or falling asleep prior to the falls. The episodes generally will occur in the morning time, not later in the afternoon. Blood pressures are much better in the afternoon, generally running around 245 systolic. He returns for further evaluation.  Past Medical History  Diagnosis Date  . Parkinson disease   . Osteoporosis   . Dizziness   . Depression   . Hypertension   . Gait disturbance   . Benign enlargement of prostate   . Orthostatic hypotension   . Fracture, rib   . Memory loss   . Chronic constipation 02/26/2014  . History of fall 03/09/2014  . Contusion of forehead 03/09/2014    Past Surgical History  Procedure Laterality Date  . Appendectomy    . Tonsillectomy    . Hernia repair    . Cataract extraction Left     Family History  Problem Relation Age of Onset  . Cancer Mother   . Heart failure Father     Social history:  reports that he has never smoked. He has never used smokeless tobacco. He reports that he does not drink alcohol or use illicit drugs.    Allergies  Allergen Reactions  . Myrbetriq [Mirabegron] Nausea And Vomiting  . Stalevo  [Carbidopa-Levodopa-Entacapone] Diarrhea    Medications:  Prior to Admission medications   Medication Sig Start Date End Date Taking? Authorizing Provider  aspirin EC 81 MG tablet Take 81 mg by mouth at bedtime.   Yes Historical Provider, MD  carbidopa-levodopa (SINEMET IR) 25-100 MG per tablet Take by mouth 3 (three) times daily. 9am, 2pm,6:30pm. 03/10/14  Yes Historical Provider, MD  carbidopa-levodopa (SINEMET IR) 25-250 MG per tablet Take 1 tablet by mouth. Four times a day at 6am,1130am, 4pm, and bedtime 9:30pm.   Yes Historical Provider, MD  docusate sodium (COLACE) 100 MG capsule Take 200 mg by mouth at bedtime.    Yes Historical Provider, MD  fish oil-omega-3 fatty acids 1000 MG capsule Take 1 g by mouth daily with breakfast.    Yes Historical Provider, MD  furosemide (LASIX) 20 MG tablet Take 20 mg by mouth daily.  10/30/13  Yes Historical Provider, MD  lubiprostone (AMITIZA) 24 MCG capsule Take 1 capsule (24 mcg total) by mouth 2 (two) times daily with a meal. 02/20/14  Yes Kathrynn Ducking, MD  Multiple Vitamin (MULTIVITAMIN WITH MINERALS) TABS Take 1 tablet by mouth every morning.    Yes Historical Provider, MD  pramipexole (MIRAPEX) 1.5 MG tablet Take 1.5 mg by mouth 3 (three) times daily. Taken at 6 am , 2 pm and bedtime   Yes Historical Provider, MD  Probiotic Product (ALIGN) 4 MG CAPS Take 1 capsule by mouth at bedtime.   Yes Historical Provider, MD  ranitidine (ZANTAC) 75 MG tablet Take 75 mg by mouth daily as needed for heartburn (reflux).    Yes Historical Provider, MD  selegiline (ELDEPRYL) 5 MG tablet Take 5 mg by mouth 2 (two) times daily with a meal.   Yes Historical Provider, MD  triamcinolone (NASACORT AQ) 55 MCG/ACT AERO nasal inhaler Place 1 spray into the nose daily as needed (for allergies).   Yes Historical Provider, MD  vitamin B-12 (CYANOCOBALAMIN) 500 MCG tablet Take 500 mcg by mouth every morning.    Yes Historical Provider, MD  vitamin D, CHOLECALCIFEROL, 400 UNITS  tablet Take 400 Units by mouth every morning.   Yes Historical Provider, MD    ROS:  Out of a complete 14 system review of symptoms, the patient complains only of the following symptoms, and all other reviewed systems are negative.  Fatigue Drooling Double vision, blurred vision Cough Leg swelling Constipation Daytime sleepiness, snoring, acting out dreams Urgency of the bladder Back pain, left hip pain Bruising easily Dizziness, speech difficulty, weakness  Blood pressure 133/63, pulse 65.  Physical Exam  General: The patient is alert and cooperative at the time of the examination.  Skin: 1-2+ edema is noted below the knees bilaterally, slightly more prominent on the left.   Neurologic Exam  Mental status: The patient is oriented x 3.  Cranial nerves: Facial symmetry is present. Speech is normal, no aphasia or dysarthria is noted. Extraocular movements are full. Visual fields are full. Masking of the face is seen. The patient will drool on occasion.  Motor: The patient has good strength in all 4 extremities.  Sensory examination: Soft touch sensation is symmetric on the face, arms, and legs.  Coordination: The patient has good finger-nose-finger and heel-to-shin bilaterally.  Gait and station: The patient requires assistance with standing. Once up, he is able to ambulate with a walker with short shuffling steps, he has slow deliberate turns. When he first stands up, he has a tendency to lean backwards.  Reflexes: Deep tendon reflexes are symmetric.   Assessment/Plan:  1. Parkinson's disease  2. Gait disorder  3. Orthostatic hypotension  4. Excessive daytime drowsiness  The patient has continued to have issues with balance. He is having episodes of either fainting or falling asleep. The episodes may look the same. Blood pressures are well-maintained in the afternoon, but they are low in the morning. The patient will stop the Lasix to see if this improves the  blood pressures in the morning. We may consider cutting back the Mirapex dose to 1 mg 3 times a day in the future. If needed, midodrine can be added at 2.5 mg in the morning to help the blood pressures at this time. The patient has an appointment in June 2016, he will follow-up at that time. The patient has been set up for sleep study. A prescription was given for a shower chair. The Amitiza is felt to be the source of the lower extremity swelling.  Jill Alexanders MD 03/17/2014 7:44 PM  [facility] [address] [place], [place] [address]  Phone [phone] Fax [phone]

## 2014-03-17 NOTE — Patient Instructions (Signed)

## 2014-03-18 DIAGNOSIS — R296 Repeated falls: Secondary | ICD-10-CM | POA: Diagnosis not present

## 2014-03-18 DIAGNOSIS — M6281 Muscle weakness (generalized): Secondary | ICD-10-CM | POA: Diagnosis not present

## 2014-03-18 DIAGNOSIS — J309 Allergic rhinitis, unspecified: Secondary | ICD-10-CM | POA: Diagnosis not present

## 2014-03-18 DIAGNOSIS — H532 Diplopia: Secondary | ICD-10-CM | POA: Diagnosis not present

## 2014-03-18 DIAGNOSIS — R42 Dizziness and giddiness: Secondary | ICD-10-CM | POA: Diagnosis not present

## 2014-03-18 DIAGNOSIS — M810 Age-related osteoporosis without current pathological fracture: Secondary | ICD-10-CM | POA: Diagnosis not present

## 2014-03-19 DIAGNOSIS — H532 Diplopia: Secondary | ICD-10-CM | POA: Diagnosis not present

## 2014-03-19 DIAGNOSIS — M6281 Muscle weakness (generalized): Secondary | ICD-10-CM | POA: Diagnosis not present

## 2014-03-19 DIAGNOSIS — J309 Allergic rhinitis, unspecified: Secondary | ICD-10-CM | POA: Diagnosis not present

## 2014-03-19 DIAGNOSIS — M810 Age-related osteoporosis without current pathological fracture: Secondary | ICD-10-CM | POA: Diagnosis not present

## 2014-03-19 DIAGNOSIS — R296 Repeated falls: Secondary | ICD-10-CM | POA: Diagnosis not present

## 2014-03-19 DIAGNOSIS — R42 Dizziness and giddiness: Secondary | ICD-10-CM | POA: Diagnosis not present

## 2014-03-20 DIAGNOSIS — M810 Age-related osteoporosis without current pathological fracture: Secondary | ICD-10-CM | POA: Diagnosis not present

## 2014-03-20 DIAGNOSIS — R296 Repeated falls: Secondary | ICD-10-CM | POA: Diagnosis not present

## 2014-03-20 DIAGNOSIS — J309 Allergic rhinitis, unspecified: Secondary | ICD-10-CM | POA: Diagnosis not present

## 2014-03-20 DIAGNOSIS — M6281 Muscle weakness (generalized): Secondary | ICD-10-CM | POA: Diagnosis not present

## 2014-03-20 DIAGNOSIS — R42 Dizziness and giddiness: Secondary | ICD-10-CM | POA: Diagnosis not present

## 2014-03-20 DIAGNOSIS — H532 Diplopia: Secondary | ICD-10-CM | POA: Diagnosis not present

## 2014-03-23 ENCOUNTER — Observation Stay (HOSPITAL_COMMUNITY)
Admission: EM | Admit: 2014-03-23 | Discharge: 2014-03-25 | Disposition: A | Discharge status: Home / Self Care | Payer: Medicare Other | Attending: Internal Medicine | Admitting: Internal Medicine

## 2014-03-23 ENCOUNTER — Emergency Department (HOSPITAL_COMMUNITY): Payer: Medicare Other

## 2014-03-23 ENCOUNTER — Other Ambulatory Visit (HOSPITAL_COMMUNITY): Payer: Self-pay

## 2014-03-23 ENCOUNTER — Encounter (HOSPITAL_COMMUNITY): Payer: Self-pay

## 2014-03-23 DIAGNOSIS — F329 Major depressive disorder, single episode, unspecified: Secondary | ICD-10-CM | POA: Diagnosis not present

## 2014-03-23 DIAGNOSIS — I4581 Long QT syndrome: Secondary | ICD-10-CM | POA: Diagnosis not present

## 2014-03-23 DIAGNOSIS — M810 Age-related osteoporosis without current pathological fracture: Secondary | ICD-10-CM | POA: Insufficient documentation

## 2014-03-23 DIAGNOSIS — Z888 Allergy status to other drugs, medicaments and biological substances status: Secondary | ICD-10-CM | POA: Diagnosis not present

## 2014-03-23 DIAGNOSIS — F458 Other somatoform disorders: Principal | ICD-10-CM | POA: Insufficient documentation

## 2014-03-23 DIAGNOSIS — G20A1 Parkinson's disease without dyskinesia, without mention of fluctuations: Secondary | ICD-10-CM | POA: Diagnosis present

## 2014-03-23 DIAGNOSIS — G20 Parkinson's disease: Secondary | ICD-10-CM

## 2014-03-23 DIAGNOSIS — G909 Disorder of the autonomic nervous system, unspecified: Secondary | ICD-10-CM | POA: Diagnosis present

## 2014-03-23 DIAGNOSIS — I951 Orthostatic hypotension: Secondary | ICD-10-CM

## 2014-03-23 DIAGNOSIS — K219 Gastro-esophageal reflux disease without esophagitis: Secondary | ICD-10-CM

## 2014-03-23 DIAGNOSIS — R404 Transient alteration of awareness: Secondary | ICD-10-CM | POA: Diagnosis not present

## 2014-03-23 DIAGNOSIS — Z7982 Long term (current) use of aspirin: Secondary | ICD-10-CM | POA: Insufficient documentation

## 2014-03-23 DIAGNOSIS — Z9181 History of falling: Secondary | ICD-10-CM | POA: Diagnosis not present

## 2014-03-23 DIAGNOSIS — Z9842 Cataract extraction status, left eye: Secondary | ICD-10-CM | POA: Insufficient documentation

## 2014-03-23 DIAGNOSIS — R4020 Unspecified coma: Secondary | ICD-10-CM | POA: Diagnosis not present

## 2014-03-23 DIAGNOSIS — N400 Enlarged prostate without lower urinary tract symptoms: Secondary | ICD-10-CM | POA: Insufficient documentation

## 2014-03-23 DIAGNOSIS — I10 Essential (primary) hypertension: Secondary | ICD-10-CM | POA: Insufficient documentation

## 2014-03-23 DIAGNOSIS — R55 Syncope and collapse: Secondary | ICD-10-CM | POA: Diagnosis not present

## 2014-03-23 LAB — URINALYSIS, ROUTINE W REFLEX MICROSCOPIC
BILIRUBIN URINE: NEGATIVE
Glucose, UA: NEGATIVE mg/dL
Hgb urine dipstick: NEGATIVE
KETONES UR: NEGATIVE mg/dL
LEUKOCYTES UA: NEGATIVE
NITRITE: NEGATIVE
PROTEIN: NEGATIVE mg/dL
Specific Gravity, Urine: 1.008 (ref 1.005–1.030)
UROBILINOGEN UA: 0.2 mg/dL (ref 0.0–1.0)
pH: 7 (ref 5.0–8.0)

## 2014-03-23 LAB — TROPONIN I: Troponin I: <0.03 ng/mL (ref <0.031)

## 2014-03-23 LAB — CBC
HCT: 37 % — ABNORMAL LOW (ref 39.0–52.0)
HEMATOCRIT: 38.1 % — AB (ref 39.0–52.0)
HEMOGLOBIN: 12.6 g/dL — AB (ref 13.0–17.0)
Hemoglobin: 12.1 g/dL — ABNORMAL LOW (ref 13.0–17.0)
MCH: 30.3 pg (ref 26.0–34.0)
MCH: 29.9 pg (ref 26.0–34.0)
MCHC: 33.1 g/dL (ref 30.0–36.0)
MCHC: 32.7 g/dL (ref 30.0–36.0)
MCV: 91.6 fL (ref 78.0–100.0)
MCV: 91.4 fL (ref 78.0–100.0)
PLATELETS: 236 10*3/uL (ref 150–400)
Platelets: 227 10*3/uL (ref 150–400)
RBC: 4.16 MIL/uL — ABNORMAL LOW (ref 4.22–5.81)
RBC: 4.05 MIL/uL — AB (ref 4.22–5.81)
RDW: 14.2 % (ref 11.5–15.5)
RDW: 14.2 % (ref 11.5–15.5)
WBC: 8.1 10*3/uL (ref 4.0–10.5)
WBC: 5.7 10*3/uL (ref 4.0–10.5)

## 2014-03-23 LAB — I-STAT CHEM 8, ED
BUN: 23 mg/dL (ref 6–23)
CREATININE: 1.1 mg/dL (ref 0.50–1.35)
Calcium, Ion: 1.17 mmol/L (ref 1.13–1.30)
Chloride: 95 mmol/L — ABNORMAL LOW (ref 96–112)
Glucose, Bld: 72 mg/dL (ref 70–99)
HCT: 40 % (ref 39.0–52.0)
Hemoglobin: 13.6 g/dL (ref 13.0–17.0)
POTASSIUM: 4.3 mmol/L (ref 3.5–5.1)
SODIUM: 134 mmol/L — AB (ref 135–145)
TCO2: 26 mmol/L (ref 0–100)

## 2014-03-23 LAB — BASIC METABOLIC PANEL
ANION GAP: 7 (ref 5–15)
BUN: 22 mg/dL (ref 6–23)
CO2: 28 mmol/L (ref 19–32)
Calcium: 8.9 mg/dL (ref 8.4–10.5)
Chloride: 97 mmol/L (ref 96–112)
Creatinine, Ser: 1.04 mg/dL (ref 0.50–1.35)
GFR calc Af Amer: 75 mL/min — ABNORMAL LOW (ref >90)
GFR, EST NON AFRICAN AMERICAN: 65 mL/min — AB (ref >90)
Glucose, Bld: 76 mg/dL (ref 70–99)
POTASSIUM: 4.4 mmol/L (ref 3.5–5.1)
Sodium: 132 mmol/L — ABNORMAL LOW (ref 135–145)

## 2014-03-23 LAB — CREATININE, SERUM
Creatinine, Ser: 0.95 mg/dL (ref 0.50–1.35)
GFR calc Af Amer: 87 mL/min — ABNORMAL LOW (ref >90)
GFR calc non Af Amer: 75 mL/min — ABNORMAL LOW (ref >90)

## 2014-03-23 LAB — TSH: TSH: 1.043 u[IU]/mL (ref 0.350–4.500)

## 2014-03-23 LAB — CBG MONITORING, ED: Glucose-Capillary: 82 mg/dL (ref 70–99)

## 2014-03-23 MED ORDER — ACETAMINOPHEN 650 MG RE SUPP: 650.0000 mg | Freq: Four times a day (QID) | RECTAL | Status: DC | PRN

## 2014-03-23 MED ORDER — ACETAMINOPHEN 325 MG PO TABS: 650.0000 mg | ORAL_TABLET | Freq: Four times a day (QID) | ORAL | Status: DC | PRN

## 2014-03-23 MED ORDER — DOCUSATE SODIUM 100 MG PO CAPS
200.0000 mg | ORAL_CAPSULE | Freq: Every day | ORAL | Status: DC
  Administered 2014-03-23 – 2014-03-24 (×2): 200 mg via ORAL
  Filled 2014-03-23 (×2): qty 2

## 2014-03-23 MED ORDER — SELEGILINE HCL 5 MG PO TABS
5.0000 mg | ORAL_TABLET | ORAL | Status: DC
  Administered 2014-03-23 – 2014-03-25 (×5): 5 mg via ORAL
  Filled 2014-03-23 (×5): qty 1

## 2014-03-23 MED ORDER — LUBIPROSTONE 24 MCG PO CAPS
24.0000 ug | ORAL_CAPSULE | Freq: Two times a day (BID) | ORAL | Status: DC
  Administered 2014-03-23 – 2014-03-25 (×4): 24 ug via ORAL
  Filled 2014-03-23 (×5): qty 1

## 2014-03-23 MED ORDER — ALUM & MAG HYDROXIDE-SIMETH 200-200-20 MG/5ML PO SUSP: 30.0000 mL | Freq: Four times a day (QID) | ORAL | Status: DC | PRN

## 2014-03-23 MED ORDER — HYDROCODONE-ACETAMINOPHEN 5-325 MG PO TABS: 1.0000 | ORAL_TABLET | ORAL | Status: DC | PRN

## 2014-03-23 MED ORDER — ADULT MULTIVITAMIN W/MINERALS CH
1.0000 | ORAL_TABLET | Freq: Every morning | ORAL | Status: DC
  Administered 2014-03-24 – 2014-03-25 (×2): 1 via ORAL
  Filled 2014-03-23 (×2): qty 1

## 2014-03-23 MED ORDER — CHOLECALCIFEROL 10 MCG (400 UNIT) PO TABS
400.0000 [IU] | ORAL_TABLET | Freq: Every morning | ORAL | Status: DC
  Administered 2014-03-24 – 2014-03-25 (×2): 400 [IU] via ORAL
  Filled 2014-03-23 (×2): qty 1

## 2014-03-23 MED ORDER — ACETAMINOPHEN 500 MG PO TABS: 1000.0000 mg | ORAL_TABLET | Freq: Four times a day (QID) | ORAL | Status: DC | PRN

## 2014-03-23 MED ORDER — ASPIRIN EC 81 MG PO TBEC
81.0000 mg | DELAYED_RELEASE_TABLET | Freq: Every day | ORAL | Status: DC
  Administered 2014-03-23 – 2014-03-24 (×2): 81 mg via ORAL
  Filled 2014-03-23 (×2): qty 1

## 2014-03-23 MED ORDER — SODIUM CHLORIDE 0.9 % IJ SOLN
3.0000 mL | Freq: Two times a day (BID) | INTRAMUSCULAR | Status: DC
  Administered 2014-03-25: 3 mL via INTRAVENOUS

## 2014-03-23 MED ORDER — FAMOTIDINE 20 MG PO TABS
20.0000 mg | ORAL_TABLET | Freq: Two times a day (BID) | ORAL | Status: DC
  Administered 2014-03-23 – 2014-03-25 (×4): 20 mg via ORAL
  Filled 2014-03-23 (×4): qty 1

## 2014-03-23 MED ORDER — OMEGA-3-ACID ETHYL ESTERS 1 G PO CAPS
1.0000 g | ORAL_CAPSULE | Freq: Every day | ORAL | Status: DC
  Administered 2014-03-24 – 2014-03-25 (×2): 1 g via ORAL
  Filled 2014-03-23 (×2): qty 1

## 2014-03-23 MED ORDER — RISAQUAD PO CAPS
1.0000 | ORAL_CAPSULE | Freq: Every day | ORAL | Status: DC
  Administered 2014-03-23 – 2014-03-24 (×2): 1 via ORAL
  Filled 2014-03-23 (×2): qty 1

## 2014-03-23 MED ORDER — CARBIDOPA-LEVODOPA 25-250 MG PO TABS
1.0000 | ORAL_TABLET | ORAL | Status: DC
  Filled 2014-03-23 (×2): qty 1

## 2014-03-23 MED ORDER — ONDANSETRON HCL 4 MG PO TABS: 4.0000 mg | ORAL_TABLET | Freq: Four times a day (QID) | ORAL | Status: DC | PRN

## 2014-03-23 MED ORDER — CYANOCOBALAMIN 500 MCG PO TABS
500.0000 ug | ORAL_TABLET | Freq: Every morning | ORAL | Status: DC
  Administered 2014-03-24 – 2014-03-25 (×2): 500 ug via ORAL
  Filled 2014-03-23 (×2): qty 1

## 2014-03-23 MED ORDER — ONDANSETRON HCL 4 MG/2ML IJ SOLN: 4.0000 mg | Freq: Four times a day (QID) | INTRAMUSCULAR | Status: DC | PRN

## 2014-03-23 MED ORDER — SODIUM CHLORIDE 0.9 % IV SOLN
INTRAVENOUS | Status: DC
  Administered 2014-03-23 – 2014-03-24 (×3): via INTRAVENOUS

## 2014-03-23 MED ORDER — CARBIDOPA-LEVODOPA 25-100 MG PO TABS
1.0000 | ORAL_TABLET | ORAL | Status: DC
  Administered 2014-03-24 – 2014-03-25 (×5): 1 via ORAL
  Filled 2014-03-23 (×6): qty 1

## 2014-03-23 MED ORDER — POLYETHYLENE GLYCOL 3350 17 G PO PACK: 17.0000 g | PACK | Freq: Every day | ORAL | Status: DC | PRN

## 2014-03-23 MED ORDER — FLUTICASONE PROPIONATE 50 MCG/ACT NA SUSP: 2.0000 | Freq: Every day | NASAL | Status: DC | PRN

## 2014-03-23 MED ORDER — ENOXAPARIN SODIUM 40 MG/0.4ML ~~LOC~~ SOLN
40.0000 mg | SUBCUTANEOUS | Status: DC
  Administered 2014-03-23 – 2014-03-24 (×2): 40 mg via SUBCUTANEOUS
  Filled 2014-03-23 (×3): qty 0.4

## 2014-03-23 MED ORDER — PRAMIPEXOLE DIHYDROCHLORIDE 1 MG PO TABS
1.0000 mg | ORAL_TABLET | ORAL | Status: DC
  Administered 2014-03-23 – 2014-03-25 (×6): 1 mg via ORAL
  Filled 2014-03-23 (×7): qty 1

## 2014-03-23 MED ORDER — TRIAMCINOLONE ACETONIDE 55 MCG/ACT NA AERO: 2.0000 | INHALATION_SPRAY | Freq: Every day | NASAL | Status: DC | PRN

## 2014-03-23 MED ORDER — ALIGN 4 MG PO CAPS: 1.0000 | ORAL_CAPSULE | Freq: Every day | ORAL | Status: DC

## 2014-03-23 MED ORDER — CARBIDOPA-LEVODOPA 25-250 MG PO TABS
1.0000 | ORAL_TABLET | ORAL | Status: DC
  Administered 2014-03-23 – 2014-03-25 (×7): 1 via ORAL
  Filled 2014-03-23 (×8): qty 1

## 2014-03-23 MED ORDER — HYDROMORPHONE HCL 1 MG/ML IJ SOLN: 1.0000 mg | INTRAMUSCULAR | Status: DC | PRN

## 2014-03-23 NOTE — ED Notes (Signed)
Bed: WA01 Expected date:  Expected time:  Means of arrival:  Comments: EMS- 81yo M, syncope

## 2014-03-23 NOTE — ED Notes (Signed)
Per [facility]- Pt resides at [facility] with wife FULL CODE. Pt wife pt was sitting in kitchen chair awake and then became unresponsive. Resident RN called to assess pt. Pt aroused by sternal rub. Pt declined to go at first then agreed. Pt denies CP and SOB. GCS  NEG FOR STROKE.

## 2014-03-23 NOTE — ED Notes (Signed)
Patient transported to CT 

## 2014-03-23 NOTE — Consult Note (Signed)
Patient ID: Brian Aguirre MRN: [id number], DOB/AGE: 1929/10/28   Admit date: 03/23/2014  Reason for Consult: Syncope Referring MD: Internal Medicine  Primary Physician: Mathews Argyle, MD Primary Cardiologist: Mare Ferrari ([place])  Pt. Profile:  81 y/o male with no prior cardiac history being admitted for syncope.   Problem List  Past Medical History  Diagnosis Date  . Parkinson disease   . Osteoporosis   . Dizziness   . Depression   . Hypertension   . Gait disturbance   . Benign enlargement of prostate   . Orthostatic hypotension   . Fracture, rib   . Memory loss   . Chronic constipation 02/26/2014  . History of fall 03/09/2014  . Contusion of forehead 03/09/2014    Past Surgical History  Procedure Laterality Date  . Appendectomy    . Tonsillectomy    . Hernia repair    . Cataract extraction Left      Allergies  Allergies  Allergen Reactions  . Myrbetriq [Mirabegron] Nausea And Vomiting  . Stalevo [Carbidopa-Levodopa-Entacapone] Diarrhea    GI upset    HPI The patient is a 81 y/o male admitted for syncope. He reports he has been seen by Dr. Tamala Julian years ago but denies any significant past cardiac history (no prior office notes on file). His PMH is significant for Parkinson's disease as well as h/o orthostatic hypotension. According to he and his wife, it is not uncommon for his systolic BP to drop in the 70s in the mornings. He has been symptomatic with this in the past and reports frequent dizziness, especially in the mornings. He also suffered a fall 2.5 weeks ago due to dizziness. This was also in the setting of increased "drowsiness". His PCP subsequently discontinue his Lasix roughly 1 week ago. He resides at an independent living facility.   He and his wife both report that his BP was low this morning after waking. His SBP was in the 70s. His wife gave him Gatorade and his BP improved some. Later in the morning around 9 am, he walked to the kitchen to  take his morning meds. He suddenly felt dizzy and lightheaed and became unresponsive/ loss consciousness for several seconds. He denied CP or dyspnea. He came to the ED for further evaluation.   On arrival, BG level was 76.  CT of head shows no acute no acute intracranial pathology. Chronic ischemic changes and atrophy are noted. He was hypertensive with BP of 169/78. Pulse rate was 65. EKG show NSR w/ chronic LBBB.  Initial EKG reads "complete heart block" but this is not accurate given there is a p-wave for every QRS. Another EKG shows prolonged QTc of 525. Initial troponin is negative. Hgb and electrolytes both stable.     Home Medications  Prior to Admission medications   Medication Sig Start Date End Date Taking? Authorizing Provider  acetaminophen (TYLENOL) 500 MG tablet Take 1,000 mg by mouth every 6 (six) hours as needed for mild pain.   Yes Historical Provider, MD  aspirin EC 81 MG tablet Take 81 mg by mouth at bedtime.   Yes Historical Provider, MD  carbidopa-levodopa (SINEMET IR) 25-100 MG per tablet Take by mouth 3 (three) times daily. 9am, 2pm,6:30pm. 03/10/14  Yes Historical Provider, MD  carbidopa-levodopa (SINEMET IR) 25-250 MG per tablet Take 1 tablet by mouth. Four times a day at 6am,1130am, 4pm, and bedtime10:30pm.   Yes Historical Provider, MD  docusate sodium (COLACE) 100 MG capsule Take 200 mg by mouth  at bedtime.    Yes Historical Provider, MD  fish oil-omega-3 fatty acids 1000 MG capsule Take 1 g by mouth daily with breakfast.    Yes Historical Provider, MD  ibuprofen (ADVIL,MOTRIN) 200 MG tablet Take 400 mg by mouth every 6 (six) hours as needed (pain).   Yes Historical Provider, MD  lubiprostone (AMITIZA) 24 MCG capsule Take 1 capsule (24 mcg total) by mouth 2 (two) times daily with a meal. 02/20/14  Yes Kathrynn Ducking, MD  Multiple Vitamin (MULTIVITAMIN WITH MINERALS) TABS Take 1 tablet by mouth every morning.    Yes Historical Provider, MD  polyethylene glycol (MIRALAX /  GLYCOLAX) packet Take 17 g by mouth daily as needed (constipation).   Yes Historical Provider, MD  pramipexole (MIRAPEX) 1.5 MG tablet Take 1.5 mg by mouth 3 (three) times daily. Taken at 6 am , 2 pm and bedtime   Yes Historical Provider, MD  Probiotic Product (ALIGN) 4 MG CAPS Take 1 capsule by mouth at bedtime.   Yes Historical Provider, MD  ranitidine (ZANTAC) 75 MG tablet Take 75 mg by mouth daily as needed for heartburn (reflux).    Yes Historical Provider, MD  selegiline (ELDEPRYL) 5 MG tablet Take 5 mg by mouth 2 (two) times daily with a meal. Takes with breakfast and lunch.   Yes Historical Provider, MD  triamcinolone (NASACORT AQ) 55 MCG/ACT AERO nasal inhaler Place 2 sprays into the nose daily as needed (for allergies).    Yes Historical Provider, MD  vitamin B-12 (CYANOCOBALAMIN) 500 MCG tablet Take 500 mcg by mouth every morning.    Yes Historical Provider, MD  vitamin D, CHOLECALCIFEROL, 400 UNITS tablet Take 400 Units by mouth every morning.   Yes Historical Provider, MD    Family History  Family History  Problem Relation Age of Onset  . Cancer Mother   . Heart failure Father     Social History  History   Social History  . Marital Status: Married    Spouse Name: N/A  . Number of Children: 2  . Years of Education: College +   Occupational History  . Retired Theme park manager    Social History Main Topics  . Smoking status: Never Smoker   . Smokeless tobacco: Never Used  . Alcohol Use: No  . Drug Use: No  . Sexual Activity: Not on file   Other Topics Concern  . Not on file   Social History Narrative   Patient is right handed.   Patient drinks 1/2 cup caffeine daily.     Review of Systems General:  No chills, fever, night sweats or weight changes.  Cardiovascular:  No chest pain, dyspnea on exertion, edema, orthopnea, palpitations, paroxysmal nocturnal dyspnea. Dermatological: No rash, lesions/masses Respiratory: No cough, dyspnea Urologic: No hematuria,  dysuria Abdominal:   No nausea, vomiting, diarrhea, bright red blood per rectum, melena, or hematemesis Neurologic:  No visual changes, wkns, changes in mental status. All other systems reviewed and are otherwise negative except as noted above.  Physical Exam  Blood pressure 187/80, pulse 64, temperature 98.1 F (36.7 C), temperature source Oral, resp. rate 19, SpO2 99 %.  General: Pleasant, NAD Psych: Normal affect. Neuro: Alert and oriented X 3. Moves all extremities spontaneously. HEENT: Normal  Neck: Supple without bruits or JVD. Lungs:  Resp regular and unlabored, CTA. Heart: RRR no s3, s4, 1/6 SM at RUSB Abdomen: Soft, non-tender, non-distended, BS + x 4.  Extremities: No clubbing, cyanosis or edema. DP/PT/Radials 2+ and equal bilaterally.  Labs  Troponin (Point of Care Test) No results for input(s): TROPIPOC in the last 72 hours. No results for input(s): CKTOTAL, CKMB, TROPONINI in the last 72 hours. Lab Results  Component Value Date   WBC 8.1 03/23/2014   HGB 13.6 03/23/2014   HCT 40.0 03/23/2014   MCV 91.6 03/23/2014   PLT 227 03/23/2014    Recent Labs Lab 03/23/14 1226 03/23/14 1241  NA 132* 134*  K 4.4 4.3  CL 97 95*  CO2 28  --   BUN 22 23  CREATININE 1.04 1.10  CALCIUM 8.9  --   GLUCOSE 76 72   No results found for: CHOL, HDL, LDLCALC, TRIG No results found for: DDIMER   Radiology/Studies  Dg Chest 2 View  03/09/2014   CLINICAL DATA:  Cough for several weeks.  EXAM: CHEST  2 VIEW  COMPARISON:  November 19, 2011.  FINDINGS: The heart size and mediastinal contours are within normal limits. No pneumothorax or pleural effusion is noted. Stable interstitial densities are noted throughout both lungs concerning for scarring. No acute pulmonary disease is noted. 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  IMPRESSION: No acute cardiopulmonary abnormality seen.   Electronically Signed   By: Marijo Conception, M.D.   On:  03/09/2014 12:55   Ct Head Wo Contrast  03/23/2014   CLINICAL DATA:  Unresponsive episode  EXAM: CT HEAD WITHOUT CONTRAST  TECHNIQUE: Contiguous axial images were obtained from the base of the skull through the vertex without intravenous contrast.  COMPARISON:  03/09/2014  FINDINGS: Global atrophy. Chronic ischemic changes in the periventricular white matter and external capsules. No mass effect, midline shift, or acute hemorrhage. There is partial opacification of both maxillary sinuses and the sphenoid sinus. Fluid levels are present with mucous material. Mastoid air cells are clear. Cranium is intact.  IMPRESSION: No acute intracranial pathology. Chronic ischemic changes and atrophy are noted.   Electronically Signed   By: Marybelle Killings M.D.   On: 03/23/2014 14:52   Ct Head Wo Contrast  03/09/2014   CLINICAL DATA:  Head trauma after falling off of the toilet. Right forehead scalp hematoma.  EXAM: CT HEAD WITHOUT CONTRAST  CT CERVICAL SPINE WITHOUT CONTRAST  TECHNIQUE: Multidetector CT imaging of the head and cervical spine was performed following the standard protocol without intravenous contrast. Multiplanar CT image reconstructions of the cervical spine were also generated.  COMPARISON:  None.  FINDINGS: CT HEAD FINDINGS  No mass lesion. No midline shift. No acute hemorrhage or hematoma. No extra-axial fluid collections. No evidence of acute infarction. There is diffuse cerebral cortical atrophy with secondary ventricular dilatation. Periventricular white matter lucency consistent with chronic small vessel ischemic disease. Old small lacunar infarct in the right basal ganglia. Small scalp hematoma over the right frontal bone. Osseous structures are intact. Mucosal thickening and small amount of fluid in both maxillary sinuses, not felt to be significant.  CT CERVICAL SPINE FINDINGS  There is no fracture, subluxation, or prevertebral soft tissue swelling. Slight facet arthritis at C3-4 and C4-5 on the left  and C3-4 on the right. Small broad-based disc bulge at C5-6. Congenital incomplete posterior arch of C1.  IMPRESSION: 1. No significant abnormality of the cervical spine. 2. No acute intracranial abnormality. 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 3. Right frontal scalp hematoma.   Electronically Signed   By: Lorriane Shire M.D.   On: 03/09/2014 12:35  Ct Cervical Spine Wo Contrast  03/09/2014   CLINICAL DATA:  Head trauma after falling off of the toilet. Right forehead scalp hematoma.  EXAM: CT HEAD WITHOUT CONTRAST  CT CERVICAL SPINE WITHOUT CONTRAST  TECHNIQUE: Multidetector CT imaging of the head and cervical spine was performed following the standard protocol without intravenous contrast. Multiplanar CT image reconstructions of the cervical spine were also generated.  COMPARISON:  None.  FINDINGS: CT HEAD FINDINGS  No mass lesion. No midline shift. No acute hemorrhage or hematoma. No extra-axial fluid collections. No evidence of acute infarction. There is diffuse cerebral cortical atrophy with secondary ventricular dilatation. Periventricular white matter lucency consistent with chronic small vessel ischemic disease. Old small lacunar infarct in the right basal ganglia. Small scalp hematoma over the right frontal bone. Osseous structures are intact. Mucosal thickening and small amount of fluid in both maxillary sinuses, not felt to be significant.  CT CERVICAL SPINE FINDINGS  There is no fracture, subluxation, or prevertebral soft tissue swelling. Slight facet arthritis at C3-4 and C4-5 on the left and C3-4 on the right. Small broad-based disc bulge at C5-6. Congenital incomplete posterior arch of C1.  IMPRESSION: 1. No significant abnormality of the cervical spine. 2. No acute intracranial abnormality. 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 3. Right frontal scalp hematoma.    Electronically Signed   By: Lorriane Shire M.D.   On: 03/09/2014 12:35    ECG  NSR no ischemic abnormalities   ASSESSMENT AND PLAN  Principal Problem:   Syncope Active Problems:   Orthostatic hypotension   GERD (gastroesophageal reflux disease)   Parkinson disease   1. Syncope: CT of head negative. Physical exam notable for faint 1/6 systolic murmur at RUSB. No carotid bruits. Syncope likely secondary to orthostatic hypotension. Check orthostatics vital signs. Continue to cycle cardiac enzymes x 3. Monitor on telemetry for cardiac arrhthymias/ pauses. Agree with 2D echo to assess systolic function, wall motion, valve anatomy and the pericardium.   2. Orthostatic hypotension: Frequent recurrence. ? Midodrine   3. Prolonged QTc: continue to monitor closely. Avoid QT prolonging drugs.    SignedLyda Jester, PA-C 03/23/2014, 4:05 PM Patient was examined.  History reviewed.  His Lasix was stopped a week ago because he was having episodes of hypotension.  The episodes of hypotension have continued.  Orthostatic blood pressure readings show significant drop with standing. He takes his first Parkinson's medications at 6 AM.  He was in the process of reaching for his 9 AM medicines when he became lightheaded.  He would have fallen to the ground had not the nurse's aide caught him and lowered him into a nearby chair.  The patient has not been expressing any chest discomfort.  He has not been experiencing any significant exertional dyspnea.  His previous Lasix had been prescribed because of some mild ankle edema.  However the edema has not recurred after he has been off his Lasix for the past week. His physical examination reveals clear lung fields.  He has a soft grade 2/6 systolic ejection murmur at the aortic area.  He thinks that someone may have mentioned this to him in the past.  We'll be checking an echocardiogram. His EKG was reviewed and shows interventricular conduction  disturbance with left axis deviation and mild QTc prolongation.  We will want to avoid QT prolonging drugs as much as possible. In terms of his orthostatic hypotension, this appears to be his main problem  and is most likely secondary to his parkinsonism and to his Parkinson meds.  I would continue to avoid Lasix and would try to cut back on his Parkinson's meds and observe response.  Consider neurology consultation.  He may also benefit from a trial of midodrine.  We may need to tolerate some degree of high blood pressure when he is at rest in order to avoid significant orthostatic hypotension when he is ambulating

## 2014-03-23 NOTE — ED Provider Notes (Signed)
CSN: [id number]     Arrival date & time 03/23/14  1140 History   First MD Initiated Contact with Patient 03/23/14 1218     Chief Complaint  Patient presents with  . Loss of Consciousness     (Consider location/radiation/quality/duration/timing/severity/associated sxs/prior Treatment) HPI Comments: Patient presents to the ER for evaluation of syncope. Patient was sitting at the kitchen table when he suddenly became unresponsive. He did not fall, was laid down by wife and staff. Patient currently resides in assisted living. Blood pressure was reportedly low at time of episode. Wife reports that he did not respond for approximately 20 minutes. No seizure activity noted. Patient reports feeling severe generalized weakness currently. He has not had chest pain or shortness of breath associated with symptoms. Patient does have a history of Parkinson's disease. He reports dizziness earlier, but that is common for him. He did have a fall 2-1/2 weeks ago. He was seen in the ER at the time of the fall.  Patient is a 81 y.o. male presenting with syncope.  Loss of Consciousness Associated symptoms: dizziness   Associated symptoms: no chest pain and no shortness of breath     Past Medical History  Diagnosis Date  . Parkinson disease   . Osteoporosis   . Dizziness   . Depression   . Hypertension   . Gait disturbance   . Benign enlargement of prostate   . Orthostatic hypotension   . Fracture, rib   . Memory loss   . Chronic constipation 02/26/2014  . History of fall 03/09/2014  . Contusion of forehead 03/09/2014   Past Surgical History  Procedure Laterality Date  . Appendectomy    . Tonsillectomy    . Hernia repair    . Cataract extraction Left    Family History  Problem Relation Age of Onset  . Cancer Mother   . Heart failure Father    History  Substance Use Topics  . Smoking status: Never Smoker   . Smokeless tobacco: Never Used  . Alcohol Use: No    Review of Systems   Respiratory: Negative for shortness of breath.   Cardiovascular: Positive for syncope. Negative for chest pain.  Neurological: Positive for dizziness and tremors.  All other systems reviewed and are negative.     Allergies  Myrbetriq and Stalevo  Home Medications   Prior to Admission medications   Medication Sig Start Date End Date Taking? Authorizing Provider  acetaminophen (TYLENOL) 500 MG tablet Take 1,000 mg by mouth every 6 (six) hours as needed for mild pain.   Yes Historical Provider, MD  aspirin EC 81 MG tablet Take 81 mg by mouth at bedtime.   Yes Historical Provider, MD  carbidopa-levodopa (SINEMET IR) 25-100 MG per tablet Take by mouth 3 (three) times daily. 9am, 2pm,6:30pm. 03/10/14  Yes Historical Provider, MD  carbidopa-levodopa (SINEMET IR) 25-250 MG per tablet Take 1 tablet by mouth. Four times a day at 6am,1130am, 4pm, and bedtime10:30pm.   Yes Historical Provider, MD  docusate sodium (COLACE) 100 MG capsule Take 200 mg by mouth at bedtime.    Yes Historical Provider, MD  fish oil-omega-3 fatty acids 1000 MG capsule Take 1 g by mouth daily with breakfast.    Yes Historical Provider, MD  ibuprofen (ADVIL,MOTRIN) 200 MG tablet Take 400 mg by mouth every 6 (six) hours as needed (pain).   Yes Historical Provider, MD  lubiprostone (AMITIZA) 24 MCG capsule Take 1 capsule (24 mcg total) by mouth 2 (two) times daily  with a meal. 02/20/14  Yes Kathrynn Ducking, MD  Multiple Vitamin (MULTIVITAMIN WITH MINERALS) TABS Take 1 tablet by mouth every morning.    Yes Historical Provider, MD  polyethylene glycol (MIRALAX / GLYCOLAX) packet Take 17 g by mouth daily as needed (constipation).   Yes Historical Provider, MD  pramipexole (MIRAPEX) 1.5 MG tablet Take 1.5 mg by mouth 3 (three) times daily. Taken at 6 am , 2 pm and bedtime   Yes Historical Provider, MD  Probiotic Product (ALIGN) 4 MG CAPS Take 1 capsule by mouth at bedtime.   Yes Historical Provider, MD  ranitidine (ZANTAC) 75 MG  tablet Take 75 mg by mouth daily as needed for heartburn (reflux).    Yes Historical Provider, MD  selegiline (ELDEPRYL) 5 MG tablet Take 5 mg by mouth 2 (two) times daily with a meal. Takes with breakfast and lunch.   Yes Historical Provider, MD  triamcinolone (NASACORT AQ) 55 MCG/ACT AERO nasal inhaler Place 2 sprays into the nose daily as needed (for allergies).    Yes Historical Provider, MD  vitamin B-12 (CYANOCOBALAMIN) 500 MCG tablet Take 500 mcg by mouth every morning.    Yes Historical Provider, MD  vitamin D, CHOLECALCIFEROL, 400 UNITS tablet Take 400 Units by mouth every morning.   Yes Historical Provider, MD   BP 169/78 mmHg  Pulse 65  Temp(Src) 97.6 F (36.4 C) (Oral)  Resp 21  SpO2 100% Physical Exam  Constitutional: He is oriented to person, place, and time. He appears well-developed and well-nourished. No distress.  HENT:  Head: Normocephalic and atraumatic.  Right Ear: Hearing normal.  Left Ear: Hearing normal.  Nose: Nose normal.  Mouth/Throat: Oropharynx is clear and moist and mucous membranes are normal.  Eyes: Conjunctivae and EOM are normal. Pupils are equal, round, and reactive to light.  Neck: Normal range of motion. Neck supple.  Cardiovascular: Regular rhythm, S1 normal and S2 normal.  Exam reveals no gallop and no friction rub.   No murmur heard. Pulmonary/Chest: Effort normal and breath sounds normal. No respiratory distress. He exhibits no tenderness.  Abdominal: Soft. Normal appearance and bowel sounds are normal. There is no hepatosplenomegaly. There is no tenderness. There is no rebound, no guarding, no tenderness at McBurney's point and negative Murphy's sign. No hernia.  Musculoskeletal: Normal range of motion.  Neurological: He is alert and oriented to person, place, and time. He has normal strength. No cranial nerve deficit or sensory deficit. Coordination normal. GCS eye subscore is 4. GCS verbal subscore is 5. GCS motor subscore is 6.  Skin: Skin is  warm, dry and intact. No rash noted. No cyanosis.  Psychiatric: He has a normal mood and affect. His speech is normal and behavior is normal. Thought content normal.  Nursing note and vitals reviewed.   ED Course  Procedures (including critical care time) Labs Review Labs Reviewed  CBC - Abnormal; Notable for the following:    RBC 4.16 (*)    Hemoglobin 12.6 (*)    HCT 38.1 (*)    All other components within normal limits  BASIC METABOLIC PANEL - Abnormal; Notable for the following:    Sodium 132 (*)    GFR calc non Af Amer 65 (*)    GFR calc Af Amer 75 (*)    All other components within normal limits  I-STAT CHEM 8, ED - Abnormal; Notable for the following:    Sodium 134 (*)    Chloride 95 (*)    All other  components within normal limits  URINALYSIS, ROUTINE W REFLEX MICROSCOPIC  CBG MONITORING, ED    Imaging Review Ct Head Wo Contrast  03/23/2014   CLINICAL DATA:  Unresponsive episode  EXAM: CT HEAD WITHOUT CONTRAST  TECHNIQUE: Contiguous axial images were obtained from the base of the skull through the vertex without intravenous contrast.  COMPARISON:  03/09/2014  FINDINGS: Global atrophy. Chronic ischemic changes in the periventricular white matter and external capsules. No mass effect, midline shift, or acute hemorrhage. There is partial opacification of both maxillary sinuses and the sphenoid sinus. Fluid levels are present with mucous material. Mastoid air cells are clear. Cranium is intact.  IMPRESSION: No acute intracranial pathology. Chronic ischemic changes and atrophy are noted.   Electronically Signed   By: Marybelle Killings M.D.   On: 03/23/2014 14:52     EKG Interpretation   Date/Time:  Monday March 23 2014 11:59:56 EDT Ventricular Rate:  63 PR Interval:    QRS Duration: 126 QT Interval:  462 QTC Calculation: 473 R Axis:   -41 Text Interpretation:  Sinus rhythm with first degree AV block Left bundle  branch block No significant change since last tracing Confirmed  by POLLINA   MD, [place] [phone]) on 03/23/2014 12:34:08 PM      MDM   Final diagnoses:  Syncope    Patient presents to the ER for evaluation of syncope. Patient had unexplained syncope earlier today. He did not have any significant warning. Patient passed out and was unresponsive for up to 20 minutes according to wife. Patient is back to his baseline now. Workup has been unremarkable. Patient will require overnight monitoring for unexplained syncope, although it is likely related to his Parkinson's.    Orpah Greek, MD 03/23/14 1535

## 2014-03-23 NOTE — ED Notes (Signed)
Pt's wife sts that she has been medicating pt with regular medication while in the ED. Pt has receved mirapex and levadopa from wife.

## 2014-03-23 NOTE — ED Notes (Signed)
Charge called and notified of 20 min secretary will let her know ...klj 15:33 Pt can go at 16:00

## 2014-03-23 NOTE — H&P (Signed)
History and Physical       Hospital Admission Note Date: 03/23/2014  Patient name: Brian Aguirre Medical record number: [id number] Date of birth: 06-08-1929 Age: 81 y.o. Gender: male  PCP: Mathews Argyle, MD    Chief Complaint:  Syncopal episode today  HPI: Patient is a 81 year old male with history of Parkinson's disease, gait disorder, orthostatic hypotension usually noted in the mornings presented to ED with syncopal episode today. History was obtained from the patient, his wife and son-in-law present in the room. The patient reported that currently is a resident of assisted living facility, was going to his kitchen to get his morning medications when he felt dizzy and lightheaded. His home health aide sat him down and per his wife he was not responding for approximately 20 minutes. No facial drooping, slurred speech or seizure activity was noted. [name] reported that he has a history of orthostatic hypotension and usually blood pressure falls to 70s in the mornings. Dizziness is common for him and he had a previous fall 2 and half weeks ago. Patient follows [name] from neurology service who had recommended cutting back on Mirapex and if needed midodrine to be added in the morning. Patient denied any chest pain or shortness of breath or any palpitations prior to the event. Hospitalist service was recommended for observation. Initial EKG in the ED showed third-degree heart block, repeat EKG showed left bundle branch block which is chronic compared to previous EKGs, prolonged QTC 526.  Review of Systems:  Constitutional: Denies fever, chills, diaphoresis, poor appetite and fatigue.  HEENT: Denies photophobia, eye pain, redness, hearing loss, ear pain, congestion, sore throat, rhinorrhea, sneezing, mouth sores, trouble swallowing, neck pain, neck stiffness and tinnitus.   Respiratory: Denies SOB, DOE, cough, chest tightness,   and wheezing.   Cardiovascular: Denies chest pain, palpitations and leg swelling.  Gastrointestinal: Denies nausea, vomiting, abdominal pain, diarrhea, constipation, blood in stool and abdominal distention.  Genitourinary: Denies dysuria, urgency, frequency, hematuria, flank pain and difficulty urinating.  Musculoskeletal: Denies myalgias, back pain, joint swelling, arthralgias and gait problem.  Skin: Denies pallor, rash and wound.  Neurological: Denies numbness or tingling or headaches..  please see history of present illness Hematological: Denies adenopathy. Easy bruising, personal or family bleeding history  Psychiatric/Behavioral: Denies suicidal ideation, mood changes, confusion, nervousness, sleep disturbance and agitation  Past Medical History: Past Medical History  Diagnosis Date  . Parkinson disease   . Osteoporosis   . Dizziness   . Depression   . Hypertension   . Gait disturbance   . Benign enlargement of prostate   . Orthostatic hypotension   . Fracture, rib   . Memory loss   . Chronic constipation 02/26/2014  . History of fall 03/09/2014  . Contusion of forehead 03/09/2014   Past Surgical History  Procedure Laterality Date  . Appendectomy    . Tonsillectomy    . Hernia repair    . Cataract extraction Left     Medications: Prior to Admission medications   Medication Sig Start Date End Date Taking? Authorizing Provider  acetaminophen (TYLENOL) 500 MG tablet Take 1,000 mg by mouth every 6 (six) hours as needed for mild pain.   Yes Historical Provider, MD  aspirin EC 81 MG tablet Take 81 mg by mouth at bedtime.   Yes Historical Provider, MD  carbidopa-levodopa (SINEMET IR) 25-100 MG per tablet Take by mouth 3 (three) times daily. 9am, 2pm,6:30pm. 03/10/14  Yes Historical Provider, MD  carbidopa-levodopa (SINEMET IR) 25-250  MG per tablet Take 1 tablet by mouth. Four times a day at 6am,1130am, 4pm, and bedtime10:30pm.   Yes Historical Provider, MD  docusate sodium  (COLACE) 100 MG capsule Take 200 mg by mouth at bedtime.    Yes Historical Provider, MD  fish oil-omega-3 fatty acids 1000 MG capsule Take 1 g by mouth daily with breakfast.    Yes Historical Provider, MD  ibuprofen (ADVIL,MOTRIN) 200 MG tablet Take 400 mg by mouth every 6 (six) hours as needed (pain).   Yes Historical Provider, MD  lubiprostone (AMITIZA) 24 MCG capsule Take 1 capsule (24 mcg total) by mouth 2 (two) times daily with a meal. 02/20/14  Yes Kathrynn Ducking, MD  Multiple Vitamin (MULTIVITAMIN WITH MINERALS) TABS Take 1 tablet by mouth every morning.    Yes Historical Provider, MD  polyethylene glycol (MIRALAX / GLYCOLAX) packet Take 17 g by mouth daily as needed (constipation).   Yes Historical Provider, MD  pramipexole (MIRAPEX) 1.5 MG tablet Take 1.5 mg by mouth 3 (three) times daily. Taken at 6 am , 2 pm and bedtime   Yes Historical Provider, MD  Probiotic Product (ALIGN) 4 MG CAPS Take 1 capsule by mouth at bedtime.   Yes Historical Provider, MD  ranitidine (ZANTAC) 75 MG tablet Take 75 mg by mouth daily as needed for heartburn (reflux).    Yes Historical Provider, MD  selegiline (ELDEPRYL) 5 MG tablet Take 5 mg by mouth 2 (two) times daily with a meal. Takes with breakfast and lunch.   Yes Historical Provider, MD  triamcinolone (NASACORT AQ) 55 MCG/ACT AERO nasal inhaler Place 2 sprays into the nose daily as needed (for allergies).    Yes Historical Provider, MD  vitamin B-12 (CYANOCOBALAMIN) 500 MCG tablet Take 500 mcg by mouth every morning.    Yes Historical Provider, MD  vitamin D, CHOLECALCIFEROL, 400 UNITS tablet Take 400 Units by mouth every morning.   Yes Historical Provider, MD    Allergies:   Allergies  Allergen Reactions  . Myrbetriq [Mirabegron] Nausea And Vomiting  . Stalevo [Carbidopa-Levodopa-Entacapone] Diarrhea    GI upset    Social History: Patient reported that he has never smoked. He has never used smokeless tobacco. He reports that he does not drink  alcohol or use illicit drugs. he is currently resident of assisted-living facility   Family History: Family History  Problem Relation Age of Onset  . Cancer Mother   . Heart failure Father     Physical Exam: Blood pressure 169/78, pulse 65, temperature 97.6 F (36.4 C), temperature source Oral, resp. rate 21, SpO2 100 %. General: Alert, awake, oriented x3, in no acute distress. HEENT: normocephalic, atraumatic, anicteric sclera, pink conjunctiva, pupils equal and reactive to light and accomodation, oropharynx clear Neck: supple, no masses or lymphadenopathy, no goiter, no bruits  Heart: Regular rate and rhythm, without murmurs, rubs or gallops. Lungs: Clear to auscultation bilaterally, no wheezing, rales or rhonchi. Abdomen: Soft, nontender, nondistended, positive bowel sounds, no masses. Extremities: No clubbing, cyanosis or edema with positive pedal pulses. Neuro: Grossly intact, no focal neurological deficits, strength 5/5 upper and lower extremities bilaterally Psych: alert and oriented x 3, normal mood and affect Skin: no rashes or lesions, warm and dry   LABS on Admission:  Basic Metabolic Panel:  Recent Labs Lab 03/23/14 1226 03/23/14 1241  NA 132* 134*  K 4.4 4.3  CL 97 95*  CO2 28  --   GLUCOSE 76 72  BUN 22 23  CREATININE 1.04  1.10  CALCIUM 8.9  --    Liver Function Tests: No results for input(s): AST, ALT, ALKPHOS, BILITOT, PROT, ALBUMIN in the last 168 hours. No results for input(s): LIPASE, AMYLASE in the last 168 hours. No results for input(s): AMMONIA in the last 168 hours. CBC:  Recent Labs Lab 03/23/14 1226 03/23/14 1241  WBC 8.1  --   HGB 12.6* 13.6  HCT 38.1* 40.0  MCV 91.6  --   PLT 227  --    Cardiac Enzymes: No results for input(s): CKTOTAL, CKMB, CKMBINDEX, TROPONINI in the last 168 hours. BNP: Invalid input(s): POCBNP CBG:  Recent Labs Lab 03/23/14 1214  GLUCAP 82     Radiological Exams on Admission: Dg Chest 2  View  03/09/2014   CLINICAL DATA:  Cough for several weeks.  EXAM: CHEST  2 VIEW  COMPARISON:  November 19, 2011.  FINDINGS: The heart size and mediastinal contours are within normal limits. No pneumothorax or pleural effusion is noted. Stable interstitial densities are noted throughout both lungs concerning for scarring. No acute pulmonary disease is noted. 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  IMPRESSION: No acute cardiopulmonary abnormality seen.   Electronically Signed   By: Marijo Conception, M.D.   On: 03/09/2014 12:55   Ct Head Wo Contrast  03/23/2014   CLINICAL DATA:  Unresponsive episode  EXAM: CT HEAD WITHOUT CONTRAST  TECHNIQUE: Contiguous axial images were obtained from the base of the skull through the vertex without intravenous contrast.  COMPARISON:  03/09/2014  FINDINGS: Global atrophy. Chronic ischemic changes in the periventricular white matter and external capsules. No mass effect, midline shift, or acute hemorrhage. There is partial opacification of both maxillary sinuses and the sphenoid sinus. Fluid levels are present with mucous material. Mastoid air cells are clear. Cranium is intact.  IMPRESSION: No acute intracranial pathology. Chronic ischemic changes and atrophy are noted.   Electronically Signed   By: Marybelle Killings M.D.   On: 03/23/2014 14:52   Ct Head Wo Contrast  03/09/2014   CLINICAL DATA:  Head trauma after falling off of the toilet. Right forehead scalp hematoma.  EXAM: CT HEAD WITHOUT CONTRAST  CT CERVICAL SPINE WITHOUT CONTRAST  TECHNIQUE: Multidetector CT imaging of the head and cervical spine was performed following the standard protocol without intravenous contrast. Multiplanar CT image reconstructions of the cervical spine were also generated.  COMPARISON:  None.  FINDINGS: CT HEAD FINDINGS  No mass lesion. No midline shift. No acute hemorrhage or hematoma. No extra-axial fluid collections. No evidence of acute infarction. There is  diffuse cerebral cortical atrophy with secondary ventricular dilatation. Periventricular white matter lucency consistent with chronic small vessel ischemic disease. Old small lacunar infarct in the right basal ganglia. Small scalp hematoma over the right frontal bone. Osseous structures are intact. Mucosal thickening and small amount of fluid in both maxillary sinuses, not felt to be significant.  CT CERVICAL SPINE FINDINGS  There is no fracture, subluxation, or prevertebral soft tissue swelling. Slight facet arthritis at C3-4 and C4-5 on the left and C3-4 on the right. Small broad-based disc bulge at C5-6. Congenital incomplete posterior arch of C1.  IMPRESSION: 1. No significant abnormality of the cervical spine. 2. No acute intracranial abnormality. 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 3. Right frontal scalp hematoma.   Electronically Signed   By: Lorriane Shire M.D.   On: 03/09/2014 12:35   Ct Cervical Spine  Wo Contrast  03/09/2014   CLINICAL DATA:  Head trauma after falling off of the toilet. Right forehead scalp hematoma.  EXAM: CT HEAD WITHOUT CONTRAST  CT CERVICAL SPINE WITHOUT CONTRAST  TECHNIQUE: Multidetector CT imaging of the head and cervical spine was performed following the standard protocol without intravenous contrast. Multiplanar CT image reconstructions of the cervical spine were also generated.  COMPARISON:  None.  FINDINGS: CT HEAD FINDINGS  No mass lesion. No midline shift. No acute hemorrhage or hematoma. No extra-axial fluid collections. No evidence of acute infarction. There is diffuse cerebral cortical atrophy with secondary ventricular dilatation. Periventricular white matter lucency consistent with chronic small vessel ischemic disease. Old small lacunar infarct in the right basal ganglia. Small scalp hematoma over the right frontal bone. Osseous structures are intact. Mucosal thickening and small amount of fluid in  both maxillary sinuses, not felt to be significant.  CT CERVICAL SPINE FINDINGS  There is no fracture, subluxation, or prevertebral soft tissue swelling. Slight facet arthritis at C3-4 and C4-5 on the left and C3-4 on the right. Small broad-based disc bulge at C5-6. Congenital incomplete posterior arch of C1.  IMPRESSION: 1. No significant abnormality of the cervical spine. 2. No acute intracranial abnormality. 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 3. Right frontal scalp hematoma.   Electronically Signed   By: Lorriane Shire M.D.   On: 03/09/2014 12:35    EKG repeat showed rate of  63, prolonged QT C5 126, left bundle branch block. EKG that was initially done in the ED showed complete heart block, per EDP could be due to lead was placement. Compared to previous EKGs, left bundle branch block is not new  Assessment/Plan Principal Problem:   Syncope: Likely due to autonomic dysfunction from Parkinson's disease, medication effect, recurrent orthostatic hypotension, patient also has prolonged QTC - Admit for observation, check orthostatic vitals, patient has significantly fluctuating BP, during my encounter 193/98, however in the mornings in 70s.  -Obtain serial troponins, cortisol, B12, folate for further workup - Obtain 2-D echocardiogram - Patient follows Dr. Daneen Schick, will follow cardiology recommendations. I have decreased Mirapex to 1 mg TID (from 1.5mg  TID) as per neurology recommendations, will follow cardiology recommendations  regarding midodrine as patient also has elevated BP currently.  - Follow serial EKGs for prolonged QTC    Active Problems:   Orthostatic hypotension - Patient has a history of orthostatic hypotension, refer to #1     GERD (gastroesophageal reflux disease) - Continue Pepcid     Parkinson disease - Continue Sinemet, Mirapex   DVT prophylaxis:  Lovenox   CODE STATUS: FULL CODE STATUS   Family  Communication: Admission, patients condition and plan of care including tests being ordered have been discussed with the patient, wife and son-in-law who indicates understanding and agree with the plan and Code Status   Further plan will depend as patient's clinical course evolves and further radiologic and laboratory data become available.   Time Spent on Admission: 1 hour  Josi Roediger M.D. [facility] 03/23/2014, 4:00 PM Pager: [phone]  If 7PM-7AM, please contact night-coverage [email or web address] Password [email or web address]

## 2014-03-24 ENCOUNTER — Telehealth: Payer: Self-pay | Admitting: Neurology

## 2014-03-24 DIAGNOSIS — K219 Gastro-esophageal reflux disease without esophagitis: Secondary | ICD-10-CM | POA: Diagnosis not present

## 2014-03-24 DIAGNOSIS — I951 Orthostatic hypotension: Secondary | ICD-10-CM | POA: Diagnosis not present

## 2014-03-24 DIAGNOSIS — R55 Syncope and collapse: Secondary | ICD-10-CM | POA: Diagnosis not present

## 2014-03-24 DIAGNOSIS — G20 Parkinson's disease: Secondary | ICD-10-CM | POA: Diagnosis not present

## 2014-03-24 LAB — BASIC METABOLIC PANEL
ANION GAP: 8 (ref 5–15)
BUN: 18 mg/dL (ref 6–23)
CO2: 26 mmol/L (ref 19–32)
Calcium: 8.8 mg/dL (ref 8.4–10.5)
Chloride: 103 mmol/L (ref 96–112)
Creatinine, Ser: 0.85 mg/dL (ref 0.50–1.35)
GFR calc Af Amer: >90 mL/min (ref >90)
GFR calc non Af Amer: 79 mL/min — ABNORMAL LOW (ref >90)
Glucose, Bld: 85 mg/dL (ref 70–99)
Potassium: 3.9 mmol/L (ref 3.5–5.1)
SODIUM: 137 mmol/L (ref 135–145)

## 2014-03-24 LAB — TROPONIN I
Troponin I: <0.03 ng/mL (ref <0.031)
Troponin I: <0.03 ng/mL (ref <0.031)

## 2014-03-24 LAB — URINE CULTURE: Colony Count: 70000

## 2014-03-24 LAB — CORTISOL: CORTISOL PLASMA: 9.2 ug/dL

## 2014-03-24 LAB — CBC
HEMATOCRIT: 35.4 % — AB (ref 39.0–52.0)
Hemoglobin: 11.7 g/dL — ABNORMAL LOW (ref 13.0–17.0)
MCH: 29.9 pg (ref 26.0–34.0)
MCHC: 33.1 g/dL (ref 30.0–36.0)
MCV: 90.5 fL (ref 78.0–100.0)
Platelets: 224 10*3/uL (ref 150–400)
RBC: 3.91 MIL/uL — AB (ref 4.22–5.81)
RDW: 14.3 % (ref 11.5–15.5)
WBC: 5.1 10*3/uL (ref 4.0–10.5)

## 2014-03-24 LAB — FOLATE

## 2014-03-24 LAB — VITAMIN B12: Vitamin B-12: 1116 pg/mL — ABNORMAL HIGH (ref 211–911)

## 2014-03-24 MED ORDER — PRAMIPEXOLE DIHYDROCHLORIDE 1 MG PO TABS: 1.0000 mg | ORAL_TABLET | Freq: Three times a day (TID) | ORAL | Status: DC

## 2014-03-24 MED ORDER — HYDRALAZINE HCL 20 MG/ML IJ SOLN
10.0000 mg | Freq: Once | INTRAMUSCULAR | Status: AC
  Administered 2014-03-24: 10 mg via INTRAVENOUS
  Filled 2014-03-24: qty 1

## 2014-03-24 NOTE — Progress Notes (Signed)
CSW received consult that patient was admitted from facility. CSW called & confirmed with [place] (ph#: [phone]) that he came to the hospital from [place]. Awaiting PT evaluation with disposition recommendation, though patient is currently under observation status.      Brian Aguirre, [facility] Clinical Social Worker cell #: [phone]

## 2014-03-24 NOTE — Progress Notes (Signed)
UR completed 

## 2014-03-24 NOTE — Progress Notes (Signed)
SUBJECTIVE:  No complaints this am   OBJECTIVE:   Vitals:   Filed Vitals:   03/23/14 1631 03/23/14 1634 03/23/14 2155 03/24/14 0543  BP: 190/88  146/65   Pulse:   64 60  Temp:   98.1 F (36.7 C) 98.1 F (36.7 C)  TempSrc:   Oral Oral  Resp:   20 18  Height:  5\' 10"  (1.778 m)    Weight:  152 lb 8.9 oz (69.2 kg)    SpO2:   98% 95%   I&O's:   Intake/Output Summary (Last 24 hours) at 03/24/14 0750 Last data filed at 03/24/14 0700  Gross per 24 hour  Intake 1388.75 ml  Output   1075 ml  Net 313.75 ml   TELEMETRY: Reviewed telemetry pt in NSR:     PHYSICAL EXAM General: Well developed, well nourished, in no acute distress Head: Eyes PERRLA, No xanthomas.   Normal cephalic and atramatic  Lungs:   Clear bilaterally to auscultation and percussion. Heart:   HRRR S1 S2 Pulses are 2+ & equal. Abdomen: Bowel sounds are positive, abdomen soft and non-tender without masses  Extremities:   No clubbing, cyanosis or edema.  DP +1 Neuro: Alert and oriented X 3. Psych:  Good affect, responds appropriately   LABS: Basic Metabolic Panel:  Recent Labs  03/23/14 1226 03/23/14 1241 03/23/14 1734 03/24/14 0524  NA 132* 134*  --  137  K 4.4 4.3  --  3.9  CL 97 95*  --  103  CO2 28  --   --  26  GLUCOSE 76 72  --  85  BUN 22 23  --  18  CREATININE 1.04 1.10 0.95 0.85  CALCIUM 8.9  --   --  8.8   Liver Function Tests: No results for input(s): AST, ALT, ALKPHOS, BILITOT, PROT, ALBUMIN in the last 72 hours. No results for input(s): LIPASE, AMYLASE in the last 72 hours. CBC:  Recent Labs  03/23/14 1734 03/24/14 0524  WBC 5.7 5.1  HGB 12.1* 11.7*  HCT 37.0* 35.4*  MCV 91.4 90.5  PLT 236 224   Cardiac Enzymes:  Recent Labs  03/23/14 1734 03/23/14 2302 03/24/14 0524  TROPONINI <0.03 <0.03 <0.03   BNP: Invalid input(s): POCBNP D-Dimer: No results for input(s): DDIMER in the last 72 hours. Hemoglobin A1C: No results for input(s): HGBA1C in the last 72  hours. Fasting Lipid Panel: No results for input(s): CHOL, HDL, LDLCALC, TRIG, CHOLHDL, LDLDIRECT in the last 72 hours. Thyroid Function Tests:  Recent Labs  03/23/14 1724  TSH 1.043   Anemia Panel:  Recent Labs  03/23/14 1734  VITAMINB12 1116*  FOLATE >20.0   Coag Panel:   No results found for: INR, PROTIME  RADIOLOGY: Dg Chest 2 View  03/09/2014   CLINICAL DATA:  Cough for several weeks.  EXAM: CHEST  2 VIEW  COMPARISON:  November 19, 2011.  FINDINGS: The heart size and mediastinal contours are within normal limits. No pneumothorax or pleural effusion is noted. Stable interstitial densities are noted throughout both lungs concerning for scarring. No acute pulmonary disease is noted. 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  IMPRESSION: No acute cardiopulmonary abnormality seen.   Electronically Signed   By: Marijo Conception, M.D.   On: 03/09/2014 12:55   Ct Head Wo Contrast  03/23/2014   CLINICAL DATA:  Unresponsive episode  EXAM: CT HEAD WITHOUT CONTRAST  TECHNIQUE: Contiguous axial images were obtained from the base of  the skull through the vertex without intravenous contrast.  COMPARISON:  03/09/2014  FINDINGS: Global atrophy. Chronic ischemic changes in the periventricular white matter and external capsules. No mass effect, midline shift, or acute hemorrhage. There is partial opacification of both maxillary sinuses and the sphenoid sinus. Fluid levels are present with mucous material. Mastoid air cells are clear. Cranium is intact.  IMPRESSION: No acute intracranial pathology. Chronic ischemic changes and atrophy are noted.   Electronically Signed   By: Marybelle Killings M.D.   On: 03/23/2014 14:52   Ct Head Wo Contrast  03/09/2014   CLINICAL DATA:  Head trauma after falling off of the toilet. Right forehead scalp hematoma.  EXAM: CT HEAD WITHOUT CONTRAST  CT CERVICAL SPINE WITHOUT CONTRAST  TECHNIQUE: Multidetector CT imaging of the head and cervical  spine was performed following the standard protocol without intravenous contrast. Multiplanar CT image reconstructions of the cervical spine were also generated.  COMPARISON:  None.  FINDINGS: CT HEAD FINDINGS  No mass lesion. No midline shift. No acute hemorrhage or hematoma. No extra-axial fluid collections. No evidence of acute infarction. There is diffuse cerebral cortical atrophy with secondary ventricular dilatation. Periventricular white matter lucency consistent with chronic small vessel ischemic disease. Old small lacunar infarct in the right basal ganglia. Small scalp hematoma over the right frontal bone. Osseous structures are intact. Mucosal thickening and small amount of fluid in both maxillary sinuses, not felt to be significant.  CT CERVICAL SPINE FINDINGS  There is no fracture, subluxation, or prevertebral soft tissue swelling. Slight facet arthritis at C3-4 and C4-5 on the left and C3-4 on the right. Small broad-based disc bulge at C5-6. Congenital incomplete posterior arch of C1.  IMPRESSION: 1. No significant abnormality of the cervical spine. 2. No acute intracranial abnormality. 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 3. Right frontal scalp hematoma.   Electronically Signed   By: Lorriane Shire M.D.   On: 03/09/2014 12:35   Ct Cervical Spine Wo Contrast  03/09/2014   CLINICAL DATA:  Head trauma after falling off of the toilet. Right forehead scalp hematoma.  EXAM: CT HEAD WITHOUT CONTRAST  CT CERVICAL SPINE WITHOUT CONTRAST  TECHNIQUE: Multidetector CT imaging of the head and cervical spine was performed following the standard protocol without intravenous contrast. Multiplanar CT image reconstructions of the cervical spine were also generated.  COMPARISON:  None.  FINDINGS: CT HEAD FINDINGS  No mass lesion. No midline shift. No acute hemorrhage or hematoma. No extra-axial fluid collections. No evidence of acute infarction. There  is diffuse cerebral cortical atrophy with secondary ventricular dilatation. Periventricular white matter lucency consistent with chronic small vessel ischemic disease. Old small lacunar infarct in the right basal ganglia. Small scalp hematoma over the right frontal bone. Osseous structures are intact. Mucosal thickening and small amount of fluid in both maxillary sinuses, not felt to be significant.  CT CERVICAL SPINE FINDINGS  There is no fracture, subluxation, or prevertebral soft tissue swelling. Slight facet arthritis at C3-4 and C4-5 on the left and C3-4 on the right. Small broad-based disc bulge at C5-6. Congenital incomplete posterior arch of C1.  IMPRESSION: 1. No significant abnormality of the cervical spine. 2. No acute intracranial abnormality. 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 3. Right frontal scalp hematoma.   Electronically Signed   By: Lorriane Shire M.D.   On: 03/09/2014 12:35    ASSESSMENT AND PLAN  Principal Problem:  Syncope Active Problems:  Orthostatic hypotension  GERD (gastroesophageal reflux disease)  Parkinson disease   1. Syncope: CT of head negative. Physical exam notable for faint 1/6 systolic murmur at RUSB. No carotid bruits. Syncope likely secondary to orthostatic hypotension.  Orthostatics vital signs were positive with SBP dropping from 190 to 97 with standing.  Still mildly orthostatic this am. Cardiac enzymes negative x 3. Monitor on telemetry for cardiac arrhthymias/ pauses.   2D echo to assess systolic function, wall motion, valve anatomy and the pericardium.   2. Orthostatic hypotension: Frequent recurrence.  Would recommend ted hose stockings and consider abdominal binder.  Would avoid midodrine since he has a tendency towards hypertension unless he continues to have orthostatic hypotension with compression hose.  3. Prolonged QTc: continue to monitor closely. Avoid QT prolonging  drugs.    Sueanne Margarita, MD  03/24/2014  7:50 AM

## 2014-03-24 NOTE — Care Management Note (Addendum)
    Page 1 of 2   03/25/2014     2:25:53 PM CARE MANAGEMENT NOTE 03/25/2014  Patient:  DEWEL, LOTTER A   Account Number:  [id number]  Date Initiated:  03/24/2014  Documentation initiated by:  Dessa Phi  Subjective/Objective Assessment:   81 y/o m admitted w/syncope.     Action/Plan:   From [facility]   Anticipated DC Date:  03/25/2014   Anticipated DC Plan:  [place]  CM consult      Choice offered to / List presented to:  C-3 Spouse        HH arranged  HH-2 PT  HH-3 OT      [facility] agency  [place]   Status of service:  Completed, signed off Medicare Important Message given?   (If response is "NO", the following Medicare IM given date fields will be blank) Date Medicare IM given:   Medicare IM given by:   Date Additional Medicare IM given:   Additional Medicare IM given by:    Discharge Disposition:  [place]  Per UR Regulation:  Reviewed for med. necessity/level of care/duration of stay  If discussed at [place] of Stay Meetings, dates discussed:    Comments:  03/25/14 Dessa Phi RN BSN NCM [phone] [place] orders-HHPT/OT.Albertha Ghee aware of [place] orders.No further d/c needs.  03/24/14 Dessa Phi RN BSN NCM [phone] 4P-Spoke to patient/dtr/spouse in rm w/[facility] rep about d/c plans since [place] duty nsg was already providing services.  [facility] chosen for [facility]. Recommend HHRN/HHPT/HHOT.Await final HHC order. 1:40p-Received call from [place] that family interested in [facility] options, has used [facility] in past.Will discuss [place] options @ 3:30p per request from family.TC [facility] rep [place] aware of potential referral, & be available if family had further concerns about HHC w/[facility]. Await PT recommendations.

## 2014-03-24 NOTE — Telephone Encounter (Signed)
I talked to Dr. Tana Coast in the hospital. The patient appears to be orthostatic in the morning, more hypertensive later in the day. I would not alter the Sinemet dosing at this time, we are reducing the Mirapex. Cardiology does not wish to use midodrine in the morning. We will use abdominal binders, and compression stockings. They will monitor this issue in the hospital.  I called that, the daughter, discussed the above issues as well with her. She appears to be in agreement.

## 2014-03-24 NOTE — Progress Notes (Signed)
  Echocardiogram 2D Echocardiogram has been performed.  Darlina Sicilian M 03/24/2014, 3:58 PM

## 2014-03-24 NOTE — Progress Notes (Signed)
Clinical Social Work  CSW met with patient introduced self and explained role. CSW received consult from PT recommending 24 hour supervision. CSW explained to patient and family what 24 hour supervision means, and if patient was to go to [facility] SN[facility], they would have to pay privately. Daughter who is at bedside, states that patient would benefit from [facility] which she has set up with [facility]. Patient is a resident at the [place], where he will return with Home Health services. RN Case Manager Juliann Pulse is aware and will meet with family today to discuss home health needs. CSW is signing off but if needed can be contacted at [phone].  Glorious Peach BSW Intern

## 2014-03-24 NOTE — Progress Notes (Addendum)
BP 219/90 manually. Pt asymptomatic. NP on call paged. New order placed. Will recheck BP  BP 173/78 after Hydralazine 10 mg IV. Will continue to monitor closely.

## 2014-03-24 NOTE — Evaluation (Signed)
Occupational Therapy Evaluation Patient Details Name: Brian Aguirre MRN: [id number] DOB: 07/22/1929 Today's Date: 03/24/2014    History of Present Illness 81 yo male admitted with syncope. Hx of Parkinsons, falls, osteoporosis, orthostatic hypotension. Pt is from [facility] with aide during the day.   Clinical Impression   Pt abl e tolerate up with PT/OT for functional ADL. Family present. Pt with some initial lightheadedness with standing. BP sitting 152/58, standing 132/86 and after ambulation 172/67. Nursing made aware. Will follow on acute to progress ADL independence and safety.     Follow Up Recommendations  Home health OT;Supervision/Assistance - 24 hour    Equipment Recommendations  None recommended by OT    Recommendations for Other Services       Precautions / Restrictions Precautions Precautions: Fall Precaution Comments: orthostatic Restrictions Weight Bearing Restrictions: No      Mobility Bed Mobility               General bed mobility comments: pt oob in recliner  Transfers Overall transfer level: Needs assistance Equipment used: Rolling walker (2 wheeled) Transfers: Sit to/from Stand Sit to Stand: Min assist              Balance Overall balance assessment: Needs assistance         Standing balance support: Bilateral upper extremity supported;During functional activity Standing balance-Leahy Scale: Poor                              ADL Overall ADL's : Needs assistance/impaired Eating/Feeding: Independent;Sitting   Grooming: Wash/dry hands;Set up;Sitting   Upper Body Bathing: Set up;Sitting;Supervision/ safety   Lower Body Bathing: Moderate assistance;Sit to/from stand   Upper Body Dressing : Sitting;Minimal assistance   Lower Body Dressing: Moderate assistance;Sit to/from stand   Toilet Transfer: +2 for safety/equipment;Minimal assistance;Ambulation;RW   Toileting- Clothing Manipulation and Hygiene: Moderate  assistance;Sit to/from stand         General ADL Comments: family present and gave some of PLOF and history. Pt has had several falls close together that occurred several weeks ago. They state his balance in standing is not good and he has to have someone nearby when he is standing and letting go of the walker to manage clothing, groom, etc. Pt complaining of some wooziness initially in standing and walking but able to tolerate ambulation--see PT note. Pt did well with crossing LEs up to don socks.      Vision     Perception     Praxis      Pertinent Vitals/Pain Pain Assessment: No/denies pain     Hand Dominance     Extremity/Trunk Assessment Upper Extremity Assessment Upper Extremity Assessment: Generalized weakness      Cervical / Trunk Assessment Cervical / Trunk Assessment: Kyphotic   Communication Communication Communication: No difficulties   Cognition Arousal/Alertness: Awake/alert Behavior During Therapy: WFL for tasks assessed/performed Overall Cognitive Status: Within Functional Limits for tasks assessed                     General Comments       Exercises       Shoulder Instructions      Home Living   Living Arrangements: Spouse/significant other Available Help at Discharge: Family Type of Home: Independent living facility       Home Layout: One level     Bathroom Shower/Tub: Occupational psychologist: Handicapped height  Home Equipment: Walker - 2 wheels;Grab bars - toilet;Grab bars - tub/shower          Prior Functioning/Environment Level of Independence: Needs assistance  Gait / Transfers Assistance Needed: uses walker. walks to dining room  ADL's / Homemaking Assistance Needed: has assist with ADL especially for standing balance to manage clothing, etc. Family reports his balance in standing is not good.        OT Diagnosis: Generalized weakness   OT Problem List: Decreased strength;Decreased knowledge of  use of DME or AE   OT Treatment/Interventions: Self-care/ADL training;Patient/family education;Therapeutic activities;DME and/or AE instruction    OT Goals(Current goals can be found in the care plan section) Acute Rehab OT Goals Patient Stated Goal: home OT Goal Formulation: With patient/family Time For Goal Achievement: 04/07/14 Potential to Achieve Goals: Good  OT Frequency: Min 2X/week   Barriers to D/C:            Co-evaluation              End of Session Equipment Utilized During Treatment: Gait belt;Rolling walker  Activity Tolerance: Patient tolerated treatment well (some initial light headedness but able to progress) Patient left: in chair;with call bell/phone within reach;with family/visitor present;with chair alarm set   Time: 7209-4709 OT Time Calculation (min): 31 min Charges:  OT General Charges $OT Visit: 1 Procedure OT Evaluation $Initial OT Evaluation Tier I: 1 Procedure G-Codes: OT G-codes **NOT FOR INPATIENT CLASS** Functional Assessment Tool Used: clinical judgement Functional Limitation: Self care Self Care Current Status (G2836): At least 20 percent but less than 40 percent impaired, limited or restricted Self Care Goal Status (O2947): At least 1 percent but less than 20 percent impaired, limited or restricted  Jules Schick  [phone] 03/24/2014, 1:17 PM

## 2014-03-24 NOTE — Progress Notes (Signed)
Patient ID: Brian Aguirre  male  CVE:[id number]    DOB: 08-19-1929    DOA: 03/23/2014  PCP: Mathews Argyle, MD  Brief history of present illness Patient is a 81 year old male with history of Parkinson's disease, gait disorder, orthostatic hypotension usually noted in the mornings presented to ED with syncopal episode today. History was obtained from the patient, his wife and son-in-law present in the room. The patient reported that currently is a resident of assisted living facility, was going to his kitchen to get his morning medications when he felt dizzy and lightheaded. His home health aide sat him down and per his wife he was not responding for approximately 20 minutes. No facial drooping, slurred speech or seizure activity was noted. [name] reported that he has a history of orthostatic hypotension and usually blood pressure falls to 70s in the mornings. Dizziness is common for him and he had a previous fall 2 and half weeks ago. Patient follows [name] from neurology service who had recommended cutting back on Mirapex and if needed midodrine to be added in the morning. Patient denied any chest pain or shortness of breath or any palpitations prior to the event. Hospitalist service was recommended for observation. Initial EKG in the ED showed third-degree heart block, repeat EKG showed left bundle branch block which is chronic compared to previous EKGs, prolonged QTC 526.   Assessment/Plan: Principal Problem: Syncope: Likely due to autonomic dysfunction from Parkinson's disease, medication effect, recurrent orthostatic hypotension, patient also has prolonged QTC - orthostatic in the mornings per daughter, sometimes in 67's - serial troponins neg, cortisol 9.2, B12 1116, folate >20 - 2-D echocardiogram pending -I have decreased Mirapex to 1 mg TID (from 1.5mg  TID) as per neurology recommendations, I also discussed with Dr Jannifer Franklin, patient's neurologist, recommended no changes in  Sinemet dose. Maybe add a small dose of midodrine in future if he continues to have lower BP in the mornings. - cardiology following, appreciate recommendations - PT evaluation pending   Active Problems:  Orthostatic hypotension - Patient has a history of orthostatic hypotension, refer to #1    GERD (gastroesophageal reflux disease) - Continue Pepcid    Parkinson disease - Continue Sinemet, Mirapex   DVT prophylaxis: Lovenox   CODE STATUS: FULL CODE STATUS    Family Communication: Discussed in detail with the patient and his daughter, all imaging results, lab results explained to the patient in order   Disposition:  Consultants:  Cardiology  Discussed with neurology, [name] on the phone  Procedures:  CT head  Antibiotics:  None    Subjective: Patient seen and examined, feeling better this morning, no chest pain shortness of breath, fevers or chills, no significant dizziness  Objective: Weight change:   Intake/Output Summary (Last 24 hours) at 03/24/14 1259 Last data filed at 03/24/14 0900  Gross per 24 hour  Intake 1568.75 ml  Output   1075 ml  Net 493.75 ml   Blood pressure 172/67, pulse 60, temperature 98.1 F (36.7 C), temperature source Oral, resp. rate 18, height 5\' 10"  (1.778 m), weight 69.2 kg (152 lb 8.9 oz), SpO2 95 %.  Physical Exam: General: Alert and awake, oriented x3, not in any acute distress. CVS: S1-S2 clear, no murmur rubs or gallops Chest: clear to auscultation bilaterally, no wheezing, rales or rhonchi Abdomen: soft nontender, nondistended, normal bowel sounds  Extremities: no cyanosis, clubbing or edema noted bilaterally Neuro: Cranial nerves II-XII intact, no focal neurological deficits  Lab Results: Basic Metabolic  Panel:  Recent Labs Lab 03/23/14 1226 03/23/14 1241 03/23/14 1734 03/24/14 0524  NA 132* 134*  --  137  K 4.4 4.3  --  3.9  CL 97 95*  --  103  CO2 28  --   --  26  GLUCOSE 76 72  --  85  BUN 22  23  --  18  CREATININE 1.04 1.10 0.95 0.85  CALCIUM 8.9  --   --  8.8   Liver Function Tests: No results for input(s): AST, ALT, ALKPHOS, BILITOT, PROT, ALBUMIN in the last 168 hours. No results for input(s): LIPASE, AMYLASE in the last 168 hours. No results for input(s): AMMONIA in the last 168 hours. CBC:  Recent Labs Lab 03/23/14 1734 03/24/14 0524  WBC 5.7 5.1  HGB 12.1* 11.7*  HCT 37.0* 35.4*  MCV 91.4 90.5  PLT 236 224   Cardiac Enzymes:  Recent Labs Lab 03/23/14 1734 03/23/14 2302 03/24/14 0524  TROPONINI <0.03 <0.03 <0.03   BNP: Invalid input(s): POCBNP CBG:  Recent Labs Lab 03/23/14 1214  GLUCAP 82     Micro Results: No results found for this or any previous visit (from the past 240 hour(s)).  Studies/Results: Dg Chest 2 View  03/09/2014   CLINICAL DATA:  Cough for several weeks.  EXAM: CHEST  2 VIEW  COMPARISON:  November 19, 2011.  FINDINGS: The heart size and mediastinal contours are within normal limits. No pneumothorax or pleural effusion is noted. Stable interstitial densities are noted throughout both lungs concerning for scarring. No acute pulmonary disease is noted. 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  IMPRESSION: No acute cardiopulmonary abnormality seen.   Electronically Signed   By: Marijo Conception, M.D.   On: 03/09/2014 12:55   Ct Head Wo Contrast  03/23/2014   CLINICAL DATA:  Unresponsive episode  EXAM: CT HEAD WITHOUT CONTRAST  TECHNIQUE: Contiguous axial images were obtained from the base of the skull through the vertex without intravenous contrast.  COMPARISON:  03/09/2014  FINDINGS: Global atrophy. Chronic ischemic changes in the periventricular white matter and external capsules. No mass effect, midline shift, or acute hemorrhage. There is partial opacification of both maxillary sinuses and the sphenoid sinus. Fluid levels are present with mucous material. Mastoid air cells are clear. Cranium is  intact.  IMPRESSION: No acute intracranial pathology. Chronic ischemic changes and atrophy are noted.   Electronically Signed   By: Marybelle Killings M.D.   On: 03/23/2014 14:52   Ct Head Wo Contrast  03/09/2014   CLINICAL DATA:  Head trauma after falling off of the toilet. Right forehead scalp hematoma.  EXAM: CT HEAD WITHOUT CONTRAST  CT CERVICAL SPINE WITHOUT CONTRAST  TECHNIQUE: Multidetector CT imaging of the head and cervical spine was performed following the standard protocol without intravenous contrast. Multiplanar CT image reconstructions of the cervical spine were also generated.  COMPARISON:  None.  FINDINGS: CT HEAD FINDINGS  No mass lesion. No midline shift. No acute hemorrhage or hematoma. No extra-axial fluid collections. No evidence of acute infarction. There is diffuse cerebral cortical atrophy with secondary ventricular dilatation. Periventricular white matter lucency consistent with chronic small vessel ischemic disease. Old small lacunar infarct in the right basal ganglia. Small scalp hematoma over the right frontal bone. Osseous structures are intact. Mucosal thickening and small amount of fluid in both maxillary sinuses, not felt to be significant.  CT CERVICAL SPINE FINDINGS  There is no fracture, subluxation, or prevertebral soft tissue  swelling. Slight facet arthritis at C3-4 and C4-5 on the left and C3-4 on the right. Small broad-based disc bulge at C5-6. Congenital incomplete posterior arch of C1.  IMPRESSION: 1. No significant abnormality of the cervical spine. 2. No acute intracranial abnormality. 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 3. Right frontal scalp hematoma.   Electronically Signed   By: Lorriane Shire M.D.   On: 03/09/2014 12:35   Ct Cervical Spine Wo Contrast  03/09/2014   CLINICAL DATA:  Head trauma after falling off of the toilet. Right forehead scalp hematoma.  EXAM: CT HEAD WITHOUT CONTRAST  CT CERVICAL  SPINE WITHOUT CONTRAST  TECHNIQUE: Multidetector CT imaging of the head and cervical spine was performed following the standard protocol without intravenous contrast. Multiplanar CT image reconstructions of the cervical spine were also generated.  COMPARISON:  None.  FINDINGS: CT HEAD FINDINGS  No mass lesion. No midline shift. No acute hemorrhage or hematoma. No extra-axial fluid collections. No evidence of acute infarction. There is diffuse cerebral cortical atrophy with secondary ventricular dilatation. Periventricular white matter lucency consistent with chronic small vessel ischemic disease. Old small lacunar infarct in the right basal ganglia. Small scalp hematoma over the right frontal bone. Osseous structures are intact. Mucosal thickening and small amount of fluid in both maxillary sinuses, not felt to be significant.  CT CERVICAL SPINE FINDINGS  There is no fracture, subluxation, or prevertebral soft tissue swelling. Slight facet arthritis at C3-4 and C4-5 on the left and C3-4 on the right. Small broad-based disc bulge at C5-6. Congenital incomplete posterior arch of C1.  IMPRESSION: 1. No significant abnormality of the cervical spine. 2. No acute intracranial abnormality. 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 3. Right frontal scalp hematoma.   Electronically Signed   By: Lorriane Shire M.D.   On: 03/09/2014 12:35    Medications: Scheduled Meds: . acidophilus  1 capsule Oral QHS  . aspirin EC  81 mg Oral QHS  . carbidopa-levodopa  1 tablet Oral 3 times per day  . carbidopa-levodopa  1 tablet Oral 4 times per day  . cholecalciferol  400 Units Oral q morning - 10a  . cyanocobalamin  500 mcg Oral q morning - 10a  . docusate sodium  200 mg Oral QHS  . enoxaparin (LOVENOX) injection  40 mg Subcutaneous Q24H  . famotidine  20 mg Oral BID  . lubiprostone  24 mcg Oral BID WC  . multivitamin with minerals  1 tablet Oral q morning - 10a   . omega-3 acid ethyl esters  1 g Oral Q breakfast  . pramipexole  1 mg Oral 3 times per day  . selegiline  5 mg Oral 2 times per day  . sodium chloride  3 mL Intravenous Q12H   Time spent 25 minutes     RAI,RIPUDEEP M.D. [facility] 03/24/2014, 12:59 PM Pager: [phone]  If 7PM-7AM, please contact night-coverage [email or web address] Password [email or web address]

## 2014-03-24 NOTE — Telephone Encounter (Addendum)
Brian Aguirre is calling about her Father who was admitted to [facility] 3/14 as he had an episode and passed out. His body did not go limp but could not get him to respond.  The doctors don't think he had a stroke, they are checking his heart.  Dr. Tonia Brooms thinks that this might be a medication issue.  They think this might be caused by Carbidopa-levodopa 25-100 mg 1 tablet because of ortho-static hypertension and daughter is wondering if there is a problem with his Mirapex 1 mg. Please call daughter.  Thanks!

## 2014-03-24 NOTE — Evaluation (Signed)
Physical Therapy Evaluation Patient Details Name: Brian Aguirre MRN: [id number] DOB: 03-Nov-1929 Today's Date: 03/24/2014   History of Present Illness  81 yo male admitted with syncope. Hx of Parkinsons, falls, osteoporosis, orthostatic hypotension. Pt is from [facility] with aide during the day.  Clinical Impression  On eval, pt required Min assist +2 for safety for mobility-able to ambulate ~200 feet with RW. Pt tolerated activity well. C/o mild lightheadedness/dizziness initially with ambulation but pt was able to continue to progress activity. Family present during session-hopeful for pt to return to [place] apt with spouse and aide assisting during the day.     Follow Up Recommendations Supervision/Assistance - 24 hour;Home health PT (as long as family/aide can continue to provide care. If not, then may need to consider short stay in rehab prior to returning to apt.)    Equipment Recommendations  None recommended by PT    Recommendations for Other Services OT consult     Precautions / Restrictions Precautions Precautions: Fall Restrictions Weight Bearing Restrictions: No      Mobility  Bed Mobility               General bed mobility comments: pt oob in recliner  Transfers Overall transfer level: Needs assistance Equipment used: Rolling walker (2 wheeled) Transfers: Sit to/from Stand Sit to Stand: Min assist            Ambulation/Gait Ambulation/Gait assistance: Min assist;+2 safety/equipment Ambulation Distance (Feet): 200 Feet Assistive device: Rolling walker (2 wheeled) Gait Pattern/deviations: Step-through pattern;Trunk flexed;Decreased stride length     General Gait Details: +2 for safety/chair follow in case pt had a syncopal episode. Pt tolerated distance well. c/o "wooziness" initially.   Stairs            Wheelchair Mobility    Modified Rankin (Stroke Patients Only)       Balance Overall balance assessment: Needs assistance          Standing balance support: Bilateral upper extremity supported;During functional activity Standing balance-Leahy Scale: Poor                               Pertinent Vitals/Pain Pain Assessment: No/denies pain    Home Living   Living Arrangements: Spouse/significant other Available Help at Discharge: Family Type of Home: Independent living facility       Home Layout: One level Home Equipment: Environmental consultant - 2 wheels      Prior Function Level of Independence: Needs assistance   Gait / Transfers Assistance Needed: uses walker. walks to dining room            Hand Dominance        Extremity/Trunk Assessment   Upper Extremity Assessment: Defer to OT evaluation           Lower Extremity Assessment: Generalized weakness      Cervical / Trunk Assessment: Kyphotic  Communication   Communication: No difficulties  Cognition Arousal/Alertness: Awake/alert Behavior During Therapy: WFL for tasks assessed/performed Overall Cognitive Status: Within Functional Limits for tasks assessed                      General Comments      Exercises        Assessment/Plan    PT Assessment Patient needs continued PT services  PT Diagnosis Difficulty walking;Generalized weakness   PT Problem List Decreased mobility;Decreased activity tolerance;Decreased balance;Decreased strength  PT Treatment Interventions DME instruction;Gait  training;Functional mobility training;Therapeutic activities;Therapeutic exercise;Patient/family education;Balance training   PT Goals (Current goals can be found in the Care Plan section) Acute Rehab PT Goals Patient Stated Goal: home PT Goal Formulation: With patient/family Time For Goal Achievement: 04/07/14 Potential to Achieve Goals: Good    Frequency Min 3X/week   Barriers to discharge        Co-evaluation               End of Session Equipment Utilized During Treatment: Gait belt Activity Tolerance: Patient  tolerated treatment well Patient left: in chair;with call bell/phone within reach;with family/visitor present      Functional Assessment Tool Used: clinical judgement Functional Limitation: Mobility: Walking and moving around Mobility: Walking and Moving Around Current Status [phone]): At least 20 percent but less than 40 percent impaired, limited or restricted Mobility: Walking and Moving Around Goal Status [phone]): At least 1 percent but less than 20 percent impaired, limited or restricted    Time: 1115-1146 PT Time Calculation (min) (ACUTE ONLY): 31 min   Charges:   PT Evaluation $Initial PT Evaluation Tier I: 1 Procedure     PT G Codes:   PT G-Codes **NOT FOR INPATIENT CLASS** Functional Assessment Tool Used: clinical judgement Functional Limitation: Mobility: Walking and moving around Mobility: Walking and Moving Around Current Status ([id number]): At least 20 percent but less than 40 percent impaired, limited or restricted Mobility: Walking and Moving Around Goal Status [phone]): At least 1 percent but less than 20 percent impaired, limited or restricted    Weston Anna, MPT Pager: [phone]

## 2014-03-25 DIAGNOSIS — G909 Disorder of the autonomic nervous system, unspecified: Secondary | ICD-10-CM | POA: Diagnosis present

## 2014-03-25 DIAGNOSIS — I951 Orthostatic hypotension: Secondary | ICD-10-CM | POA: Diagnosis not present

## 2014-03-25 DIAGNOSIS — R55 Syncope and collapse: Secondary | ICD-10-CM | POA: Diagnosis not present

## 2014-03-25 DIAGNOSIS — G20 Parkinson's disease: Secondary | ICD-10-CM | POA: Diagnosis not present

## 2014-03-25 LAB — CBC
HCT: 38.9 % — ABNORMAL LOW (ref 39.0–52.0)
Hemoglobin: 12.9 g/dL — ABNORMAL LOW (ref 13.0–17.0)
MCH: 30.2 pg (ref 26.0–34.0)
MCHC: 33.2 g/dL (ref 30.0–36.0)
MCV: 91.1 fL (ref 78.0–100.0)
Platelets: 235 10*3/uL (ref 150–400)
RBC: 4.27 MIL/uL (ref 4.22–5.81)
RDW: 14.3 % (ref 11.5–15.5)
WBC: 6.5 10*3/uL (ref 4.0–10.5)

## 2014-03-25 LAB — BASIC METABOLIC PANEL
Anion gap: 9 (ref 5–15)
BUN: 14 mg/dL (ref 6–23)
CALCIUM: 8.9 mg/dL (ref 8.4–10.5)
CO2: 24 mmol/L (ref 19–32)
Chloride: 104 mmol/L (ref 96–112)
Creatinine, Ser: 0.8 mg/dL (ref 0.50–1.35)
GFR calc Af Amer: >90 mL/min (ref >90)
GFR, EST NON AFRICAN AMERICAN: 81 mL/min — AB (ref >90)
GLUCOSE: 84 mg/dL (ref 70–99)
POTASSIUM: 3.8 mmol/L (ref 3.5–5.1)
Sodium: 137 mmol/L (ref 135–145)

## 2014-03-25 MED ORDER — PRAMIPEXOLE DIHYDROCHLORIDE 1.5 MG PO TABS: 1.5000 mg | ORAL_TABLET | Freq: Three times a day (TID) | ORAL | Status: DC

## 2014-03-25 NOTE — Progress Notes (Signed)
Shift event note:  Notified by RN that when nurse tech entered room pt had his eyes open but was not responding. As he did respond his speech seemed to be slower than earlier in the evening and possibly slurred. She reported pt c/o tingling all over. RN questioned possible need for Ct. At bedside pt awake and oriented to self. He is able to follow simple commands though responses are somewhat slowed. He tracks and is able to answer simple questions.  I do not appreciate a facial droop. Pt moves all extremities x 4 with essentially equal strength. He can raise his arms w/o appreciable drift. CN II-XII appear grossly intact. Current VS, BP-183/90, T-98.2, P-73, R-18 w/ 02 sats of 99% on R/A.  Assessment/Plan: 1. Neurological changes:  Exam is completely non-focal. Feel pt's symptoms related to his PD though his initial lack of response sounds similar to the episode he experienced prior to presenting to ED. There was no syncope, seizure activity or LOC. BP slightly elevated but will monitor for now given pt's h/o orthstasis in am. Do not feel imaging is indicated at this time. Will continue to monitor closely on telemetry.   Jeryl Columbia, NP-C [facility] Pager [phone]

## 2014-03-25 NOTE — Progress Notes (Signed)
SUBJECTIVE:  Patient has had several episodes of nonresponsiveness in which he would not respond to verbal cues but would follow voice with eyes.  Also noted to have slurred speech.  Also complained of tingling all over.  BP at the time was 183/90.  Apparently this is similar to the episode he had in the ER.    OBJECTIVE:   Vitals:   Filed Vitals:   03/24/14 2315 03/24/14 2319 03/25/14 0037 03/25/14 0500  BP: 209/84 219/90 173/78 183/90  Pulse:    73  Temp:    98.2 F (36.8 C)  TempSrc:    Oral  Resp:    18  Height:      Weight:      SpO2:    99%   I&O's:   Intake/Output Summary (Last 24 hours) at 03/25/14 0920 Last data filed at 03/25/14 0600  Gross per 24 hour  Intake   2085 ml  Output   2900 ml  Net   -815 ml   TELEMETRY: Reviewed telemetry pt in NSR:     PHYSICAL EXAM General: Well developed, well nourished, in no acute distress Head: Eyes PERRLA, No xanthomas.   Normal cephalic and atramatic  Lungs:   Clear bilaterally to auscultation and percussion. Heart:   HRRR S1 S2 Pulses are 2+ & equal. Abdomen: Bowel sounds are positive, abdomen soft and non-tender without masses  Extremities:   No clubbing, cyanosis or edema.  DP +1 Neuro: appears lethargic and confused    LABS: Basic Metabolic Panel:  Recent Labs  03/24/14 0524 03/25/14 0451  NA 137 137  K 3.9 3.8  CL 103 104  CO2 26 24  GLUCOSE 85 84  BUN 18 14  CREATININE 0.85 0.80  CALCIUM 8.8 8.9   Liver Function Tests: No results for input(s): AST, ALT, ALKPHOS, BILITOT, PROT, ALBUMIN in the last 72 hours. No results for input(s): LIPASE, AMYLASE in the last 72 hours. CBC:  Recent Labs  03/24/14 0524 03/25/14 0451  WBC 5.1 6.5  HGB 11.7* 12.9*  HCT 35.4* 38.9*  MCV 90.5 91.1  PLT 224 235   Cardiac Enzymes:  Recent Labs  03/23/14 1734 03/23/14 2302 03/24/14 0524  TROPONINI <0.03 <0.03 <0.03   BNP: Invalid input(s): POCBNP D-Dimer: No results for input(s): DDIMER in the last 72  hours. Hemoglobin A1C: No results for input(s): HGBA1C in the last 72 hours. Fasting Lipid Panel: No results for input(s): CHOL, HDL, LDLCALC, TRIG, CHOLHDL, LDLDIRECT in the last 72 hours. Thyroid Function Tests:  Recent Labs  03/23/14 1724  TSH 1.043   Anemia Panel:  Recent Labs  03/23/14 1734  VITAMINB12 1116*  FOLATE >20.0   Coag Panel:   No results found for: INR, PROTIME  RADIOLOGY: Dg Chest 2 View  03/09/2014   CLINICAL DATA:  Cough for several weeks.  EXAM: CHEST  2 VIEW  COMPARISON:  November 19, 2011.  FINDINGS: The heart size and mediastinal contours are within normal limits. No pneumothorax or pleural effusion is noted. Stable interstitial densities are noted throughout both lungs concerning for scarring. No acute pulmonary disease is noted. 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  IMPRESSION: No acute cardiopulmonary abnormality seen.   Electronically Signed   By: Marijo Conception, M.D.   On: 03/09/2014 12:55   Ct Head Wo Contrast  03/23/2014   CLINICAL DATA:  Unresponsive episode  EXAM: CT HEAD WITHOUT CONTRAST  TECHNIQUE: Contiguous axial images were obtained from the  base of the skull through the vertex without intravenous contrast.  COMPARISON:  03/09/2014  FINDINGS: Global atrophy. Chronic ischemic changes in the periventricular white matter and external capsules. No mass effect, midline shift, or acute hemorrhage. There is partial opacification of both maxillary sinuses and the sphenoid sinus. Fluid levels are present with mucous material. Mastoid air cells are clear. Cranium is intact.  IMPRESSION: No acute intracranial pathology. Chronic ischemic changes and atrophy are noted.   Electronically Signed   By: Marybelle Killings M.D.   On: 03/23/2014 14:52   Ct Head Wo Contrast  03/09/2014   CLINICAL DATA:  Head trauma after falling off of the toilet. Right forehead scalp hematoma.  EXAM: CT HEAD WITHOUT CONTRAST  CT CERVICAL SPINE  WITHOUT CONTRAST  TECHNIQUE: Multidetector CT imaging of the head and cervical spine was performed following the standard protocol without intravenous contrast. Multiplanar CT image reconstructions of the cervical spine were also generated.  COMPARISON:  None.  FINDINGS: CT HEAD FINDINGS  No mass lesion. No midline shift. No acute hemorrhage or hematoma. No extra-axial fluid collections. No evidence of acute infarction. There is diffuse cerebral cortical atrophy with secondary ventricular dilatation. Periventricular white matter lucency consistent with chronic small vessel ischemic disease. Old small lacunar infarct in the right basal ganglia. Small scalp hematoma over the right frontal bone. Osseous structures are intact. Mucosal thickening and small amount of fluid in both maxillary sinuses, not felt to be significant.  CT CERVICAL SPINE FINDINGS  There is no fracture, subluxation, or prevertebral soft tissue swelling. Slight facet arthritis at C3-4 and C4-5 on the left and C3-4 on the right. Small broad-based disc bulge at C5-6. Congenital incomplete posterior arch of C1.  IMPRESSION: 1. No significant abnormality of the cervical spine. 2. No acute intracranial abnormality. 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 3. Right frontal scalp hematoma.   Electronically Signed   By: Lorriane Shire M.D.   On: 03/09/2014 12:35   Ct Cervical Spine Wo Contrast  03/09/2014   CLINICAL DATA:  Head trauma after falling off of the toilet. Right forehead scalp hematoma.  EXAM: CT HEAD WITHOUT CONTRAST  CT CERVICAL SPINE WITHOUT CONTRAST  TECHNIQUE: Multidetector CT imaging of the head and cervical spine was performed following the standard protocol without intravenous contrast. Multiplanar CT image reconstructions of the cervical spine were also generated.  COMPARISON:  None.  FINDINGS: CT HEAD FINDINGS  No mass lesion. No midline shift. No acute hemorrhage or  hematoma. No extra-axial fluid collections. No evidence of acute infarction. There is diffuse cerebral cortical atrophy with secondary ventricular dilatation. Periventricular white matter lucency consistent with chronic small vessel ischemic disease. Old small lacunar infarct in the right basal ganglia. Small scalp hematoma over the right frontal bone. Osseous structures are intact. Mucosal thickening and small amount of fluid in both maxillary sinuses, not felt to be significant.  CT CERVICAL SPINE FINDINGS  There is no fracture, subluxation, or prevertebral soft tissue swelling. Slight facet arthritis at C3-4 and C4-5 on the left and C3-4 on the right. Small broad-based disc bulge at C5-6. Congenital incomplete posterior arch of C1.  IMPRESSION: 1. No significant abnormality of the cervical spine. 2. No acute intracranial abnormality. 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 3. Right frontal scalp hematoma.   Electronically Signed   By: Lorriane Shire M.D.   On: 03/09/2014 12:35   `ASSESSMENT AND  PLAN  Principal Problem:  Syncope Active Problems:  Orthostatic hypotension  GERD (gastroesophageal reflux disease)  Parkinson disease   1. Syncope: CT of head negative. Physical exam notable for faint 1/6 systolic murmur at RUSB. No carotid bruits. Syncope likely secondary to orthostatic hypotension. Orthostatics vital signs were positive with SBP dropping from 190 to 97 with standing. Still mildly orthostatic yesterday but not since then.  Had another episode of non responsiveness but BP was actually high. Cardiac enzymes negative x 3. Monitor on telemetry for cardiac arrhthymias/ pauses. 2D echo showed normal LVF with moderate AI.   2. Orthostatic hypotension: Frequent recurrence. Would recommend ted hose stockings and consider abdominal binder. Would avoid midodrine since he has a tendency towards hypertension unless he continues to  have orthostatic hypotension with compression hose. 3. Prolonged QTc: continue to monitor closely. Avoid QT prolonging drugs. 4.  Parkinson's disease - per IM    Sueanne Margarita, MD  03/25/2014  9:20 AM

## 2014-03-25 NOTE — Progress Notes (Signed)
Called in the room by NT, because pt  not responding to verbal cues. When assessed patient not responding verbally, but followed voice with eyes. After a few minutes patient started to respond verbally but with worsened slurred speech. Neuro checks perfomed. Patient able to follow commands appropriately.  Patient c/o tingling all over. Patient able to tell me his name and birth date, city, but could not tell me the name of the facility that he is from.   BP 183/90, 98.2, HR 73, Resp 18, Oxygen 99% RA. NP on call paged. NP arrived to bedside to assess patient. No new orders placed. Patient now resting quietly. Will continue to monitor closely.

## 2014-03-25 NOTE — Discharge Summary (Addendum)
Physician Discharge Summary  Brian Aguirre BZJ:[id number] DOB: 1929/05/22 DOA: 03/23/2014  PCP: Mathews Argyle, MD  Admit date: 03/23/2014 Discharge date: 03/25/2014  Time spent: 30 minutes  Recommendations for Outpatient Follow-up:  1. Follow-up with neurology in 2-4 weeks to evaluate orthostatic hypotension.  Discharge Diagnoses:  Principal Problem:   Autonomic dysfunction Active Problems:   Orthostatic hypotension   GERD (gastroesophageal reflux disease)   Orthostatic syncope   Parkinson disease   Discharge Condition: stable  Diet recommendation: regular  Filed Weights   03/23/14 1634  Weight: 69.2 kg (152 lb 8.9 oz)    History of present illness:  81 year old male with history of Parkinson's disease, gait disorder, orthostatic hypotension usually noted in the mornings presented to ED with syncopal episode today. History was obtained from the patient, his wife and son-in-law present in the room. The patient reported that currently is a resident of assisted living facility, was going to his kitchen to get his morning medications when he felt dizzy and lightheaded. His home health aide sat him down and per his wife he was not responding for approximately 20 minutes. No facial drooping, slurred speech or seizure activity was noted. [name] reported that he has a history of orthostatic hypotension and usually blood pressure falls to 70s in the mornings. Dizziness is common for him and he had a previous fall 2 and half weeks ago. Patient follows [name] from neurology service who had recommended cutting back on Mirapex and if needed midodrine to be added in the morning. Patient denied any chest pain or shortness of breath or any palpitations prior to the event.  Hospital Course:  Recurrent orthostatic syncope due to autonomic dysfunction: - Likely due to orthostatic hypotension question autonomic dysfunction from Parkinson disease. Frequent and recurrent. - He is having  recurrent orthostatic episodes with a prolonged QTC cardiology was consulted, recommended a 2-D echo showed an EF of 55% with grade 1 diastolic heart failure no aortic stenosis. - Advised TED hose, cardiology recommended to avoid midodrine. Allow some degree of permissive hypertension due to high recurrence of orthostatic hypotension. - No events on telemetry.  Parkinson disease - Continue Sinemet at current dose and Mirapex at a higher dose.  GERD (gastroesophageal reflux disease) - Continue Pepcid.  Procedures:  Echo: EF 01% grade 1 diastolic heart fialure  Consultations:  cardiology  Discharge Exam: Filed Vitals:   03/25/14 0500  BP: 183/90  Pulse: 73  Temp: 98.2 F (36.8 C)  Resp: 18    General: A&O x3 Cardiovascular: RRR Respiratory: good air movement CTA B/L  Discharge Instructions   Discharge Instructions    Diet - low sodium heart healthy    Complete by:  As directed      Increase activity slowly    Complete by:  As directed           Current Discharge Medication List    CONTINUE these medications which have CHANGED   Details  pramipexole (MIRAPEX) 1.5 MG tablet Take 1 tablet (1.5 mg total) by mouth 3 (three) times daily. Taken at 6 am , 2 pm and bedtime Qty: 60 tablet, Refills: 0      CONTINUE these medications which have NOT CHANGED   Details  acetaminophen (TYLENOL) 500 MG tablet Take 1,000 mg by mouth every 6 (six) hours as needed for mild pain.    aspirin EC 81 MG tablet Take 81 mg by mouth at bedtime.    carbidopa-levodopa (SINEMET IR) 25-100 MG per tablet Take  by mouth 3 (three) times daily. 9am, 2pm,6:30pm.    carbidopa-levodopa (SINEMET IR) 25-250 MG per tablet Take 1 tablet by mouth. Four times a day at 6am,1130am, 4pm, and bedtime10:30pm.    docusate sodium (COLACE) 100 MG capsule Take 200 mg by mouth at bedtime.     fish oil-omega-3 fatty acids 1000 MG capsule Take 1 g by mouth daily with breakfast.     ibuprofen (ADVIL,MOTRIN) 200  MG tablet Take 400 mg by mouth every 6 (six) hours as needed (pain).    lubiprostone (AMITIZA) 24 MCG capsule Take 1 capsule (24 mcg total) by mouth 2 (two) times daily with a meal. Qty: 60 capsule, Refills: 3    Multiple Vitamin (MULTIVITAMIN WITH MINERALS) TABS Take 1 tablet by mouth every morning.     polyethylene glycol (MIRALAX / GLYCOLAX) packet Take 17 g by mouth daily as needed (constipation).    Probiotic Product (ALIGN) 4 MG CAPS Take 1 capsule by mouth at bedtime.    ranitidine (ZANTAC) 75 MG tablet Take 75 mg by mouth daily as needed for heartburn (reflux).     selegiline (ELDEPRYL) 5 MG tablet Take 5 mg by mouth 2 (two) times daily with a meal. Takes with breakfast and lunch.    triamcinolone (NASACORT AQ) 55 MCG/ACT AERO nasal inhaler Place 2 sprays into the nose daily as needed (for allergies).     vitamin B-12 (CYANOCOBALAMIN) 500 MCG tablet Take 500 mcg by mouth every morning.     vitamin D, CHOLECALCIFEROL, 400 UNITS tablet Take 400 Units by mouth every morning.       Allergies  Allergen Reactions  . Myrbetriq [Mirabegron] Nausea And Vomiting  . Stalevo [Carbidopa-Levodopa-Entacapone] Diarrhea    GI upset      The results of significant diagnostics from this hospitalization (including imaging, microbiology, ancillary and laboratory) are listed below for reference.    Significant Diagnostic Studies: Dg Chest 2 View  03/09/2014   CLINICAL DATA:  Cough for several weeks.  EXAM: CHEST  2 VIEW  COMPARISON:  November 19, 2011.  FINDINGS: The heart size and mediastinal contours are within normal limits. No pneumothorax or pleural effusion is noted. Stable interstitial densities are noted throughout both lungs concerning for scarring. No acute pulmonary disease is noted. 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  IMPRESSION: No acute cardiopulmonary abnormality seen.   Electronically Signed   By: Marijo Conception, M.D.   On: 03/09/2014  12:55   Ct Head Wo Contrast  03/23/2014   CLINICAL DATA:  Unresponsive episode  EXAM: CT HEAD WITHOUT CONTRAST  TECHNIQUE: Contiguous axial images were obtained from the base of the skull through the vertex without intravenous contrast.  COMPARISON:  03/09/2014  FINDINGS: Global atrophy. Chronic ischemic changes in the periventricular white matter and external capsules. No mass effect, midline shift, or acute hemorrhage. There is partial opacification of both maxillary sinuses and the sphenoid sinus. Fluid levels are present with mucous material. Mastoid air cells are clear. Cranium is intact.  IMPRESSION: No acute intracranial pathology. Chronic ischemic changes and atrophy are noted.   Electronically Signed   By: Marybelle Killings M.D.   On: 03/23/2014 14:52   Ct Head Wo Contrast  03/09/2014   CLINICAL DATA:  Head trauma after falling off of the toilet. Right forehead scalp hematoma.  EXAM: CT HEAD WITHOUT CONTRAST  CT CERVICAL SPINE WITHOUT CONTRAST  TECHNIQUE: Multidetector CT imaging of the head and cervical spine was performed following the  standard protocol without intravenous contrast. Multiplanar CT image reconstructions of the cervical spine were also generated.  COMPARISON:  None.  FINDINGS: CT HEAD FINDINGS  No mass lesion. No midline shift. No acute hemorrhage or hematoma. No extra-axial fluid collections. No evidence of acute infarction. There is diffuse cerebral cortical atrophy with secondary ventricular dilatation. Periventricular white matter lucency consistent with chronic small vessel ischemic disease. Old small lacunar infarct in the right basal ganglia. Small scalp hematoma over the right frontal bone. Osseous structures are intact. Mucosal thickening and small amount of fluid in both maxillary sinuses, not felt to be significant.  CT CERVICAL SPINE FINDINGS  There is no fracture, subluxation, or prevertebral soft tissue swelling. Slight facet arthritis at C3-4 and C4-5 on the left and C3-4 on  the right. Small broad-based disc bulge at C5-6. Congenital incomplete posterior arch of C1.  IMPRESSION: 1. No significant abnormality of the cervical spine. 2. No acute intracranial abnormality. 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 3. Right frontal scalp hematoma.   Electronically Signed   By: Lorriane Shire M.D.   On: 03/09/2014 12:35   Ct Cervical Spine Wo Contrast  03/09/2014   CLINICAL DATA:  Head trauma after falling off of the toilet. Right forehead scalp hematoma.  EXAM: CT HEAD WITHOUT CONTRAST  CT CERVICAL SPINE WITHOUT CONTRAST  TECHNIQUE: Multidetector CT imaging of the head and cervical spine was performed following the standard protocol without intravenous contrast. Multiplanar CT image reconstructions of the cervical spine were also generated.  COMPARISON:  None.  FINDINGS: CT HEAD FINDINGS  No mass lesion. No midline shift. No acute hemorrhage or hematoma. No extra-axial fluid collections. No evidence of acute infarction. There is diffuse cerebral cortical atrophy with secondary ventricular dilatation. Periventricular white matter lucency consistent with chronic small vessel ischemic disease. Old small lacunar infarct in the right basal ganglia. Small scalp hematoma over the right frontal bone. Osseous structures are intact. Mucosal thickening and small amount of fluid in both maxillary sinuses, not felt to be significant.  CT CERVICAL SPINE FINDINGS  There is no fracture, subluxation, or prevertebral soft tissue swelling. Slight facet arthritis at C3-4 and C4-5 on the left and C3-4 on the right. Small broad-based disc bulge at C5-6. Congenital incomplete posterior arch of C1.  IMPRESSION: 1. No significant abnormality of the cervical spine. 2. No acute intracranial abnormality. 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 3. Right frontal scalp hematoma.   Electronically  Signed   By: Lorriane Shire M.D.   On: 03/09/2014 12:35    Microbiology: Recent Results (from the past 240 hour(s))  Urine culture     Status: None   Collection Time: 03/23/14  7:46 PM  Result Value Ref Range Status   Specimen Description URINE, RANDOM  Final   Special Requests NONE  Final   Colony Count   Final    70,000 COLONIES/ML Performed at [facility]    Culture   Final    Multiple bacterial morphotypes present, none predominant. Suggest appropriate recollection if clinically indicated. Performed at [facility]    Report Status 03/24/2014 FINAL  Final     Labs: Basic Metabolic Panel:  Recent Labs Lab 03/23/14 1226 03/23/14 1241 03/23/14 1734 03/24/14 0524 03/25/14 0451  NA 132* 134*  --  137 137  K 4.4 4.3  --  3.9 3.8  CL 97 95*  --  103 104  CO2 28  --   --  26 24  GLUCOSE 76 72  --  85 84  BUN 22 23  --  18 14  CREATININE 1.04 1.10 0.95 0.85 0.80  CALCIUM 8.9  --   --  8.8 8.9   Liver Function Tests: No results for input(s): AST, ALT, ALKPHOS, BILITOT, PROT, ALBUMIN in the last 168 hours. No results for input(s): LIPASE, AMYLASE in the last 168 hours. No results for input(s): AMMONIA in the last 168 hours. CBC:  Recent Labs Lab 03/23/14 1226 03/23/14 1241 03/23/14 1734 03/24/14 0524 03/25/14 0451  WBC 8.1  --  5.7 5.1 6.5  HGB 12.6* 13.6 12.1* 11.7* 12.9*  HCT 38.1* 40.0 37.0* 35.4* 38.9*  MCV 91.6  --  91.4 90.5 91.1  PLT 227  --  236 224 235   Cardiac Enzymes:  Recent Labs Lab 03/23/14 1734 03/23/14 2302 03/24/14 0524  TROPONINI <0.03 <0.03 <0.03   BNP: BNP (last 3 results) No results for input(s): BNP in the last 8760 hours.  ProBNP (last 3 results) No results for input(s): PROBNP in the last 8760 hours.  CBG:  Recent Labs Lab 03/23/14 1214  GLUCAP 82       Signed:  FELIZ ORTIZ, ABRAHAM  [facility] 03/25/2014, 10:18 AM

## 2014-03-26 ENCOUNTER — Telehealth: Payer: Self-pay | Admitting: Neurology

## 2014-03-26 DIAGNOSIS — I951 Orthostatic hypotension: Secondary | ICD-10-CM | POA: Diagnosis not present

## 2014-03-26 DIAGNOSIS — G20 Parkinson's disease: Secondary | ICD-10-CM | POA: Diagnosis not present

## 2014-03-26 DIAGNOSIS — I10 Essential (primary) hypertension: Secondary | ICD-10-CM | POA: Diagnosis not present

## 2014-03-26 DIAGNOSIS — M6281 Muscle weakness (generalized): Secondary | ICD-10-CM | POA: Diagnosis not present

## 2014-03-26 NOTE — Telephone Encounter (Signed)
I called the daughter. The patient is having some episodes lowering of blood pressure. The pulse has gotten down into the upper 40s, this will need to be monitored, but this is a frequent event, he may need a cardiology evaluation. The patient was not wearing the abdominal binders, if the blood pressure continues to drop with these measures, we may need to add midodrine in the morning. The Mirapex dose was reduced to 1 mg 3 times daily.

## 2014-03-26 NOTE — Telephone Encounter (Signed)
Daughter is calling with concern about pt's blood pressure dropping.  Pt was released from hospital yesterday.  Last night his blood pressure dropped to 75/41 and pulse was 48, they gave pt Gatorade and blood pressure went up to 110/59 and pulse was 61.  Before he went to bed his blood pressure was 152/78. She is concerned and wondering if pt may need medication adjustment.  Please call and advise.

## 2014-03-27 DIAGNOSIS — I951 Orthostatic hypotension: Secondary | ICD-10-CM | POA: Diagnosis not present

## 2014-03-27 DIAGNOSIS — G20 Parkinson's disease: Secondary | ICD-10-CM | POA: Diagnosis not present

## 2014-03-30 ENCOUNTER — Telehealth: Payer: Self-pay | Admitting: Neurology

## 2014-03-30 DIAGNOSIS — G20 Parkinson's disease: Secondary | ICD-10-CM | POA: Diagnosis not present

## 2014-03-30 DIAGNOSIS — I951 Orthostatic hypotension: Secondary | ICD-10-CM | POA: Diagnosis not present

## 2014-03-30 NOTE — Telephone Encounter (Signed)
Letter signed and faxed to [facility]. Confirmation received.

## 2014-03-30 NOTE — Telephone Encounter (Signed)
Patient's daughter is calling in regard to having you write letter to patient's long term care insurance company. He has and needs to use his alternate care benefit. Insurance company is [employer] at phone [phone] and fax [phone]. Policy # is [id number]. Please call when letter will fax and they are having a problem with insurance company saying they did not receive documents. If possible please mail copy of letter to daughter Kathrynn Speed, [address], [place], [place] [address]. Please call daughter.

## 2014-03-31 DIAGNOSIS — I951 Orthostatic hypotension: Secondary | ICD-10-CM | POA: Diagnosis not present

## 2014-03-31 DIAGNOSIS — G20 Parkinson's disease: Secondary | ICD-10-CM | POA: Diagnosis not present

## 2014-03-31 NOTE — Telephone Encounter (Signed)
Confirmed with [name] from [facility] that they did receive the letter, as Brian Aguirre requested. Also, mailed a copy of the letter to [name] (release of information 10/15), as she requested. I called Brian Aguirre and left her a message relaying all of this information.

## 2014-04-01 DIAGNOSIS — I951 Orthostatic hypotension: Secondary | ICD-10-CM | POA: Diagnosis not present

## 2014-04-01 DIAGNOSIS — G20 Parkinson's disease: Secondary | ICD-10-CM | POA: Diagnosis not present

## 2014-04-02 ENCOUNTER — Ambulatory Visit (INDEPENDENT_AMBULATORY_CARE_PROVIDER_SITE_OTHER): Payer: Medicare Other | Admitting: Neurology

## 2014-04-02 ENCOUNTER — Encounter: Payer: Self-pay | Admitting: Neurology

## 2014-04-02 VITALS — BP 111/56 | HR 65 | Ht 72.0 in | Wt 158.2 lb

## 2014-04-02 DIAGNOSIS — I951 Orthostatic hypotension: Secondary | ICD-10-CM | POA: Diagnosis not present

## 2014-04-02 DIAGNOSIS — R42 Dizziness and giddiness: Secondary | ICD-10-CM

## 2014-04-02 DIAGNOSIS — G20 Parkinson's disease: Secondary | ICD-10-CM

## 2014-04-02 DIAGNOSIS — R269 Unspecified abnormalities of gait and mobility: Secondary | ICD-10-CM

## 2014-04-02 NOTE — Progress Notes (Signed)
Reason for visit: Parkinson's disease  Brian Aguirre is an 81 y.o. male  History of present illness:  Brian Aguirre is an 81 year old right-handed white male with a history of Parkinson's disease associated with a gait disorder and orthostatic hypotension. The patient has recently been in the hospital with an episode of syncope associated with orthostasis. The patient also is having a lot of problems with excessive daytime drowsiness. He returns to the office today for reevaluation. He was just seen by his primary care physician yesterday, and midodrine was added in the morning, taking 2.5 mg. The patient has TED hose, and an abdominal binder. The family has been placing the binder and TED hose on in the evening hours, and taking them off during the day. The patient has been dropped on the dose of the Mirapex taking 1 mg 3 times daily. The Sinemet dose has been left unchanged. The patient is still having documented blood pressures in the 70 systolic and below, and during the afternoon and evening, his blood pressures may go to the 150 or 160 range. The patient comes in the office today for an evaluation.  Past Medical History  Diagnosis Date  . Parkinson disease   . Osteoporosis   . Dizziness   . Depression   . Hypertension   . Gait disturbance   . Benign enlargement of prostate   . Orthostatic hypotension   . Fracture, rib   . Memory loss   . Chronic constipation 02/26/2014  . History of fall 03/09/2014  . Contusion of forehead 03/09/2014    Past Surgical History  Procedure Laterality Date  . Appendectomy    . Tonsillectomy    . Hernia repair    . Cataract extraction Left     Family History  Problem Relation Age of Onset  . Cancer Mother   . Heart failure Father     Social history:  reports that he has never smoked. He has never used smokeless tobacco. He reports that he does not drink alcohol or use illicit drugs.    Allergies  Allergen Reactions  . Myrbetriq  [Mirabegron] Nausea And Vomiting  . Stalevo [Carbidopa-Levodopa-Entacapone] Diarrhea    GI upset    Medications:  Prior to Admission medications   Medication Sig Start Date End Date Taking? Authorizing Provider  acetaminophen (TYLENOL) 500 MG tablet Take 1,000 mg by mouth every 6 (six) hours as needed for mild pain.   Yes Historical Provider, MD  aspirin EC 81 MG tablet Take 81 mg by mouth at bedtime.   Yes Historical Provider, MD  carbidopa-levodopa (SINEMET IR) 25-100 MG per tablet Take by mouth 3 (three) times daily. 9am, 2pm,6:30pm. 03/10/14  Yes Historical Provider, MD  carbidopa-levodopa (SINEMET IR) 25-250 MG per tablet Take 1 tablet by mouth. Four times a day at 6am,1130am, 4pm, and bedtime10:30pm.   Yes Historical Provider, MD  docusate sodium (COLACE) 100 MG capsule Take 200 mg by mouth at bedtime.    Yes Historical Provider, MD  fish oil-omega-3 fatty acids 1000 MG capsule Take 1 g by mouth daily with breakfast.    Yes Historical Provider, MD  ibuprofen (ADVIL,MOTRIN) 200 MG tablet Take 400 mg by mouth every 6 (six) hours as needed (pain).   Yes Historical Provider, MD  lubiprostone (AMITIZA) 24 MCG capsule Take 1 capsule (24 mcg total) by mouth 2 (two) times daily with a meal. 02/20/14  Yes Kathrynn Ducking, MD  midodrine (PROAMATINE) 2.5 MG tablet Take 2.5 mg by  mouth daily. 04/01/14  Yes Historical Provider, MD  Multiple Vitamin (MULTIVITAMIN WITH MINERALS) TABS Take 1 tablet by mouth every morning.    Yes Historical Provider, MD  polyethylene glycol (MIRALAX / GLYCOLAX) packet Take 17 g by mouth daily as needed (constipation).   Yes Historical Provider, MD  pramipexole (MIRAPEX) 1 MG tablet Take 1 mg by mouth 3 (three) times daily.   Yes Historical Provider, MD  Probiotic Product (ALIGN) 4 MG CAPS Take 1 capsule by mouth at bedtime.   Yes Historical Provider, MD  ranitidine (ZANTAC) 75 MG tablet Take 75 mg by mouth daily as needed for heartburn (reflux).    Yes Historical Provider, MD   selegiline (ELDEPRYL) 5 MG tablet Take 5 mg by mouth 2 (two) times daily with a meal. Takes with breakfast and lunch.   Yes Historical Provider, MD  triamcinolone (NASACORT AQ) 55 MCG/ACT AERO nasal inhaler Place 2 sprays into the nose daily as needed (for allergies).    Yes Historical Provider, MD  vitamin B-12 (CYANOCOBALAMIN) 500 MCG tablet Take 500 mcg by mouth every morning.    Yes Historical Provider, MD  vitamin D, CHOLECALCIFEROL, 400 UNITS tablet Take 400 Units by mouth every morning.   Yes Historical Provider, MD    ROS:  Out of a complete 14 system review of symptoms, the patient complains only of the following symptoms, and all other reviewed systems are negative.  Fatigue Drooling Double vision, loss of vision Cough Leg swelling Constipation Daytime sleepiness, snoring Urgency Bruising easily Dizziness, speech difficulty, weakness, passing out, gait disorder  Blood pressure 111/56, pulse 65, height 6' (1.829 m), weight 158 lb 3.2 oz (71.759 kg).  Physical Exam  General: The patient is alert and cooperative at the time of the examination.  Skin: No significant peripheral edema is noted.   Neurologic Exam  Mental status: The patient is alert and oriented x 3 at the time of the examination. The patient has apparent normal recent and remote memory, with an apparently normal attention span and concentration ability.   Cranial nerves: Facial symmetry is present. Speech is normal, no aphasia or dysarthria is noted. Extraocular movements are full. Visual fields are full. Masking of the face is seen.  Motor: The patient has good strength in all 4 extremities.  Sensory examination: Soft touch sensation is symmetric on the face, arms, and legs.  Coordination: The patient has good finger-nose-finger and heel-to-shin bilaterally.  Gait and station: The patient requires assistance with standing from a seated position. Once up, he has a tendency to lean backwards. He is  able to walk with a walker, he has difficulty with turns, but he is able to walk with good stride on the straight and level. Tandem gait was not attempted.  Reflexes: Deep tendon reflexes are symmetric.   Assessment/Plan:  1. Parkinson's disease  2. Orthostatic hypotension  3. Gait disorder  The patient is having a lot of issues with orthostatic hypotension in the morning, he may become hypertensive in the afternoon or evening. The patient has been given a low dose of midodrine in the morning, this will be maintained. I have indicated that he needs a foam wedge for the bed, and he is to wear the TED hose and the abdominal binder during the day when he is up, not at nighttime. He will follow-up through this office in June 2016.  Jill Alexanders MD 04/02/2014 7:32 PM  [facility] [address] [place], [place] [address]  Phone  [phone] Fax [phone]

## 2014-04-02 NOTE — Patient Instructions (Signed)

## 2014-04-03 DIAGNOSIS — G20 Parkinson's disease: Secondary | ICD-10-CM | POA: Diagnosis not present

## 2014-04-03 DIAGNOSIS — I951 Orthostatic hypotension: Secondary | ICD-10-CM | POA: Diagnosis not present

## 2014-04-04 ENCOUNTER — Emergency Department (HOSPITAL_COMMUNITY)
Admission: EM | Admit: 2014-04-04 | Discharge: 2014-04-04 | Disposition: A | Discharge status: Home / Self Care | Payer: Medicare Other | Attending: Emergency Medicine | Admitting: Emergency Medicine

## 2014-04-04 ENCOUNTER — Encounter (HOSPITAL_COMMUNITY): Payer: Self-pay | Admitting: *Deleted

## 2014-04-04 DIAGNOSIS — F329 Major depressive disorder, single episode, unspecified: Secondary | ICD-10-CM | POA: Diagnosis not present

## 2014-04-04 DIAGNOSIS — Z79899 Other long term (current) drug therapy: Secondary | ICD-10-CM | POA: Insufficient documentation

## 2014-04-04 DIAGNOSIS — I159 Secondary hypertension, unspecified: Secondary | ICD-10-CM | POA: Diagnosis not present

## 2014-04-04 DIAGNOSIS — Z7982 Long term (current) use of aspirin: Secondary | ICD-10-CM | POA: Diagnosis not present

## 2014-04-04 DIAGNOSIS — Z9181 History of falling: Secondary | ICD-10-CM | POA: Insufficient documentation

## 2014-04-04 DIAGNOSIS — M810 Age-related osteoporosis without current pathological fracture: Secondary | ICD-10-CM | POA: Insufficient documentation

## 2014-04-04 DIAGNOSIS — R030 Elevated blood-pressure reading, without diagnosis of hypertension: Secondary | ICD-10-CM | POA: Diagnosis not present

## 2014-04-04 DIAGNOSIS — N400 Enlarged prostate without lower urinary tract symptoms: Secondary | ICD-10-CM | POA: Diagnosis not present

## 2014-04-04 DIAGNOSIS — G20 Parkinson's disease: Secondary | ICD-10-CM | POA: Diagnosis not present

## 2014-04-04 DIAGNOSIS — I10 Essential (primary) hypertension: Secondary | ICD-10-CM | POA: Diagnosis present

## 2014-04-04 DIAGNOSIS — Z8781 Personal history of (healed) traumatic fracture: Secondary | ICD-10-CM | POA: Insufficient documentation

## 2014-04-04 DIAGNOSIS — K5900 Constipation, unspecified: Secondary | ICD-10-CM | POA: Insufficient documentation

## 2014-04-04 DIAGNOSIS — Z87828 Personal history of other (healed) physical injury and trauma: Secondary | ICD-10-CM | POA: Diagnosis not present

## 2014-04-04 NOTE — ED Notes (Addendum)
Per EMS, pt reports his blood pressure was 213/107 at home. Pt's BP was 195/95 upon EMS arrival. Pt denies blurred vision, headache, or epistaxis. Pt has hx of high blood pressure, states he is normally ~170/75. Pt takes 2.5mg  midodrine to raise his blood pressure in the morning. Pt is from friend's home.

## 2014-04-04 NOTE — ED Notes (Signed)
Bed: WA23 Expected date:  Expected time:  Means of arrival:  Comments: EMS 

## 2014-04-04 NOTE — ED Provider Notes (Signed)
CSN: [id number]     Arrival date & time 04/04/14  1813 History   First MD Initiated Contact with Patient 04/04/14 1839     Chief Complaint  Patient presents with  . Hypertension     (Consider location/radiation/quality/duration/timing/severity/associated sxs/prior Treatment) HPI Comments: Patient with recent hospitalization for syncope presents with complaint of elevated blood pressure. Patient's blood pressure was found to be in the low 810F systolic at home tonight. Patient was asymptomatic during this time. Patient has been treated recently for hypotension in the mornings with compression stockings, abdominal binder, Midodrine. Patient denies stroke symptoms, headache, vision loss, chest pain, shortness of breath, urinary symptoms. Urged to come in by home health nurse.   Patient is a 81 y.o. male presenting with hypertension. The history is provided by the patient and medical records.  Hypertension Pertinent negatives include no chest pain, congestion, fever, headaches, nausea, neck pain, numbness, rash, vomiting or weakness.    Past Medical History  Diagnosis Date  . Parkinson disease   . Osteoporosis   . Dizziness   . Depression   . Hypertension   . Gait disturbance   . Benign enlargement of prostate   . Orthostatic hypotension   . Fracture, rib   . Memory loss   . Chronic constipation 02/26/2014  . History of fall 03/09/2014  . Contusion of forehead 03/09/2014   Past Surgical History  Procedure Laterality Date  . Appendectomy    . Tonsillectomy    . Hernia repair    . Cataract extraction Left    Family History  Problem Relation Age of Onset  . Cancer Mother   . Heart failure Father    History  Substance Use Topics  . Smoking status: Never Smoker   . Smokeless tobacco: Never Used  . Alcohol Use: No    Review of Systems  Constitutional: Negative for fever.  HENT: Negative for congestion, dental problem, rhinorrhea and sinus pressure.   Eyes: Negative for  photophobia, discharge, redness and visual disturbance.  Respiratory: Negative for shortness of breath.   Cardiovascular: Negative for chest pain.  Gastrointestinal: Negative for nausea and vomiting.  Musculoskeletal: Negative for gait problem, neck pain and neck stiffness.  Skin: Negative for rash.  Neurological: Negative for syncope, speech difficulty, weakness, light-headedness, numbness and headaches.  Psychiatric/Behavioral: Negative for confusion.      Allergies  Myrbetriq and Stalevo  Home Medications   Prior to Admission medications   Medication Sig Start Date End Date Taking? Authorizing Provider  acetaminophen (TYLENOL) 500 MG tablet Take 1,000 mg by mouth every 6 (six) hours as needed for mild pain.   Yes Historical Provider, MD  aspirin EC 81 MG tablet Take 81 mg by mouth at bedtime.   Yes Historical Provider, MD  carbidopa-levodopa (SINEMET IR) 25-100 MG per tablet Take by mouth 3 (three) times daily. 9am, 2pm,6:30pm. 03/10/14  Yes Historical Provider, MD  carbidopa-levodopa (SINEMET IR) 25-250 MG per tablet Take 1 tablet by mouth. Four times a day at 6am,1130am, 4pm, and bedtime10:30pm.   Yes Historical Provider, MD  docusate sodium (COLACE) 100 MG capsule Take 200 mg by mouth at bedtime.    Yes Historical Provider, MD  fish oil-omega-3 fatty acids 1000 MG capsule Take 1 g by mouth daily with breakfast.    Yes Historical Provider, MD  furosemide (LASIX) 20 MG tablet Take 20 mg by mouth daily.   Yes Historical Provider, MD  ibuprofen (ADVIL,MOTRIN) 200 MG tablet Take 400 mg by mouth every 6 (six)  hours as needed (pain).   Yes Historical Provider, MD  lubiprostone (AMITIZA) 24 MCG capsule Take 1 capsule (24 mcg total) by mouth 2 (two) times daily with a meal. 02/20/14  Yes Kathrynn Ducking, MD  midodrine (PROAMATINE) 2.5 MG tablet Take 2.5 mg by mouth daily. 04/01/14  Yes Historical Provider, MD  Multiple Vitamin (MULTIVITAMIN WITH MINERALS) TABS Take 1 tablet by mouth every  morning.    Yes Historical Provider, MD  Multiple Vitamins-Minerals (PRESERVISION AREDS 2 PO) Take 2 tablets by mouth 2 (two) times daily.   Yes Historical Provider, MD  polyethylene glycol (MIRALAX / GLYCOLAX) packet Take 17 g by mouth daily as needed.   Yes Historical Provider, MD  pramipexole (MIRAPEX) 1 MG tablet Take 1 mg by mouth 3 (three) times daily.   Yes Historical Provider, MD  ranitidine (ZANTAC) 75 MG tablet Take 75 mg by mouth daily as needed for heartburn (reflux).    Yes Historical Provider, MD  selegiline (ELDEPRYL) 5 MG tablet Take 5 mg by mouth 2 (two) times daily with a meal. Takes with breakfast and lunch.   Yes Historical Provider, MD  triamcinolone (NASACORT) 55 MCG/ACT AERO nasal inhaler Place 2 sprays into the nose as needed (allergies).   Yes Historical Provider, MD  vitamin B-12 (CYANOCOBALAMIN) 500 MCG tablet Take 500 mcg by mouth every morning.    Yes Historical Provider, MD  vitamin D, CHOLECALCIFEROL, 400 UNITS tablet Take 400 Units by mouth every morning.   Yes Historical Provider, MD   BP 183/77 mmHg  Pulse 65  Resp 17  SpO2 99%   Physical Exam  Constitutional: He is oriented to person, place, and time. He appears well-developed and well-nourished.  HENT:  Head: Normocephalic and atraumatic.  Right Ear: Tympanic membrane, external ear and ear canal normal.  Left Ear: Tympanic membrane, external ear and ear canal normal.  Nose: Nose normal.  Mouth/Throat: Uvula is midline, oropharynx is clear and moist and mucous membranes are normal.  Eyes: Conjunctivae, EOM and lids are normal. Pupils are equal, round, and reactive to light.  Neck: Normal range of motion. Neck supple.  Cardiovascular: Normal rate and regular rhythm.   Pulmonary/Chest: Effort normal and breath sounds normal.  Abdominal: Soft. There is no tenderness.  Musculoskeletal: Normal range of motion.       Cervical back: He exhibits normal range of motion, no tenderness and no bony tenderness.   Neurological: He is alert and oriented to person, place, and time. He has normal strength. No sensory deficit. Gait (baseline walks with walker, unchanged) normal. GCS eye subscore is 4. GCS verbal subscore is 5. GCS motor subscore is 6.  Mild tremor related to Parkinson's.  Skin: Skin is warm and dry.  Psychiatric: He has a normal mood and affect.  Nursing note and vitals reviewed.   ED Course  Procedures (including critical care time) Labs Review Labs Reviewed - No data to display  Imaging Review No results found.   EKG Interpretation None       Patient seen and examined. Discussed with Dr. Wilson Singer who will see.   Vital signs reviewed and are as follows: BP 183/77 mmHg  Pulse 65  Resp 17  SpO2 99%  Plan: d/c to home, patient to take midodrine if morning blood pressure is less than 120/80. He is to follow-up with his PCP regarding his blood pressures.  Patient counseled to return if they have weakness in their arms or legs, slurred speech, trouble walking or talking, confusion,  trouble with their balance, or if they have any other concerns. Also return with vision loss, chest pain, SOB. Patient/family verbalizes understanding and agrees with plan.    MDM   Final diagnoses:  Secondary hypertension, unspecified   Patients with hypotension/hypertension as described above. Blood pressure was elevated tonight without any signs of end organ damage. Baseine neurological exam with symptoms of Parkinson's disease. Do not feel that imaging is indicated at this time. No concern for ACS or heart failure. Discharge to home with plan as above.    Carlisle Cater, PA-C 04/04/14 2100  Virgel Manifold, MD 04/08/14 [phone]

## 2014-04-04 NOTE — Discharge Instructions (Signed)
To help prevent high blood pressure later in the day, only take midodrine if morning blood pressure is less than 120/80. Please follow-up with your primary care doctor in 2 days for recheck. Return to the emergency department if you pass out, develop chest pain develop weakness in her arms or legs, trouble talking or walking, chest pain or shortness of breath.

## 2014-04-06 ENCOUNTER — Telehealth: Payer: Self-pay | Admitting: Neurology

## 2014-04-06 DIAGNOSIS — G20 Parkinson's disease: Secondary | ICD-10-CM | POA: Diagnosis not present

## 2014-04-06 DIAGNOSIS — I951 Orthostatic hypotension: Secondary | ICD-10-CM | POA: Diagnosis not present

## 2014-04-06 NOTE — Telephone Encounter (Signed)
I called the daughter. The patient went to the hospital over the weekend with a bili elevated blood pressures.  We will develop a protocol for use of midodrine. The patient needs to get the TED hose on in the morning, get up out of bed. The blood pressure will be taking before 8 or 8:30 AM and a decision will be made to give it midodrine or not. If the systolic blood pressure is less than 105, the midodrine will be given. If it is 105 or greater, no midodrine. I would not give the midodrine after 8:30 in the morning. Afternoon, if the blood pressure is greater than 176 systolic, I would take off the TED hose. If the blood pressure becomes greater than 160 systolic, medical attention may be necessary. If it is lower than 220, and the patient is able to sit with the head elevated, TED hose off, as always the blood pressure is coming down over 2 hours, they can hold off on going to seek medical attention. His blood pressures are quite labile at this time.

## 2014-04-06 NOTE — Telephone Encounter (Signed)
Pt's daughter Eustaquio Maize is calling stating pt went to ER Saturday with BP 213/105.  The ER doctor suggested not to take the midodrine (PROAMATINE) 2.5 MG tablet if his BP was 120/80. His BP was very low yesterday, but now normal.  She has a lot of questions regarding his blood pressure.  Please call and advise.

## 2014-04-06 NOTE — Telephone Encounter (Signed)
I called Beth. She wants to speak to [name] regarding a protocol she can give her father's caregivers for what to do if his BP drops too low or gets too high. She stated that it is low in the mornings and high in the evenings. When his pressure gets too low or too high it usually corrects itself within 30 minutes to a hour with the techniques [name] taught the family. She is afraid they will be calling EMS three times a week unless they can determine a safe amount of time to wait and see if the pressure corrects itself. I am forwarding this to [name].

## 2014-04-07 DIAGNOSIS — J309 Allergic rhinitis, unspecified: Secondary | ICD-10-CM | POA: Diagnosis not present

## 2014-04-07 DIAGNOSIS — I951 Orthostatic hypotension: Secondary | ICD-10-CM | POA: Diagnosis not present

## 2014-04-07 DIAGNOSIS — I10 Essential (primary) hypertension: Secondary | ICD-10-CM | POA: Diagnosis not present

## 2014-04-10 DIAGNOSIS — G20 Parkinson's disease: Secondary | ICD-10-CM | POA: Diagnosis not present

## 2014-04-10 DIAGNOSIS — I951 Orthostatic hypotension: Secondary | ICD-10-CM | POA: Diagnosis not present

## 2014-04-13 DIAGNOSIS — G20 Parkinson's disease: Secondary | ICD-10-CM | POA: Diagnosis not present

## 2014-04-13 DIAGNOSIS — I951 Orthostatic hypotension: Secondary | ICD-10-CM | POA: Diagnosis not present

## 2014-04-16 DIAGNOSIS — G20 Parkinson's disease: Secondary | ICD-10-CM | POA: Diagnosis not present

## 2014-04-16 DIAGNOSIS — I951 Orthostatic hypotension: Secondary | ICD-10-CM | POA: Diagnosis not present

## 2014-04-17 DIAGNOSIS — I951 Orthostatic hypotension: Secondary | ICD-10-CM | POA: Diagnosis not present

## 2014-04-17 DIAGNOSIS — G20 Parkinson's disease: Secondary | ICD-10-CM | POA: Diagnosis not present

## 2014-04-18 DIAGNOSIS — G20 Parkinson's disease: Secondary | ICD-10-CM | POA: Diagnosis not present

## 2014-04-18 DIAGNOSIS — I951 Orthostatic hypotension: Secondary | ICD-10-CM | POA: Diagnosis not present

## 2014-04-20 DIAGNOSIS — G20 Parkinson's disease: Secondary | ICD-10-CM | POA: Diagnosis not present

## 2014-04-20 DIAGNOSIS — I951 Orthostatic hypotension: Secondary | ICD-10-CM | POA: Diagnosis not present

## 2014-04-21 DIAGNOSIS — I951 Orthostatic hypotension: Secondary | ICD-10-CM | POA: Diagnosis not present

## 2014-04-21 DIAGNOSIS — G20 Parkinson's disease: Secondary | ICD-10-CM | POA: Diagnosis not present

## 2014-04-23 DIAGNOSIS — I951 Orthostatic hypotension: Secondary | ICD-10-CM | POA: Diagnosis not present

## 2014-04-23 DIAGNOSIS — G20 Parkinson's disease: Secondary | ICD-10-CM | POA: Diagnosis not present

## 2014-04-27 DIAGNOSIS — I951 Orthostatic hypotension: Secondary | ICD-10-CM | POA: Diagnosis not present

## 2014-04-27 DIAGNOSIS — G20 Parkinson's disease: Secondary | ICD-10-CM | POA: Diagnosis not present

## 2014-04-28 DIAGNOSIS — I951 Orthostatic hypotension: Secondary | ICD-10-CM | POA: Diagnosis not present

## 2014-04-28 DIAGNOSIS — G20 Parkinson's disease: Secondary | ICD-10-CM | POA: Diagnosis not present

## 2014-04-30 DIAGNOSIS — H50612 Brown's sheath syndrome, left eye: Secondary | ICD-10-CM | POA: Diagnosis not present

## 2014-04-30 DIAGNOSIS — I951 Orthostatic hypotension: Secondary | ICD-10-CM | POA: Diagnosis not present

## 2014-04-30 DIAGNOSIS — H04123 Dry eye syndrome of bilateral lacrimal glands: Secondary | ICD-10-CM | POA: Diagnosis not present

## 2014-04-30 DIAGNOSIS — G20 Parkinson's disease: Secondary | ICD-10-CM | POA: Diagnosis not present

## 2014-04-30 DIAGNOSIS — H532 Diplopia: Secondary | ICD-10-CM | POA: Diagnosis not present

## 2014-05-04 ENCOUNTER — Telehealth: Payer: Self-pay | Admitting: Neurology

## 2014-05-04 MED ORDER — SELEGILINE HCL 5 MG PO TABS: 5.0000 mg | ORAL_TABLET | Freq: Two times a day (BID) | ORAL | Status: DC

## 2014-05-04 NOTE — Telephone Encounter (Signed)
Rx has been sent.  I called back to advise.  They are aware.  

## 2014-05-04 NOTE — Telephone Encounter (Signed)
Patient requesting refill for Rx selegiline (ELDEPRYL) 5 MG tablet.  Please forward to [employer].  Please call and advise.

## 2014-05-05 ENCOUNTER — Ambulatory Visit (INDEPENDENT_AMBULATORY_CARE_PROVIDER_SITE_OTHER): Payer: Medicare Other | Admitting: Neurology

## 2014-05-05 DIAGNOSIS — G4710 Hypersomnia, unspecified: Secondary | ICD-10-CM

## 2014-05-05 DIAGNOSIS — G20 Parkinson's disease: Secondary | ICD-10-CM

## 2014-05-05 DIAGNOSIS — Z9181 History of falling: Secondary | ICD-10-CM

## 2014-05-05 DIAGNOSIS — G4719 Other hypersomnia: Secondary | ICD-10-CM

## 2014-05-05 NOTE — Sleep Study (Signed)
Please see the scanned sleep study interpretation located in the Procedure tab within the Chart Review section. 

## 2014-05-06 DIAGNOSIS — G20 Parkinson's disease: Secondary | ICD-10-CM | POA: Diagnosis not present

## 2014-05-06 DIAGNOSIS — I951 Orthostatic hypotension: Secondary | ICD-10-CM | POA: Diagnosis not present

## 2014-05-07 DIAGNOSIS — L218 Other seborrheic dermatitis: Secondary | ICD-10-CM | POA: Diagnosis not present

## 2014-05-07 DIAGNOSIS — L570 Actinic keratosis: Secondary | ICD-10-CM | POA: Diagnosis not present

## 2014-05-07 DIAGNOSIS — D225 Melanocytic nevi of trunk: Secondary | ICD-10-CM | POA: Diagnosis not present

## 2014-05-07 DIAGNOSIS — Z85828 Personal history of other malignant neoplasm of skin: Secondary | ICD-10-CM | POA: Diagnosis not present

## 2014-05-08 DIAGNOSIS — G20 Parkinson's disease: Secondary | ICD-10-CM | POA: Diagnosis not present

## 2014-05-08 DIAGNOSIS — I951 Orthostatic hypotension: Secondary | ICD-10-CM | POA: Diagnosis not present

## 2014-05-11 DIAGNOSIS — I951 Orthostatic hypotension: Secondary | ICD-10-CM | POA: Diagnosis not present

## 2014-05-11 DIAGNOSIS — G20 Parkinson's disease: Secondary | ICD-10-CM | POA: Diagnosis not present

## 2014-05-15 DIAGNOSIS — G20 Parkinson's disease: Secondary | ICD-10-CM | POA: Diagnosis not present

## 2014-05-15 DIAGNOSIS — I951 Orthostatic hypotension: Secondary | ICD-10-CM | POA: Diagnosis not present

## 2014-05-18 ENCOUNTER — Telehealth: Payer: Self-pay | Admitting: Neurology

## 2014-05-18 NOTE — Telephone Encounter (Signed)
Patient called wanting to follow up on his sleep study test results. Please call and advise. Patient can be reached @ [phone]

## 2014-05-19 DIAGNOSIS — I951 Orthostatic hypotension: Secondary | ICD-10-CM | POA: Diagnosis not present

## 2014-05-19 DIAGNOSIS — G20 Parkinson's disease: Secondary | ICD-10-CM | POA: Diagnosis not present

## 2014-05-21 ENCOUNTER — Telehealth: Payer: Self-pay

## 2014-05-21 NOTE — Telephone Encounter (Signed)
Spoke with pt about his sleep study results and the mild sleep apnea that it showed and that Dr. Brett Fairy requested to see pt to discuss results. Made an appt with pt to come in 5/31.

## 2014-05-22 DIAGNOSIS — G20 Parkinson's disease: Secondary | ICD-10-CM | POA: Diagnosis not present

## 2014-05-22 DIAGNOSIS — I951 Orthostatic hypotension: Secondary | ICD-10-CM | POA: Diagnosis not present

## 2014-05-25 ENCOUNTER — Other Ambulatory Visit: Payer: Self-pay | Admitting: Neurology

## 2014-05-25 DIAGNOSIS — I951 Orthostatic hypotension: Secondary | ICD-10-CM | POA: Diagnosis not present

## 2014-05-25 DIAGNOSIS — R296 Repeated falls: Secondary | ICD-10-CM | POA: Diagnosis not present

## 2014-05-25 DIAGNOSIS — G20 Parkinson's disease: Secondary | ICD-10-CM | POA: Diagnosis not present

## 2014-05-25 DIAGNOSIS — I10 Essential (primary) hypertension: Secondary | ICD-10-CM | POA: Diagnosis not present

## 2014-05-29 DIAGNOSIS — G20 Parkinson's disease: Secondary | ICD-10-CM | POA: Diagnosis not present

## 2014-05-29 DIAGNOSIS — R296 Repeated falls: Secondary | ICD-10-CM | POA: Diagnosis not present

## 2014-06-02 DIAGNOSIS — R296 Repeated falls: Secondary | ICD-10-CM | POA: Diagnosis not present

## 2014-06-02 DIAGNOSIS — G20 Parkinson's disease: Secondary | ICD-10-CM | POA: Diagnosis not present

## 2014-06-05 DIAGNOSIS — R296 Repeated falls: Secondary | ICD-10-CM | POA: Diagnosis not present

## 2014-06-05 DIAGNOSIS — G20 Parkinson's disease: Secondary | ICD-10-CM | POA: Diagnosis not present

## 2014-06-09 ENCOUNTER — Institutional Professional Consult (permissible substitution): Payer: Self-pay | Admitting: Neurology

## 2014-06-09 DIAGNOSIS — R296 Repeated falls: Secondary | ICD-10-CM | POA: Diagnosis not present

## 2014-06-09 DIAGNOSIS — G20 Parkinson's disease: Secondary | ICD-10-CM | POA: Diagnosis not present

## 2014-06-11 DIAGNOSIS — G20 Parkinson's disease: Secondary | ICD-10-CM | POA: Diagnosis not present

## 2014-06-11 DIAGNOSIS — R296 Repeated falls: Secondary | ICD-10-CM | POA: Diagnosis not present

## 2014-06-12 ENCOUNTER — Other Ambulatory Visit: Payer: Self-pay | Admitting: Nurse Practitioner

## 2014-06-12 DIAGNOSIS — G909 Disorder of the autonomic nervous system, unspecified: Secondary | ICD-10-CM | POA: Diagnosis not present

## 2014-06-12 DIAGNOSIS — R42 Dizziness and giddiness: Secondary | ICD-10-CM | POA: Diagnosis not present

## 2014-06-12 DIAGNOSIS — R079 Chest pain, unspecified: Secondary | ICD-10-CM | POA: Diagnosis not present

## 2014-06-12 DIAGNOSIS — I951 Orthostatic hypotension: Secondary | ICD-10-CM | POA: Diagnosis not present

## 2014-06-15 DIAGNOSIS — R296 Repeated falls: Secondary | ICD-10-CM | POA: Diagnosis not present

## 2014-06-15 DIAGNOSIS — G20 Parkinson's disease: Secondary | ICD-10-CM | POA: Diagnosis not present

## 2014-06-16 ENCOUNTER — Other Ambulatory Visit: Payer: Self-pay | Admitting: Nurse Practitioner

## 2014-06-16 ENCOUNTER — Ambulatory Visit
Admission: RE | Admit: 2014-06-16 | Discharge: 2014-06-16 | Disposition: A | Discharge status: Home / Self Care | Payer: Medicare Other | Source: Ambulatory Visit | Attending: Nurse Practitioner | Admitting: Nurse Practitioner

## 2014-06-16 DIAGNOSIS — G909 Disorder of the autonomic nervous system, unspecified: Secondary | ICD-10-CM

## 2014-06-16 DIAGNOSIS — N281 Cyst of kidney, acquired: Secondary | ICD-10-CM | POA: Diagnosis not present

## 2014-06-16 DIAGNOSIS — R3915 Urgency of urination: Secondary | ICD-10-CM | POA: Diagnosis not present

## 2014-06-17 ENCOUNTER — Other Ambulatory Visit: Payer: Self-pay | Admitting: Neurology

## 2014-06-17 NOTE — Telephone Encounter (Signed)
Per OV notes 08/27

## 2014-06-19 ENCOUNTER — Other Ambulatory Visit: Payer: Self-pay | Admitting: Geriatric Medicine

## 2014-06-19 DIAGNOSIS — G909 Disorder of the autonomic nervous system, unspecified: Secondary | ICD-10-CM

## 2014-06-23 ENCOUNTER — Ambulatory Visit (INDEPENDENT_AMBULATORY_CARE_PROVIDER_SITE_OTHER): Payer: Medicare Other | Admitting: Neurology

## 2014-06-23 ENCOUNTER — Encounter: Payer: Self-pay | Admitting: Neurology

## 2014-06-23 VITALS — BP 156/73 | HR 61 | Ht 70.0 in | Wt 156.6 lb

## 2014-06-23 DIAGNOSIS — I951 Orthostatic hypotension: Secondary | ICD-10-CM | POA: Diagnosis not present

## 2014-06-23 DIAGNOSIS — R269 Unspecified abnormalities of gait and mobility: Secondary | ICD-10-CM | POA: Diagnosis not present

## 2014-06-23 DIAGNOSIS — G20 Parkinson's disease: Secondary | ICD-10-CM

## 2014-06-23 NOTE — Patient Instructions (Signed)
Parkinson Disease Parkinson disease is a disorder of the central nervous system, which includes the brain and spinal cord. A person with this disease slowly loses the ability to completely control body movements. Within the brain, there is a group of nerve cells (basal ganglia) that help control movement. The basal ganglia are damaged and do not work properly in a person with Parkinson disease. In addition, the basal ganglia produce and use a brain chemical called dopamine. The dopamine chemical sends messages to other parts of the body to control and coordinate body movements. Dopamine levels are low in a person with Parkinson disease. If the dopamine levels are low, then the body does not receive the correct messages it needs to move normally.  CAUSES  The exact reason why the basal ganglia get damaged is not known. Some medical researchers have thought that infection, genes, environment, and certain medicines may contribute to the cause.  SYMPTOMS   An early symptom of Parkinson disease is often an uncontrolled shaking (tremor) of the hands. The tremor will often disappear when the affected hand is consciously used.  As the disease progresses, walking, talking, getting out of a chair, and new movements become more difficult.  Muscles get stiff and movements become slower.  Balance and coordination become harder.  Depression, trouble swallowing, urinary problems, constipation, and sleep problems can occur.  Later in the disease, memory and thought processes may deteriorate. DIAGNOSIS  There are no specific tests to diagnose Parkinson disease. You may be referred to a neurologist for evaluation. Your caregiver will ask about your medical history, symptoms, and perform a physical exam. Blood tests and imaging tests of your brain may be performed to rule out other diseases. The imaging tests may include an MRI or a CT scan. TREATMENT  The goal of treatment is to relieve symptoms. Medicines may be  prescribed once the symptoms become troublesome. Medicine will not stop the progression of the disease, but medicine can make movement and balance better and help control tremors. Speech and occupational therapy may also be prescribed. Sometimes, surgical treatment of the brain can be done in young people. HOME CARE INSTRUCTIONS  Get regular exercise and rest periods during the day to help prevent exhaustion and depression.  If getting dressed becomes difficult, replace buttons and zippers with Velcro and elastic on your clothing.  Take all medicine as directed by your caregiver.  Install grab bars or railings in your home to prevent falls.  Go to speech or occupational therapy as directed.  Keep all follow-up visits as directed by your caregiver. SEEK MEDICAL CARE IF:  Your symptoms are not controlled with your medicine.  You fall.  You have trouble swallowing or choke on your food. MAKE SURE YOU:  Understand these instructions.  Will watch your condition.  Will get help right away if you are not doing well or get worse. Document Released: 12/24/1999 Document Revised: 04/22/2012 Document Reviewed: 01/25/2011 ExitCare Patient Information 2015 [employer]. This information is not intended to replace advice given to you by your health care provider. Make sure you discuss any questions you have with your health care provider.  

## 2014-06-23 NOTE — Progress Notes (Signed)
Reason for visit: Parkinson's disease  Brian Aguirre is an 81 y.o. male  History of present illness:  Brian Aguirre is an 81 year old right-handed white male with a history of Parkinson's disease associated with a gait disorder. He has had significant issues with autonomic dysfunction with constipation and orthostatic hypotension. He has quite labile blood pressures which may go from the 82X systolic to 937 systolic in the same day. The patient has not had any blackouts secondary to orthostasis. He has had one episode of falling out of a chair without injury several weeks ago. He reports episodes of fatigue that may occur for several days at a time, and then clear up. He has had a sleep study that shows mild sleep apnea. He may have a REM sleep disorder, he seems to be quite active at night during his sleep. He denies any choking problems with swallowing. He continues to do physical exercise on a regular basis. He returns for an evaluation. He remains on Sinemet, selegiline, and Mirapex.  Past Medical History  Diagnosis Date  . Parkinson disease   . Osteoporosis   . Dizziness   . Depression   . Hypertension   . Gait disturbance   . Benign enlargement of prostate   . Orthostatic hypotension   . Fracture, rib   . Memory loss   . Chronic constipation 02/26/2014  . History of fall 03/09/2014  . Contusion of forehead 03/09/2014    Past Surgical History  Procedure Laterality Date  . Appendectomy    . Tonsillectomy    . Hernia repair    . Cataract extraction Left     Family History  Problem Relation Age of Onset  . Cancer Mother   . Heart failure Father   . Breast cancer Daughter     Social history:  reports that he has never smoked. He has never used smokeless tobacco. He reports that he does not drink alcohol or use illicit drugs.    Allergies  Allergen Reactions  . Myrbetriq [Mirabegron] Nausea And Vomiting  . Stalevo [Carbidopa-Levodopa-Entacapone] Diarrhea    GI upset     Medications:  Prior to Admission medications   Medication Sig Start Date End Date Taking? Authorizing Provider  acetaminophen (TYLENOL) 500 MG tablet Take 1,000 mg by mouth every 6 (six) hours as needed for mild pain.   Yes Historical Provider, MD  aspirin EC 81 MG tablet Take 81 mg by mouth at bedtime.   Yes Historical Provider, MD  carbidopa-levodopa (SINEMET IR) 25-100 MG per tablet Take 1 tablet by mouth 3 (three) times daily. 06/17/14  Yes Kathrynn Ducking, MD  carbidopa-levodopa (SINEMET IR) 25-250 MG per tablet TAKE  (1)  TABLET  FOUR TIMES DAILY. 05/26/14  Yes Kathrynn Ducking, MD  docusate sodium (COLACE) 100 MG capsule Take 200 mg by mouth at bedtime.    Yes Historical Provider, MD  fish oil-omega-3 fatty acids 1000 MG capsule Take 1 g by mouth daily with breakfast.    Yes Historical Provider, MD  ibuprofen (ADVIL,MOTRIN) 200 MG tablet Take 400 mg by mouth every 6 (six) hours as needed (pain).   Yes Historical Provider, MD  lubiprostone (AMITIZA) 24 MCG capsule Take 1 capsule (24 mcg total) by mouth 2 (two) times daily with a meal. 02/20/14  Yes Kathrynn Ducking, MD  Multiple Vitamin (MULTIVITAMIN WITH MINERALS) TABS Take 1 tablet by mouth every morning.    Yes Historical Provider, MD  Multiple Vitamins-Minerals (PRESERVISION AREDS 2 PO)  Take 2 tablets by mouth 2 (two) times daily.   Yes Historical Provider, MD  pramipexole (MIRAPEX) 1 MG tablet Take 1 mg by mouth 3 (three) times daily.   Yes Historical Provider, MD  ranitidine (ZANTAC) 75 MG tablet Take 75 mg by mouth daily as needed for heartburn (reflux).    Yes Historical Provider, MD  selegiline (ELDEPRYL) 5 MG tablet Take 1 tablet (5 mg total) by mouth 2 (two) times daily. Takes at breakfast and lunch. 05/04/14  Yes Kathrynn Ducking, MD  triamcinolone (NASACORT) 55 MCG/ACT AERO nasal inhaler Place 2 sprays into the nose as needed (allergies).   Yes Historical Provider, MD  vitamin B-12 (CYANOCOBALAMIN) 500 MCG tablet Take 500 mcg by  mouth every morning.    Yes Historical Provider, MD  vitamin D, CHOLECALCIFEROL, 400 UNITS tablet Take 400 Units by mouth every morning.   Yes Historical Provider, MD    ROS:  Out of a complete 14 system review of symptoms, the patient complains only of the following symptoms, and all other reviewed systems are negative.  Appetite change, fatigue Double vision Cough Drooling Acid reflux Dizziness Weakness Constipation  Blood pressure 156/73, pulse 61, height 5\' 10"  (1.778 m), weight 156 lb 9.6 oz (71.033 kg).  Physical Exam  General: The patient is alert and cooperative at the time of the examination.  Skin: No significant peripheral edema is noted.   Neurologic Exam  Mental status: The patient is alert and oriented x 2 at the time of the examination (not oriented to date). The patient has apparent normal recent and remote memory, with an apparently normal attention span and concentration ability. Mini-Mental Status Examination done today shows a total score of 25/30. The patient is able to name 6 animals in 30 seconds.   Cranial nerves: Facial symmetry is present. Speech is normal, no aphasia or dysarthria is noted. Extraocular movements are full. Visual fields are full. Masking of the face is seen.  Motor: The patient has good strength in all 4 extremities.  Sensory examination: Soft touch sensation is symmetric on the face, arms, and legs.  Coordination: The patient has good finger-nose-finger and heel-to-shin bilaterally.  Gait and station: The patient requires assistance with standing. Initially, he tends to lean backwards. Eventually, he is able to ambulate with a walker, appears to have relatively good stride, turns are slow, with short steps. The patient does not fall. Tandem gait was not attempted.  Reflexes: Deep tendon reflexes are symmetric.   Assessment/Plan:  1. Parkinson's disease  2. Gait disorder  3. Orthostatic hypotension  4. Mild memory  disturbance  The patient is no longer on midodrine. He is managing blood pressures with the compression stockings, frequent blood pressure checks. The patient is gradually worsening with his Parkinson's disease. He does not have severe memory issues, it is possible he could be a candidate for a deep brain stimulator at some point. The patient may benefit from a lift chair in the future, he does not wish to get one at this point. He will follow-up in about 4 months.  Jill Alexanders MD 06/23/2014 7:23 PM  [facility] [address] [place], [place] [address]  Phone [phone] Fax [phone]

## 2014-06-24 ENCOUNTER — Ambulatory Visit (INDEPENDENT_AMBULATORY_CARE_PROVIDER_SITE_OTHER): Payer: Medicare Other | Admitting: Neurology

## 2014-06-24 ENCOUNTER — Telehealth: Payer: Self-pay

## 2014-06-24 ENCOUNTER — Encounter: Payer: Self-pay | Admitting: Neurology

## 2014-06-24 VITALS — BP 124/64 | HR 80 | Resp 20 | Ht 71.26 in | Wt 157.0 lb

## 2014-06-24 DIAGNOSIS — G20 Parkinson's disease: Secondary | ICD-10-CM | POA: Diagnosis not present

## 2014-06-24 DIAGNOSIS — R0683 Snoring: Secondary | ICD-10-CM

## 2014-06-24 MED ORDER — CARBIDOPA-LEVODOPA ER 48.75-195 MG PO CPCR: 48.5000 mg | ORAL_CAPSULE | ORAL | Status: DC

## 2014-06-24 NOTE — Progress Notes (Signed)
SLEEP MEDICINE CLINIC   Provider:  Larey Seat, M D  Referring Provider: Lajean Manes, MD Primary Care Physician:  Mathews Argyle, MD  Chief Complaint  Patient presents with  . Follow-up    sleep study results, return, with wife and caregiver    HPI:  Brian Aguirre is a 81 y.o. male seen here as a referral from [name] for a sleep consultation,   A Sleep study originated when Brian Aguirre saw his dentist,  who had noted  chewing marks on the inside of his buccal tissue and at  the lining of his lower lip.  Since the patient carries a diagnosis of Parkinson's disease it was also possible that a periodic limb movement or rhythmic chewing equivalent of PLM was present.  For this reason,  [name] then referred the patient for a sleep study. The sleep study revealed an AHI of 11.0 which is a very mild apnea. There was no REM sleep noted and the patient slept all night in supine position so a positional difference was not seen as well. The patient reports that at home he likes to sleep on his side but his wife noticed that he is rolling over during his sleep. Wife has also noted positional snoring only in supine sleep. She also reports that he likes to sleep with his head overextended almost arched neck. The patient is dysarthric and dysphonic as he reports that he has trouble rolling over at night and changing the sleep position. Distally the patient had some periodic limb movements but none let to arousals. He had irregular heartbeats irregular shaped R to R intervals. Suspected to reflect PACs or PVCs. I have to remind the reader that this is a single-channel EKG.  The AHI would be considered not high enough to need CPAP therapy . He already owns a bite guard and he has not felt any difference in breathing as long as he is on his side. If he sleeps and naps in the recliner , his his wife has noted him to chew on the bite guard and sometimes he does is at night as well with an  automatic movement of the lower jaw of repetitive closing and opening of the jaw. He has no history of facial tics or any takes, and he has a mild resting tremor when he is seen here today with a masked face and dysphonic voice.  The patient takes his first medicine at 6 AM but stay still in bed  for another hour or so.  He does not report freezing symptoms or and off dose phenomena. He also does not report any hyperkinesia. He will have constant movements in his legs at breakfast time.  His wife reports some freezing.    The following is a note from Dr. Floyde Parkins from 06-23-2014  Brian Aguirre is an 81 year old right-handed white male with a history of Parkinson's disease associated with a gait disorder. He has had significant issues with autonomic dysfunction with constipation and orthostatic hypotension. He has quite labile blood pressures which may go from the 14H systolic to 702 systolic in the same day. The patient has not had any blackouts secondary to orthostasis. He has had one episode of falling out of a chair without injury several weeks ago. He reports episodes of fatigue that may occur for several days at a time, and then clear up. He has had a sleep study that shows mild sleep apnea. He may have a REM sleep disorder,  he seems to be quite active at night during his sleep. He denies any choking problems with swallowing. He continues to do physical exercise on a regular basis. He returns for an evaluation. He remains on Sinemet, selegiline, and Mirapex.    Review of Systems: Out of a complete 14 system review, the patient complains of only the following symptoms, and all other reviewed systems are negative.   Epworth score 22 , Fatigue severity score 39   , geriatric depression score  5    History   Social History  . Marital Status: Married    Spouse Name: N/A  . Number of Children: 2  . Years of Education: College +   Occupational History  . Retired Theme park manager    Social History  Main Topics  . Smoking status: Never Smoker   . Smokeless tobacco: Never Used  . Alcohol Use: No  . Drug Use: No  . Sexual Activity: Not on file   Other Topics Concern  . Not on file   Social History Narrative   Patient is right handed.   Patient drinks 1/2 cup caffeine daily.    Family History  Problem Relation Age of Onset  . Cancer Mother   . Heart failure Father   . Breast cancer Daughter     Past Medical History  Diagnosis Date  . Parkinson disease   . Osteoporosis   . Dizziness   . Depression   . Hypertension   . Gait disturbance   . Benign enlargement of prostate   . Orthostatic hypotension   . Fracture, rib   . Memory loss   . Chronic constipation 02/26/2014  . History of fall 03/09/2014  . Contusion of forehead 03/09/2014    Past Surgical History  Procedure Laterality Date  . Appendectomy    . Tonsillectomy    . Hernia repair    . Cataract extraction Left     Current Outpatient Prescriptions  Medication Sig Dispense Refill  . acetaminophen (TYLENOL) 500 MG tablet Take 1,000 mg by mouth every 6 (six) hours as needed for mild pain.    Marland Kitchen aspirin EC 81 MG tablet Take 81 mg by mouth at bedtime.    . carbidopa-levodopa (SINEMET IR) 25-100 MG per tablet Take 1 tablet by mouth 3 (three) times daily. 90 tablet 1  . carbidopa-levodopa (SINEMET IR) 25-250 MG per tablet TAKE  (1)  TABLET  FOUR TIMES DAILY. 120 tablet 6  . docusate sodium (COLACE) 100 MG capsule Take 200 mg by mouth at bedtime.     . fish oil-omega-3 fatty acids 1000 MG capsule Take 1 g by mouth daily with breakfast.     . ibuprofen (ADVIL,MOTRIN) 200 MG tablet Take 400 mg by mouth every 6 (six) hours as needed (pain).    . lubiprostone (AMITIZA) 24 MCG capsule Take 1 capsule (24 mcg total) by mouth 2 (two) times daily with a meal. 60 capsule 3  . Multiple Vitamin (MULTIVITAMIN WITH MINERALS) TABS Take 1 tablet by mouth every morning.     . Multiple Vitamins-Minerals (PRESERVISION AREDS 2 PO) Take  2 tablets by mouth 2 (two) times daily.    . pramipexole (MIRAPEX) 1 MG tablet Take 1 mg by mouth 3 (three) times daily.    . ranitidine (ZANTAC) 75 MG tablet Take 75 mg by mouth daily as needed for heartburn (reflux).     . selegiline (ELDEPRYL) 5 MG tablet Take 1 tablet (5 mg total) by mouth 2 (two) times daily. Takes  at breakfast and lunch. 60 tablet 6  . triamcinolone (NASACORT) 55 MCG/ACT AERO nasal inhaler Place 2 sprays into the nose as needed (allergies).    . vitamin B-12 (CYANOCOBALAMIN) 500 MCG tablet Take 500 mcg by mouth every morning.     . vitamin D, CHOLECALCIFEROL, 400 UNITS tablet Take 400 Units by mouth every morning.     No current facility-administered medications for this visit.    Allergies as of 06/24/2014 - Review Complete 06/24/2014  Allergen Reaction Noted  . Myrbetriq [mirabegron] Nausea And Vomiting 09/13/2013  . Stalevo [carbidopa-levodopa-entacapone] Diarrhea 11/19/2011    Vitals: BP 124/64 mmHg  Pulse 80  Resp 20  Ht 5' 11.26" (1.81 m)  Wt 157 lb (71.215 kg)  BMI 21.74 kg/m2 Last Weight:  Wt Readings from Last 1 Encounters:  06/24/14 157 lb (71.215 kg)       Last Height:   Ht Readings from Last 1 Encounters:  06/24/14 5' 11.26" (1.81 m)    Physical exam:  General: The patient is awake, alert and appears not in acute distress. The patient is well groomed. Head: Normocephalic, atraumatic. Neck is supple. Mallampati 3   neck circumference: 15.5 . Nasal airflow unrestricted , TMJ is  evident , click on the right . Retrognathia is not seen.  Cardiovascular:  irregular rate and rhythm , without  murmurs or carotid bruit, and without distended neck veins. Respiratory: Lungs are clear to auscultation. Skin:  Without evidence of edema, or rash Trunk: BMI is not  elevated and patient  has a Hunched posture.  Neurologic exam : The patient is awake and alert, oriented to place and time.   Memory subjective described as intact.  There is a normal  attention span & concentration ability. Speech is fluent with dysphonia ,  Mood and affect are appropriate.  Cranial nerves: Pupils are equal and briskly reactive to light. Funduscopic exam without evidence of pallor or edema. Extraocular movements  in vertical and horizontal planes are not smooth , but  saccadic without nystagmus. Visual fields by finger perimetry are intact. Hearing to finger rub impaired .  Facial sensation intact to fine touch. Facial motor strength masked face.   Motor exam:    Sensory:  Fine touch, pinprick and vibration were tested in all extremities. Proprioception is normal.  Coordination: Rapid alternating movements in the fingers/hands is slowed - Finger-to-nose maneuver  With  tremor.  Gait and station:  The patient has to brace himself to leave the seated position. Further gait is deferred - Patient walks with a walker as assistive device , Deep tendon reflexes: in the  upper and lower extremities are symmetric and intact.   Assessment:  After physical and neurologic examination, review of laboratory studies, imaging, neurophysiology testing and pre-existing records, assessment is   1) Hypersomnia at Epworth 22-30 and fatigue at FSS 39. In a patient with Parkinson's disease, progressed. He takes Sinemet 25-54 times daily and 25 103 times daily. He may be a good candidate for Rytary.  2) snoring is positional dependent.  3) periodic limb movements were infrequent and did not lead to arousals. Chewing and bruxism are an equivalent of PLMs and are treated with the bite guard.  4) the patient has an irregular heartbeat. 5) nocturia reduced under condom cath.    The patient was advised of the nature of the diagnosed sleep disorder , the treatment options and risks for general a health and wellness arising from not treating the condition. Visit duration was 40  minutes.   Plan:  Treatment plan and additional workup : Order organic sleep disorder was not found. I  would like for the patient to try an extended release form of carbidopa levodopa in form of right tarry and I would like to convert him only for the last dose of the day to see if this affects his sleep positively. Since he has trouble adjusting his sleep position it may be beneficial to have an even dopamine supplementation throughout the night allowing him to be less rigid and less stiff. He will return to Dr Jannifer Franklin care.      Asencion Partridge Nydia Ytuarte MD  06/24/2014

## 2014-06-24 NOTE — Patient Instructions (Signed)
Carbidopa; Levodopa extended-release capsules  I would like to have Mr. Pooley use the retiree preparation of carbidopa levodopa for his last dose of the day only. This is a trial period of 30 days. If he responds well be could change other doses during the day also to the extended release form hopefully he would need medication only 3 times a day or perhaps even twice a day. What is this medicine? CARBIDOPA;LEVODOPA (kar bi DOE pa; lee voe DOE pa) is used to treat the symptoms of Parkinson's disease. This medicine may be used for other purposes; ask your health care provider or pharmacist if you have questions. COMMON BRAND NAME(S): Rytary What should I tell my health care provider before I take this medicine? They need to know if you have any of these conditions: -depression or other mental illness -diabetes -glaucoma -heart disease, including recent heart attack -history of irregular heart beat -kidney disease -liver disease -lung or breathing disease, like asthma -melanoma or suspicious skin lesions -stomach or intestine problems -an unusual or allergic reaction to levodopa, carbidopa, other medicines, foods, dyes, or preservatives -pregnant or trying to get pregnant -breast-feeding How should I use this medicine? Take this medicine by mouth with a glass of water. Follow the directions on the prescription label. Swallow whole. Do not crush, chew, or divide the capsules. If you have trouble swallowing, you may open the capsule and sprinkle the entire contents on 1 to 2 tablespoons of applesauce. Take the medicine/food mixture immediately, and do not store for future use. Take your doses at regular intervals. Do not take your medicine more often than directed. Do not stop taking except on the advice of your doctor or health care professional. A high fat, high calorie meal may slow the absorption of the medicine into your system and delay the onset of action by 2 to 3 hours. Consider taking  the first dose of the day 1 to 2 hours before eating. If you develop nausea, the medicine may be taken with food. Talk to your pediatrician regarding the use of this medicine in children. Special care may be needed. Overdosage: If you think you've taken too much of this medicine contact a poison control center or emergency room at once. Overdosage: If you think you have taken too much of this medicine contact a poison control center or emergency room at once. NOTE: This medicine is only for you. Do not share this medicine with others. What if I miss a dose? If you miss a dose, take it as soon as you can. If it is almost time for your next dose, take only that dose. Do not take double or extra doses. What may interact with this medicine? Do not take this medicine with any of the following medications: -MAOIs like Marplan, Nardil, and Parnate -reserpine -tetrabenazine This medicine may also interact with the following medications: -alcohol -droperidol -entacapone -iron supplements or multivitamins with iron -isoniazid, INH -linezolid -medicines for depression, anxiety, or psychotic disturbances -medicines for high blood pressure -medicines for sleep -metoclopramide -papaverine -procarbazine -tedizolid -rasagiline -selegiline -tolcapone This list may not describe all possible interactions. Give your health care provider a list of all the medicines, herbs, non-prescription drugs, or dietary supplements you use. Also tell them if you smoke, drink alcohol, or use illegal drugs. Some items may interact with your medicine. What should I watch for while using this medicine? Visit your doctor or health care professional for regular checks on your progress. It may be several weeks or  months before you feel the full benefits of this medicine. Continue to take your medicine on a regular schedule. Do not take any additional medicines for Parkinson's disease without first consulting with your health  care provider. You may get drowsy or dizzy. Do not drive, use machinery, or do anything that needs mental alertness until you know how this drug affects you. Do not stand or sit up quickly, especially if you are an older patient. This reduces the risk of dizzy or fainting spells. Alcohol can make you more drowsy and dizzy. Avoid alcoholic drinks. If you find that you have sudden feelings of wanting to sleep during normal activities, like cooking, watching television, or while driving or riding in a car, you should contact your health care professional. Dennis Bast may experience a 'wearing off' effect prior to the time for your next dose of this medicine. You may also experience an 'on-off' effect where the medicine apparently stops working for any time from a minute to several hours, then suddenly starts working again. Tell your doctor or health care professional if any of these symptoms happen to you. Your dose may need adjustment. A high protein meal can slow or prevent absorption of this medicine. Avoid high protein foods near the time of taking this medicine to help prevent these problems. Take this medicine at least 30 minutes before eating or one hour after meals. You may want to eat higher protein foods later in the day or in small amounts. If you have diabetes, you may get a false-positive result for sugar in your urine. Check with your doctor or health care professional. This medicine may discolor the urine or sweat, making it look darker or red in color. This is of no cause for concern. However, this may stain clothing or fabrics. There have been reports of increased sexual urges or other strong urges such as gambling while taking some medicines for Parkinson's disease. If you experience any of these urges while taking this medicine, you should report it to your health care provider as soon as possible. You should check your skin often for changes to moles and new growths while taking this medicine. Call  your doctor if you notice any of these changes. What side effects may I notice from receiving this medicine? Side effects that you should report to your doctor or health care professional as soon as possible: -allergic reactions like skin rash, itching or hives, swelling of the face, lips, or tongue -anxiety, confusion, or nervousness -falling asleep during normal activities like driving -fast, irregular heartbeat -hallucination, loss of contact with reality -mood changes like aggressive behavior, depression -stomach pain -trouble passing urine -uncontrolled movements of the mouth, head, hands, feet, shoulders, eyelids or other unusual muscle movements Side effects that usually do not require medical attention (Report these to your doctor or health care professional if they continue or are bothersome.): -headache -loss of appetite -muscle twitches -nausea/vomiting -nightmares, trouble sleeping This list may not describe all possible side effects. Call your doctor for medical advice about side effects. You may report side effects to FDA at 1-800-FDA-1088. Where should I keep my medicine? Keep out of the reach of children. Store at room temperature between 15 and 30 degrees C (59 and 86 degrees F). Protect from light and moisture. Throw away any unused medicine after the expiration date. NOTE: This sheet is a summary. It may not cover all possible information. If you have questions about this medicine, talk to your doctor, pharmacist, or health care  provider.  2015, [employer] Standard. (2013-02-21 13:32:33)

## 2014-06-24 NOTE — Telephone Encounter (Signed)
Pts wife called asking if pt should keep taking mirapex. Dr. Brett Fairy said to keep taking mirapex but to leave off the last dose of sinemet because she added rytary. Pt's wife verbalized understanding.

## 2014-06-25 ENCOUNTER — Other Ambulatory Visit: Payer: Self-pay | Admitting: Neurology

## 2014-06-25 ENCOUNTER — Other Ambulatory Visit: Payer: Medicare Other

## 2014-06-25 ENCOUNTER — Telehealth: Payer: Self-pay | Admitting: Neurology

## 2014-06-25 DIAGNOSIS — R296 Repeated falls: Secondary | ICD-10-CM | POA: Diagnosis not present

## 2014-06-25 DIAGNOSIS — G20 Parkinson's disease: Secondary | ICD-10-CM | POA: Diagnosis not present

## 2014-06-25 NOTE — Telephone Encounter (Signed)
Spoke to [employer] from [place] which is a home health agency. They were seeing Brian Aguirre today and noticed a BP of 200/85. Patient is asymptomatic. [name]' last note did state that he has quite labile blood pressures which may go from the 98J systolic to 191 systolic in the same day. So this appears be somewhat typical for [name]. The most important feature is that he is not symptomatic. Should that change, he should seek immediate attention. Thanks.

## 2014-06-26 DIAGNOSIS — G20 Parkinson's disease: Secondary | ICD-10-CM | POA: Diagnosis not present

## 2014-06-26 DIAGNOSIS — R296 Repeated falls: Secondary | ICD-10-CM | POA: Diagnosis not present

## 2014-06-30 DIAGNOSIS — G20 Parkinson's disease: Secondary | ICD-10-CM | POA: Diagnosis not present

## 2014-06-30 DIAGNOSIS — R296 Repeated falls: Secondary | ICD-10-CM | POA: Diagnosis not present

## 2014-07-03 ENCOUNTER — Ambulatory Visit
Admission: RE | Admit: 2014-07-03 | Discharge: 2014-07-03 | Disposition: A | Discharge status: Home / Self Care | Payer: Medicare Other | Source: Ambulatory Visit | Attending: Geriatric Medicine | Admitting: Geriatric Medicine

## 2014-07-03 DIAGNOSIS — R296 Repeated falls: Secondary | ICD-10-CM | POA: Diagnosis not present

## 2014-07-03 DIAGNOSIS — G909 Disorder of the autonomic nervous system, unspecified: Secondary | ICD-10-CM

## 2014-07-03 DIAGNOSIS — G20 Parkinson's disease: Secondary | ICD-10-CM | POA: Diagnosis not present

## 2014-07-03 DIAGNOSIS — N281 Cyst of kidney, acquired: Secondary | ICD-10-CM | POA: Diagnosis not present

## 2014-07-03 MED ORDER — GADOBENATE DIMEGLUMINE 529 MG/ML IV SOLN
15.0000 mL | Freq: Once | INTRAVENOUS | Status: AC | PRN
  Administered 2014-07-03: 15 mL via INTRAVENOUS

## 2014-07-03 NOTE — Telephone Encounter (Signed)
I called Heather. She questioned what the goal for the patient's BP management is and when she should be worried and contact us. I promised her I would check with [name] and call her back.

## 2014-07-03 NOTE — Telephone Encounter (Signed)
Heather with [facility] is calling again because the patient's BP is fluctuating. Please call and discuss. Thank you.

## 2014-07-03 NOTE — Telephone Encounter (Signed)
I called Heather. I left a message. The patient tends to run high blood pressures in the afternoon. If the blood pressure is 180 or under, I would not be too concerned. If the blood pressures are greater than 180 and a recheck in 15 or 20 minutes continues to show persistently elevated blood pressures, this may need to be treated. I do not see that the patient is on any blood pressure medications, he may need a when necessary prescription for Norvasc in low dose to treat this.

## 2014-07-06 ENCOUNTER — Emergency Department (HOSPITAL_COMMUNITY)
Admission: EM | Admit: 2014-07-06 | Discharge: 2014-07-06 | Disposition: A | Discharge status: Home / Self Care | Payer: Medicare Other | Attending: Emergency Medicine | Admitting: Emergency Medicine

## 2014-07-06 ENCOUNTER — Encounter (HOSPITAL_COMMUNITY): Payer: Self-pay | Admitting: Emergency Medicine

## 2014-07-06 ENCOUNTER — Telehealth: Payer: Self-pay | Admitting: *Deleted

## 2014-07-06 DIAGNOSIS — Z87828 Personal history of other (healed) physical injury and trauma: Secondary | ICD-10-CM | POA: Insufficient documentation

## 2014-07-06 DIAGNOSIS — G20 Parkinson's disease: Secondary | ICD-10-CM | POA: Diagnosis not present

## 2014-07-06 DIAGNOSIS — Z8719 Personal history of other diseases of the digestive system: Secondary | ICD-10-CM | POA: Diagnosis not present

## 2014-07-06 DIAGNOSIS — Z79899 Other long term (current) drug therapy: Secondary | ICD-10-CM | POA: Insufficient documentation

## 2014-07-06 DIAGNOSIS — Z7982 Long term (current) use of aspirin: Secondary | ICD-10-CM | POA: Insufficient documentation

## 2014-07-06 DIAGNOSIS — Z8781 Personal history of (healed) traumatic fracture: Secondary | ICD-10-CM | POA: Insufficient documentation

## 2014-07-06 DIAGNOSIS — R6889 Other general symptoms and signs: Secondary | ICD-10-CM | POA: Diagnosis not present

## 2014-07-06 DIAGNOSIS — Z9181 History of falling: Secondary | ICD-10-CM | POA: Insufficient documentation

## 2014-07-06 DIAGNOSIS — R296 Repeated falls: Secondary | ICD-10-CM | POA: Diagnosis not present

## 2014-07-06 DIAGNOSIS — F329 Major depressive disorder, single episode, unspecified: Secondary | ICD-10-CM | POA: Diagnosis not present

## 2014-07-06 DIAGNOSIS — Z87438 Personal history of other diseases of male genital organs: Secondary | ICD-10-CM | POA: Insufficient documentation

## 2014-07-06 DIAGNOSIS — I159 Secondary hypertension, unspecified: Secondary | ICD-10-CM | POA: Diagnosis not present

## 2014-07-06 DIAGNOSIS — I10 Essential (primary) hypertension: Secondary | ICD-10-CM | POA: Diagnosis present

## 2014-07-06 DIAGNOSIS — M810 Age-related osteoporosis without current pathological fracture: Secondary | ICD-10-CM | POA: Insufficient documentation

## 2014-07-06 LAB — CBC WITH DIFFERENTIAL/PLATELET
Basophils Absolute: 0.1 10*3/uL (ref 0.0–0.1)
Basophils Relative: 1 % (ref 0–1)
Eosinophils Absolute: 0.3 10*3/uL (ref 0.0–0.7)
Eosinophils Relative: 5 % (ref 0–5)
HEMATOCRIT: 39.2 % (ref 39.0–52.0)
Hemoglobin: 13.2 g/dL (ref 13.0–17.0)
LYMPHS ABS: 1.1 10*3/uL (ref 0.7–4.0)
LYMPHS PCT: 21 % (ref 12–46)
MCH: 30.3 pg (ref 26.0–34.0)
MCHC: 33.7 g/dL (ref 30.0–36.0)
MCV: 90.1 fL (ref 78.0–100.0)
Monocytes Absolute: 0.4 10*3/uL (ref 0.1–1.0)
Monocytes Relative: 8 % (ref 3–12)
NEUTROS ABS: 3.3 10*3/uL (ref 1.7–7.7)
NEUTROS PCT: 65 % (ref 43–77)
PLATELETS: 241 10*3/uL (ref 150–400)
RBC: 4.35 MIL/uL (ref 4.22–5.81)
RDW: 13.2 % (ref 11.5–15.5)
WBC: 5.1 10*3/uL (ref 4.0–10.5)

## 2014-07-06 LAB — BASIC METABOLIC PANEL
Anion gap: 11 (ref 5–15)
BUN: 21 mg/dL — AB (ref 6–20)
CO2: 25 mmol/L (ref 22–32)
CREATININE: 0.87 mg/dL (ref 0.61–1.24)
Calcium: 9.1 mg/dL (ref 8.9–10.3)
Chloride: 98 mmol/L — ABNORMAL LOW (ref 101–111)
GFR calc non Af Amer: >60 mL/min (ref >60)
Glucose, Bld: 86 mg/dL (ref 65–99)
Potassium: 4.7 mmol/L (ref 3.5–5.1)
Sodium: 134 mmol/L — ABNORMAL LOW (ref 135–145)

## 2014-07-06 LAB — URINALYSIS, ROUTINE W REFLEX MICROSCOPIC
Bilirubin Urine: NEGATIVE
Glucose, UA: NEGATIVE mg/dL
Hgb urine dipstick: NEGATIVE
KETONES UR: NEGATIVE mg/dL
Leukocytes, UA: NEGATIVE
NITRITE: NEGATIVE
PROTEIN: NEGATIVE mg/dL
Specific Gravity, Urine: 1.004 — ABNORMAL LOW (ref 1.005–1.030)
UROBILINOGEN UA: 0.2 mg/dL (ref 0.0–1.0)
pH: 7 (ref 5.0–8.0)

## 2014-07-06 NOTE — ED Notes (Signed)
Bed: WA20 Expected date:  Expected time:  Means of arrival:  Comments: ems 

## 2014-07-06 NOTE — ED Notes (Signed)
Pt given sandwich and sprite.  

## 2014-07-06 NOTE — Telephone Encounter (Signed)
Spoke with Nira Conn on the phone who stated that the BP was still elevated at 200/90. I asked if he was having any symptoms, she told me that he said he was not. I encouraged her to call EMS and have them come evaluate him for the BP.  I talked with Dr. Leta Baptist who told me that the home care nurse would need to reach out to the PCP Dr. Felipa Eth and inform him about the BP.  I called heather back and spoke with her and asked her to make sure that the pt followed up with Dr. Felipa Eth about the BP issues. She told me she would make sure and that he had an appt tomorrow and that she would let him know about the BP issues that the pt seems to have . She thanked me

## 2014-07-06 NOTE — ED Notes (Addendum)
Family at bedside. Reported that pt's BP normally elevates in the afternoon but not as high. Pt's BP is normal in the morning and facility has monitoring it. Pt does not take any BP medication. Pt wears compression stockings in the morning and off in the afternoon to assist with controlling BP.

## 2014-07-06 NOTE — Discharge Instructions (Signed)
Hypertension Hypertension is another name for high blood pressure. High blood pressure forces your heart to work harder to pump blood. A blood pressure reading has two numbers, which includes a higher number over a lower number (example: 110/72). HOME CARE   Have your blood pressure rechecked by your doctor.  Only take medicine as told by your doctor. Follow the directions carefully. The medicine does not work as well if you skip doses. Skipping doses also puts you at risk for problems.  Do not smoke.  Monitor your blood pressure at home as told by your doctor. GET HELP IF:  You think you are having a reaction to the medicine you are taking.  You have repeat headaches or feel dizzy.  You have puffiness (swelling) in your ankles.  You have trouble with your vision. GET HELP RIGHT AWAY IF:   You get a very bad headache and are confused.  You feel weak, numb, or faint.  You get chest or belly (abdominal) pain.  You throw up (vomit).  You cannot breathe very well. MAKE SURE YOU:   Understand these instructions.  Will watch your condition.  Will get help right away if you are not doing well or get worse. Document Released: 06/14/2007 Document Revised: 12/31/2012 Document Reviewed: 10/18/2012 ExitCare Patient Information 2015 [employer]. This information is not intended to replace advice given to you by your health care provider. Make sure you discuss any questions you have with your health care provider.  

## 2014-07-06 NOTE — Telephone Encounter (Signed)
Heather called. The patient's systolic BP has been running over 180 for over 30 minutes. The last time she checked it around 3:45 his left arm was 190/80 and right arm was 200/90. She wondered if the patient should have a Rx for Norvasc as [name] had mentioned. She stated they did recently take his compression stockings off and thought that it may come down. She is checking his BP one more time before she leaves but is leaving in just a few minutes. I am sending this to the [facility].

## 2014-07-06 NOTE — ED Notes (Signed)
Pt is from [facility] [place] and was sent in for asymptomatic hypertension. 200s/100s. Does not take BP meds. Alert and oriented.

## 2014-07-06 NOTE — ED Provider Notes (Signed)
CSN: [id number]     Arrival date & time 07/06/14  1658 History   First MD Initiated Contact with Patient 07/06/14 1820     Chief Complaint  Patient presents with  . Hypertension     (Consider location/radiation/quality/duration/timing/severity/associated sxs/prior Treatment) HPI   Brian Aguirre is a 81 y.o. male who presents for evaluation of high blood pressure. Patient's blood pressure has been running higher today, than usual, by report. He typically has very low blood pressures in the morning as low as 70 systolic. For this, he typically wears compression stockings, and abdominal binders. These were apparently removed today and he still had high blood pressure. He is not taking medications for blood pressure. He is due to see his PCP tomorrow for follow-up on an abdominal MRI. He denies headache, chest pain, back pain, weakness or dizziness. There've been no other recent medication changes. There are no other known modifying factors.   Past Medical History  Diagnosis Date  . Parkinson disease   . Osteoporosis   . Dizziness   . Depression   . Hypertension   . Gait disturbance   . Benign enlargement of prostate   . Orthostatic hypotension   . Fracture, rib   . Memory loss   . Chronic constipation 02/26/2014  . History of fall 03/09/2014  . Contusion of forehead 03/09/2014   Past Surgical History  Procedure Laterality Date  . Appendectomy    . Tonsillectomy    . Hernia repair    . Cataract extraction Left    Family History  Problem Relation Age of Onset  . Cancer Mother   . Heart failure Father   . Breast cancer Daughter    History  Substance Use Topics  . Smoking status: Never Smoker   . Smokeless tobacco: Never Used  . Alcohol Use: No    Review of Systems  All other systems reviewed and are negative.     Allergies  Myrbetriq and Stalevo  Home Medications   Prior to Admission medications   Medication Sig Start Date End Date Taking? Authorizing Provider   acetaminophen (TYLENOL) 500 MG tablet Take 1,000 mg by mouth every 6 (six) hours as needed for mild pain.    Historical Provider, MD  aspirin EC 81 MG tablet Take 81 mg by mouth at bedtime.    Historical Provider, MD  carbidopa-levodopa (SINEMET IR) 25-100 MG per tablet Take 1 tablet by mouth 3 (three) times daily. 06/17/14   Kathrynn Ducking, MD  carbidopa-levodopa (SINEMET IR) 25-250 MG per tablet TAKE  (1)  TABLET  FOUR TIMES DAILY. 05/26/14   Kathrynn Ducking, MD  Carbidopa-Levodopa ER (RYTARY) 48.75-195 MG CPCR Take 48.5 mg by mouth daily after supper. 06/24/14   Asencion Partridge Dohmeier, MD  docusate sodium (COLACE) 100 MG capsule Take 200 mg by mouth at bedtime.     Historical Provider, MD  fish oil-omega-3 fatty acids 1000 MG capsule Take 1 g by mouth daily with breakfast.     Historical Provider, MD  ibuprofen (ADVIL,MOTRIN) 200 MG tablet Take 400 mg by mouth every 6 (six) hours as needed (pain).    Historical Provider, MD  lubiprostone (AMITIZA) 24 MCG capsule Take 1 capsule (24 mcg total) by mouth 2 (two) times daily with a meal. 02/20/14   Kathrynn Ducking, MD  Multiple Vitamin (MULTIVITAMIN WITH MINERALS) TABS Take 1 tablet by mouth every morning.     Historical Provider, MD  Multiple Vitamins-Minerals (PRESERVISION AREDS 2 PO) Take 2 tablets  by mouth 2 (two) times daily.    Historical Provider, MD  pramipexole (MIRAPEX) 1 MG tablet Take 1 mg by mouth 3 (three) times daily.    Historical Provider, MD  ranitidine (ZANTAC) 75 MG tablet Take 75 mg by mouth daily as needed for heartburn (reflux).     Historical Provider, MD  selegiline (ELDEPRYL) 5 MG tablet Take 1 tablet (5 mg total) by mouth 2 (two) times daily. Takes at breakfast and lunch. 05/04/14   Kathrynn Ducking, MD  triamcinolone (NASACORT) 55 MCG/ACT AERO nasal inhaler Place 2 sprays into the nose as needed (allergies).    Historical Provider, MD  vitamin B-12 (CYANOCOBALAMIN) 500 MCG tablet Take 500 mcg by mouth every morning.     Historical  Provider, MD  vitamin D, CHOLECALCIFEROL, 400 UNITS tablet Take 400 Units by mouth every morning.    Historical Provider, MD   BP 123/97 mmHg  Pulse 68  Temp(Src) 98.1 F (36.7 C) (Oral)  Resp 20  SpO2 95% Physical Exam  Constitutional: He is oriented to person, place, and time. He appears well-developed.  Elderly, frail  HENT:  Head: Normocephalic and atraumatic.  Right Ear: External ear normal.  Left Ear: External ear normal.  Eyes: Conjunctivae and EOM are normal. Pupils are equal, round, and reactive to light.  Neck: Normal range of motion and phonation normal. Neck supple.  Cardiovascular: Normal rate, regular rhythm and normal heart sounds.   Pulmonary/Chest: Effort normal and breath sounds normal. He exhibits no bony tenderness.  Abdominal: Soft. There is no tenderness.  Musculoskeletal: Normal range of motion.  Neurological: He is alert and oriented to person, place, and time. No cranial nerve deficit or sensory deficit. He exhibits normal muscle tone. Coordination normal.  No dysarthria and aphasia or nystagmus  Skin: Skin is warm, dry and intact.  Psychiatric: He has a normal mood and affect. His behavior is normal.  Nursing note and vitals reviewed.   ED Course  Procedures (including critical care time)  Patient is asymptomatic, for high blood pressure, time, he was seen. There is no indication for acute CVA, ACS or embolic instability. Patient will be screened for occult evidence for hypertensive urgency. I anticipate that he will be able to go home and follow-up with his PCP, tomorrow.   Medications - No data to display  Patient Vitals for the past 24 hrs:  BP Temp Temp src Pulse Resp SpO2  07/06/14 1933 123/97 mmHg - - 68 20 95 %  07/06/14 1900 (!) 221/95 mmHg - - 67 - 97 %  07/06/14 1845 (!) 206/73 mmHg - - 62 - 99 %  07/06/14 1830 (!) 202/73 mmHg - - (!) 59 - 98 %  07/06/14 1815 (!) 215/83 mmHg - - 64 - 100 %  07/06/14 1802 - - - - - 98 %  07/06/14 1800  198/70 mmHg - - 60 - 98 %  07/06/14 1730 (!) 211/79 mmHg - - - - -  07/06/14 1715 (!) 209/96 mmHg - - - - -  07/06/14 1711 (!) 210/74 mmHg 98.1 F (36.7 C) Oral 60 16 99 %    8:39 PM Reevaluation with update and discussion. After initial assessment and treatment, an updated evaluation reveals patient is comfortable. Blood pressure is markedly improved. This is spontaneous improvement. Findings discussed with patient and wife, all questions answered.Daleen Bo L    Labs Review Labs Reviewed  URINALYSIS, ROUTINE W REFLEX MICROSCOPIC (NOT AT [facility]) - Abnormal; Notable for the following:  Specific Gravity, Urine 1.004 (*)    All other components within normal limits  CBC WITH DIFFERENTIAL/PLATELET  BASIC METABOLIC PANEL    Imaging Review No results found.   EKG Interpretation   Date/Time:  Monday July 06 2014 19:23:22 EDT Ventricular Rate:  64 PR Interval:  261 QRS Duration: 130 QT Interval:  459 QTC Calculation: 474 R Axis:   -40 Text Interpretation:  Sinus rhythm Prolonged PR interval Left bundle  branch block since last tracing no significant change Confirmed by Jakobee Brackins   MD, Teo Moede ([id number]) on 07/06/2014 7:44:05 PM      MDM   Final diagnoses:  Secondary hypertension, unspecified    Labile blood pressure, without evidence for end organ damage or symptoms. Doubt ACS, PE, CVA, or metabolic instability.   Nursing Notes Reviewed/ Care Coordinated Applicable Imaging Reviewed Interpretation of Laboratory Data incorporated into ED treatment  The patient appears reasonably screened and/or stabilized for discharge and I doubt any other medical condition or other [facility] requiring further screening, evaluation, or treatment in the ED at this time prior to discharge.  Plan: Home Medications- usual; Home Treatments- rest; return here if the recommended treatment, does not improve the symptoms; Recommended follow up- PCP tomorrow as scheduled    Daleen Bo, MD 07/06/14  2041

## 2014-07-08 ENCOUNTER — Other Ambulatory Visit: Payer: Self-pay | Admitting: Neurology

## 2014-07-08 DIAGNOSIS — G20 Parkinson's disease: Secondary | ICD-10-CM | POA: Diagnosis not present

## 2014-07-08 DIAGNOSIS — I10 Essential (primary) hypertension: Secondary | ICD-10-CM | POA: Diagnosis not present

## 2014-07-08 DIAGNOSIS — R05 Cough: Secondary | ICD-10-CM | POA: Diagnosis not present

## 2014-07-08 DIAGNOSIS — N281 Cyst of kidney, acquired: Secondary | ICD-10-CM | POA: Diagnosis not present

## 2014-07-09 DIAGNOSIS — G20 Parkinson's disease: Secondary | ICD-10-CM | POA: Diagnosis not present

## 2014-07-09 DIAGNOSIS — R296 Repeated falls: Secondary | ICD-10-CM | POA: Diagnosis not present

## 2014-07-20 ENCOUNTER — Encounter: Payer: Self-pay | Admitting: Neurology

## 2014-07-20 ENCOUNTER — Ambulatory Visit (INDEPENDENT_AMBULATORY_CARE_PROVIDER_SITE_OTHER): Payer: Medicare Other | Admitting: Neurology

## 2014-07-20 VITALS — BP 117/60 | HR 60 | Ht 71.0 in | Wt 158.0 lb

## 2014-07-20 DIAGNOSIS — R413 Other amnesia: Secondary | ICD-10-CM | POA: Diagnosis not present

## 2014-07-20 DIAGNOSIS — R269 Unspecified abnormalities of gait and mobility: Secondary | ICD-10-CM | POA: Diagnosis not present

## 2014-07-20 DIAGNOSIS — G20 Parkinson's disease: Secondary | ICD-10-CM

## 2014-07-20 HISTORY — DX: Other amnesia: R41.3

## 2014-07-20 MED ORDER — AMLODIPINE BESYLATE 2.5 MG PO TABS: 2.5000 mg | ORAL_TABLET | Freq: Every day | ORAL | Status: DC | PRN

## 2014-07-20 NOTE — Patient Instructions (Addendum)
Blood pressure management, if the systolic blood pressure is greater than 180, recheck again in 30 minutes, if the blood pressure remains elevated, may give one tablet of Norvasc (amlodipine). Otherwise, do not give this blood pressure medication.  Orthostatic Hypotension Orthostatic hypotension is a sudden drop in blood pressure. It happens when you quickly stand up from a seated or lying position. You may feel dizzy or light-headed. This can last for just a few seconds or for up to a few minutes. It is usually not a serious problem. However, if this happens frequently or gets worse, it can be a sign of something more serious. CAUSES  Different things can cause orthostatic hypotension, including:   Loss of body fluids (dehydration).  Medicines that lower blood pressure.  Sudden changes in posture, such as standing up quickly after you have been sitting or lying down.  Taking too much of your medicine. SIGNS AND SYMPTOMS   Light-headedness or dizziness.   Fainting or near-fainting.   A fast heart rate.   Weakness.   Feeling tired (fatigue).  DIAGNOSIS  Your health care provider may do several things to help diagnose your condition and identify the cause. These may include:   Taking a medical history and doing a physical exam.  Checking your blood pressure. Your health care provider will check your blood pressure when you are:  Lying down.  Sitting.  Standing.  Using tilt table testing. In this test, you lie down on a table that moves from a lying position to a standing position. You will be strapped onto the table. This test monitors your blood pressure and heart rate when you are in different positions. TREATMENT  Treatment will vary depending on the cause. Possible treatments include:   Changing the dosage of your medicines.  Wearing compression stockings on your lower legs.  Standing up slowly after sitting or lying down.  Eating more salt.  Eating  frequent, small meals.  In some cases, getting IV fluids.  Taking medicine to enhance fluid retention. HOME CARE INSTRUCTIONS  Only take over-the-counter or prescription medicines as directed by your health care provider.  Follow your health care provider's instructions for changing the dosage of your current medicines.  Do not stop or adjust your medicine on your own.  Stand up slowly after sitting or lying down. This allows your body to adjust to the different position.  Wear compression stockings as directed.  Eat extra salt as directed.  Do not add extra salt to your diet unless directed to by your health care provider.  Eat frequent, small meals.  Avoid standing suddenly after eating.  Avoid hot showers or excessive heat as directed by your health care provider.  Keep all follow-up appointments. SEEK MEDICAL CARE IF:  You continue to feel dizzy or light-headed after standing.  You feel groggy or confused.  You feel cold, clammy, or sick to your stomach (nauseous).  You have blurred vision.  You feel short of breath. SEEK IMMEDIATE MEDICAL CARE IF:   You faint after standing.  You have chest pain.  You have difficulty breathing.   You lose feeling or movement in your arms or legs.   You have slurred speech or difficulty talking, or you are unable to talk.  MAKE SURE YOU:   Understand these instructions.  Will watch your condition.  Will get help right away if you are not doing well or get worse. Document Released: 12/16/2001 Document Revised: 12/31/2012 Document Reviewed: 10/18/2012 ExitCare Patient Information 2015  [employer]. This information is not intended to replace advice given to you by your health care provider. Make sure you discuss any questions you have with your health care provider.

## 2014-07-20 NOTE — Progress Notes (Signed)
Reason for visit: Parkinson's disease  Linda A Amores is an 81 y.o. male  History of present illness:  Mr. Mcmanus is an 81 year old right-handed white male with a history of Parkinson's disease and orthostatic hypotension. The patient has had wide variations in his blood pressure that continue, he may be under 70 systolic in the morning and greater than 709 systolic in the afternoon. The patient wears an abdominal binder, and he has compression stockings. The patient can sense the low blood pressure, he feels lightheaded. He has not sustained any blackout episodes or falls. The patient has had increasing problems with mobility, he uses a walker for ambulation. He requires assistance with standing at this time. He has a tendency to lean backwards. He has had Rytary added in the evening which helps his functioning in the morning. He returns for an evaluation. He continues to have problems with hypophonia and dysarthria, his wife has a lot of difficulty understanding what he is saying.  Past Medical History  Diagnosis Date  . Parkinson disease   . Osteoporosis   . Dizziness   . Depression   . Hypertension   . Gait disturbance   . Benign enlargement of prostate   . Orthostatic hypotension   . Fracture, rib   . Memory loss   . Chronic constipation 02/26/2014  . History of fall 03/09/2014  . Contusion of forehead 03/09/2014  . Memory difficulties 07/20/2014    Past Surgical History  Procedure Laterality Date  . Appendectomy    . Tonsillectomy    . Hernia repair    . Cataract extraction Left     Family History  Problem Relation Age of Onset  . Cancer Mother   . Heart failure Father   . Breast cancer Daughter     Social history:  reports that he has never smoked. He has never used smokeless tobacco. He reports that he does not drink alcohol or use illicit drugs.    Allergies  Allergen Reactions  . Myrbetriq [Mirabegron] Nausea And Vomiting  . Stalevo  [Carbidopa-Levodopa-Entacapone] Diarrhea    GI upset    Medications:  Prior to Admission medications   Medication Sig Start Date End Date Taking? Authorizing Provider  acetaminophen (TYLENOL) 500 MG tablet Take 1,000 mg by mouth every 6 (six) hours as needed for mild pain.   Yes Historical Provider, MD  AMITIZA 24 MCG capsule TAKE 1 CAPSULE TWICE DAILY WITH MEALS. 07/08/14  Yes Kathrynn Ducking, MD  aspirin EC 81 MG tablet Take 81 mg by mouth at bedtime.   Yes Historical Provider, MD  carbidopa-levodopa (SINEMET IR) 25-100 MG per tablet Take 1 tablet by mouth 3 (three) times daily. 06/17/14  Yes Kathrynn Ducking, MD  carbidopa-levodopa (SINEMET IR) 25-250 MG per tablet TAKE  (1)  TABLET  FOUR TIMES DAILY. 05/26/14  Yes Kathrynn Ducking, MD  Carbidopa-Levodopa ER (RYTARY) 48.75-195 MG CPCR Take 48.5 mg by mouth daily after supper. Patient taking differently: Take 48.5 mg by mouth at bedtime.  06/24/14  Yes Carmen Dohmeier, MD  docusate sodium (COLACE) 100 MG capsule Take 200 mg by mouth at bedtime.    Yes Historical Provider, MD  fish oil-omega-3 fatty acids 1000 MG capsule Take 1 g by mouth daily with breakfast.    Yes Historical Provider, MD  ibuprofen (ADVIL,MOTRIN) 200 MG tablet Take 400 mg by mouth every 6 (six) hours as needed (pain).   Yes Historical Provider, MD  Multiple Vitamin (MULTIVITAMIN WITH MINERALS) TABS  Take 1 tablet by mouth every morning.    Yes Historical Provider, MD  pramipexole (MIRAPEX) 1 MG tablet TAKE 1 TABLET 3 TIMES A DAY. 07/08/14  Yes Kathrynn Ducking, MD  ranitidine (ZANTAC) 75 MG tablet Take 75 mg by mouth daily as needed for heartburn (reflux).    Yes Historical Provider, MD  selegiline (ELDEPRYL) 5 MG tablet Take 1 tablet (5 mg total) by mouth 2 (two) times daily. Takes at breakfast and lunch. 05/04/14  Yes Kathrynn Ducking, MD  triamcinolone (NASACORT) 55 MCG/ACT AERO nasal inhaler Place 2 sprays into the nose as needed (allergies).   Yes Historical Provider, MD    vitamin B-12 (CYANOCOBALAMIN) 500 MCG tablet Take 500 mcg by mouth every morning.    Yes Historical Provider, MD  vitamin D, CHOLECALCIFEROL, 400 UNITS tablet Take 400 Units by mouth every morning.   Yes Historical Provider, MD    ROS:  Out of a complete 14 system review of symptoms, the patient complains only of the following symptoms, and all other reviewed systems are negative.  Double vision Cough Daytime drowsiness, snoring Speech difficulty Confusion  Blood pressure 117/60, pulse 60, height 5\' 11"  (1.803 m), weight 158 lb (71.668 kg).  Physical Exam  General: The patient is alert and cooperative at the time of the examination.  Skin: No significant peripheral edema is noted.   Neurologic Exam  Mental status: The patient is alert and oriented x 2 at the time of the examination (not oriented to date). The Mini-Mental Status Examination done today shows a total score 24/30.   Cranial nerves: Facial symmetry is present. Speech is hypophonic, slightly dysarthric. Extraocular movements are full. Visual fields are full. Masking of the face is seen.  Motor: The patient has good strength in all 4 extremities.  Sensory examination: Soft touch sensation is symmetric on the face, arms, and legs.  Coordination: The patient has good finger-nose-finger and heel-to-shin bilaterally.  Gait and station: The patient requires assistance with standing. Once up, he has a tendency to lean backwards. He is able to angulate with a walker with relatively good stride, he has some hesitancy with turns. Tandem gait was not attempted.  Reflexes: Deep tendon reflexes are symmetric.   Assessment/Plan:  1. Parkinson's disease  2. Gait disorder  3. Orthostatic hypotension  4. Memory disorder  The patient is having increasing problems with mobility. He is staying safe, he has not had any significant falls. He continues to have wide variations in systolic blood pressure. We will have his  family recheck the blood pressure within 30 minutes if the systolic pressures greater than 180. If it remains elevated, they will give him a 2.5 mg Norvasc tablet. Otherwise, he is not to be on any blood pressure medications. He will continue the Rytary at night. Will follow-up through this office in about 3 months.  Jill Alexanders MD 07/20/2014 6:09 PM  [facility] [address] [place], [place] [address]  Phone [phone] Fax [phone]

## 2014-07-22 ENCOUNTER — Telehealth: Payer: Self-pay | Admitting: *Deleted

## 2014-07-22 DIAGNOSIS — G20 Parkinson's disease: Secondary | ICD-10-CM | POA: Diagnosis not present

## 2014-07-22 DIAGNOSIS — R296 Repeated falls: Secondary | ICD-10-CM | POA: Diagnosis not present

## 2014-07-22 NOTE — Telephone Encounter (Signed)
Received call from Nira Conn, [place] with [facility]. She states she is at patient's home, and at 12:30 pm his BP was 211/103 . She removed his compression stockings, rechecked BP at 12:45 pm, was 201/96. She states Dr Jannifer Franklin ordered Norvasc with some restrictions, but she requests clarification on the restrictions. She requests a call back, [phone].  Informed her this RN will route her request to Dr Jannifer Franklin and his RN, will also verbally inform Charisse Klinefelter, RN of her request. She verbalized understanding, appreciation.

## 2014-07-22 NOTE — Telephone Encounter (Signed)
I called the patient, talk with the wife. The patient had a blood pressure around 12:30 PM of 211/13. The most recent blood pressure is 301 systolic, if they checked the blood pressure again and remains over 180, they are to take a Norvasc tablet.

## 2014-07-22 NOTE — Telephone Encounter (Signed)
I called Heather and left a voicemail. I asked her to check the patient's BP one more time. If it remains greater than 180 for 30 minutes, give the Norvasc as it is ordered. I asked her to then recheck the patient's BP and let us know if it does not come down. I asked her to please call back if she has any questions.

## 2014-07-27 ENCOUNTER — Telehealth: Payer: Self-pay | Admitting: Neurology

## 2014-07-27 NOTE — Telephone Encounter (Signed)
[facility] called and states they are discharging pt, all goals have been met.

## 2014-07-27 NOTE — Telephone Encounter (Signed)
Events noted

## 2014-08-24 DIAGNOSIS — H5022 Vertical strabismus, left eye: Secondary | ICD-10-CM | POA: Diagnosis not present

## 2014-09-01 ENCOUNTER — Other Ambulatory Visit: Payer: Self-pay | Admitting: Neurology

## 2014-09-09 DIAGNOSIS — G20 Parkinson's disease: Secondary | ICD-10-CM | POA: Diagnosis not present

## 2014-09-09 DIAGNOSIS — I10 Essential (primary) hypertension: Secondary | ICD-10-CM | POA: Diagnosis not present

## 2014-09-09 DIAGNOSIS — Z79899 Other long term (current) drug therapy: Secondary | ICD-10-CM | POA: Diagnosis not present

## 2014-09-09 DIAGNOSIS — E222 Syndrome of inappropriate secretion of antidiuretic hormone: Secondary | ICD-10-CM | POA: Diagnosis not present

## 2014-09-09 DIAGNOSIS — Z1389 Encounter for screening for other disorder: Secondary | ICD-10-CM | POA: Diagnosis not present

## 2014-09-09 DIAGNOSIS — D696 Thrombocytopenia, unspecified: Secondary | ICD-10-CM | POA: Diagnosis not present

## 2014-09-09 DIAGNOSIS — Z0000 Encounter for general adult medical examination without abnormal findings: Secondary | ICD-10-CM | POA: Diagnosis not present

## 2014-10-08 DIAGNOSIS — Z23 Encounter for immunization: Secondary | ICD-10-CM | POA: Diagnosis not present

## 2014-10-14 DIAGNOSIS — Z85828 Personal history of other malignant neoplasm of skin: Secondary | ICD-10-CM | POA: Diagnosis not present

## 2014-10-14 DIAGNOSIS — L821 Other seborrheic keratosis: Secondary | ICD-10-CM | POA: Diagnosis not present

## 2014-10-14 DIAGNOSIS — L570 Actinic keratosis: Secondary | ICD-10-CM | POA: Diagnosis not present

## 2014-10-14 DIAGNOSIS — L218 Other seborrheic dermatitis: Secondary | ICD-10-CM | POA: Diagnosis not present

## 2014-11-04 DIAGNOSIS — L111 Transient acantholytic dermatosis [Grover]: Secondary | ICD-10-CM | POA: Diagnosis not present

## 2014-11-04 DIAGNOSIS — D1801 Hemangioma of skin and subcutaneous tissue: Secondary | ICD-10-CM | POA: Diagnosis not present

## 2014-11-04 DIAGNOSIS — L245 Irritant contact dermatitis due to other chemical products: Secondary | ICD-10-CM | POA: Diagnosis not present

## 2014-11-04 DIAGNOSIS — Z85828 Personal history of other malignant neoplasm of skin: Secondary | ICD-10-CM | POA: Diagnosis not present

## 2014-11-04 DIAGNOSIS — L821 Other seborrheic keratosis: Secondary | ICD-10-CM | POA: Diagnosis not present

## 2014-11-04 DIAGNOSIS — L570 Actinic keratosis: Secondary | ICD-10-CM | POA: Diagnosis not present

## 2014-11-17 DIAGNOSIS — H5021 Vertical strabismus, right eye: Secondary | ICD-10-CM | POA: Diagnosis not present

## 2014-11-18 DIAGNOSIS — M47816 Spondylosis without myelopathy or radiculopathy, lumbar region: Secondary | ICD-10-CM | POA: Diagnosis not present

## 2014-11-18 DIAGNOSIS — M1712 Unilateral primary osteoarthritis, left knee: Secondary | ICD-10-CM | POA: Diagnosis not present

## 2014-11-18 DIAGNOSIS — M7662 Achilles tendinitis, left leg: Secondary | ICD-10-CM | POA: Diagnosis not present

## 2014-11-19 ENCOUNTER — Encounter: Payer: Self-pay | Admitting: Neurology

## 2014-11-19 ENCOUNTER — Ambulatory Visit (INDEPENDENT_AMBULATORY_CARE_PROVIDER_SITE_OTHER): Payer: Medicare Other | Admitting: Neurology

## 2014-11-19 VITALS — BP 170/72 | HR 62 | Ht 71.0 in | Wt 161.0 lb

## 2014-11-19 DIAGNOSIS — G20 Parkinson's disease: Secondary | ICD-10-CM | POA: Diagnosis not present

## 2014-11-19 DIAGNOSIS — R269 Unspecified abnormalities of gait and mobility: Secondary | ICD-10-CM

## 2014-11-19 DIAGNOSIS — I951 Orthostatic hypotension: Secondary | ICD-10-CM | POA: Diagnosis not present

## 2014-11-19 NOTE — Progress Notes (Signed)
Reason for visit: Parkinson's disease  Brian Aguirre is an 81 y.o. male  History of present illness:  Brian Aguirre is an 81 year old right-handed white male with a history of Parkinson's disease. The patient has done fairly well since last seen, he remains active, using a walker for ambulation. He has a very definite tendency to lean backwards, but he does remain safe, he has had no falls. The patient is having wide variations in blood pressure, the first blood pressure in the morning may be elevated, then there is usually a lowering of blood pressure in the mid-morning, only to elevate again later in the afternoon. The patient has not had any syncopal episodes. The systolic blood pressure may range from the upper 80s to 190. The patient has had some back pain and some hip discomfort, he is being followed by Dr. Lynann Bologna for this, and he was recently placed on a 5 mg 6 day prednisone Dosepak. The patient may get epidural steroid injections in the future. He reports some left heel pain that is present before he gets out of bed in the morning. He denies any sciatica pain otherwise. No other new medical issues have come up since last seen. The patient does report some difficulty swallowing pills at times.   Past Medical History  Diagnosis Date  . Parkinson disease ([place])   . Osteoporosis   . Dizziness   . Depression   . Hypertension   . Gait disturbance   . Benign enlargement of prostate   . Orthostatic hypotension   . Fracture, rib   . Memory loss   . Chronic constipation 02/26/2014  . History of fall 03/09/2014  . Contusion of forehead 03/09/2014  . Memory difficulties 07/20/2014    Past Surgical History  Procedure Laterality Date  . Appendectomy    . Tonsillectomy    . Hernia repair    . Cataract extraction Left     Family History  Problem Relation Age of Onset  . Cancer Mother   . Heart failure Father   . Breast cancer Daughter     Social history:  reports that he has never  smoked. He has never used smokeless tobacco. He reports that he does not drink alcohol or use illicit drugs.    Allergies  Allergen Reactions  . Myrbetriq [Mirabegron] Nausea And Vomiting  . Stalevo [Carbidopa-Levodopa-Entacapone] Diarrhea    GI upset    Medications:  Prior to Admission medications   Medication Sig Start Date End Date Taking? Authorizing Provider  acetaminophen (TYLENOL) 500 MG tablet Take 1,000 mg by mouth every 6 (six) hours as needed for mild pain.   Yes Historical Provider, MD  AMITIZA 24 MCG capsule TAKE 1 CAPSULE TWICE DAILY WITH MEALS. 07/08/14  Yes Kathrynn Ducking, MD  amLODipine (NORVASC) 2.5 MG tablet Take 1 tablet (2.5 mg total) by mouth daily as needed. Use for persistent blood pressure > 99991111 systolic 0000000  Yes Kathrynn Ducking, MD  aspirin EC 81 MG tablet Take 81 mg by mouth at bedtime.   Yes Historical Provider, MD  carbidopa-levodopa (SINEMET IR) 25-100 MG per tablet TAKE 1 TABLET 3 TIMES A DAY. 09/01/14  Yes Kathrynn Ducking, MD  carbidopa-levodopa (SINEMET IR) 25-250 MG per tablet TAKE  (1)  TABLET  FOUR TIMES DAILY. 05/26/14  Yes Kathrynn Ducking, MD  docusate sodium (COLACE) 100 MG capsule Take 200 mg by mouth at bedtime.    Yes Historical Provider, MD  fish oil-omega-3 fatty  acids 1000 MG capsule Take 1 g by mouth daily with breakfast.    Yes Historical Provider, MD  ibuprofen (ADVIL,MOTRIN) 200 MG tablet Take 400 mg by mouth every 6 (six) hours as needed (pain).   Yes Historical Provider, MD  Multiple Vitamin (MULTIVITAMIN WITH MINERALS) TABS Take 1 tablet by mouth every morning.    Yes Historical Provider, MD  pramipexole (MIRAPEX) 1 MG tablet TAKE 1 TABLET 3 TIMES A DAY. 07/08/14  Yes Kathrynn Ducking, MD  predniSONE (DELTASONE) 5 MG tablet Take 1 tablet by mouth daily. 11/18/14  Yes Historical Provider, MD  ranitidine (ZANTAC) 75 MG tablet Take 75 mg by mouth daily as needed for heartburn (reflux).    Yes Historical Provider, MD  RYTARY 48.75-195 MG  CPCR TAKE 1 CAPSULE DAILY AFTER SUPPER. 09/01/14  Yes Kathrynn Ducking, MD  selegiline (ELDEPRYL) 5 MG tablet Take 1 tablet (5 mg total) by mouth 2 (two) times daily. Takes at breakfast and lunch. 05/04/14  Yes Kathrynn Ducking, MD  triamcinolone (NASACORT) 55 MCG/ACT AERO nasal inhaler Place 2 sprays into the nose as needed (allergies).   Yes Historical Provider, MD  vitamin B-12 (CYANOCOBALAMIN) 500 MCG tablet Take 500 mcg by mouth every morning.    Yes Historical Provider, MD  vitamin D, CHOLECALCIFEROL, 400 UNITS tablet Take 400 Units by mouth every morning.   Yes Historical Provider, MD    ROS:  Out of a complete 14 system review of symptoms, the patient complains only of the following symptoms, and all other reviewed systems are negative.  Difficulty swallowing Cough Daytime sleepiness, snoring  Blood pressure 170/72, pulse 62, height 5\' 11"  (1.803 m), weight 161 lb (73.029 kg).  Physical Exam  General: The patient is alert and cooperative at the time of the examination.  Skin: No significant peripheral edema is noted.   Neurologic Exam  Mental status: The patient is alert and oriented x 3 at the time of the examination. The patient has apparent normal recent and remote memory, with an apparently normal attention span and concentration ability.   Cranial nerves: Facial symmetry is present. Speech is slightly dysarthric, dysphonic. Extraocular movements are full. Visual fields are full, with the exception that there is some restrictive superior gaze.  Motor: The patient has good strength in all 4 extremities.  Sensory examination: Soft touch sensation is symmetric on the face, arms, and legs.  Coordination: The patient has good finger-nose-finger and heel-to-shin bilaterally. There is some apraxia with the use of the lower extremities.  Gait and station: The patient requires some assistance with standing, he has a tendency to lean backwards. He walks with a walker, with the  walker he has good stride, slight hesitancy with turns. Tandem gait was not tested.  Reflexes: Deep tendon reflexes are symmetric.   Assessment/Plan:  1. Parkinson's disease  2. Gait disorder  3. Orthostatic hypotension  The patient has had wide variations in blood pressure, or not using medication for this, he is using TED hose, and drinking Gatorade. The patient is doing well with his ability to ambulate, but he does have a tendency to lean backwards. He does not have a lift chair at home, and he does not wish to have one at this time. We will not readjust his medications for Parkinson's disease at this time, he will follow-up in 4 months.  Jill Alexanders MD 11/19/2014 1:22 PM  [facility] [address] [place], [place] [address]  Phone [phone] Fax [phone]

## 2014-11-19 NOTE — Patient Instructions (Signed)
Fall Prevention in the Home  Falls can cause injuries and can affect people from all age groups. There are many simple things that you can do to make your home safe and to help prevent falls. WHAT CAN I DO ON THE OUTSIDE OF MY HOME?  Regularly repair the edges of walkways and driveways and fix any cracks.  Remove high doorway thresholds.  Trim any shrubbery on the main path into your home.  Use bright outdoor lighting.  Clear walkways of debris and clutter, including tools and rocks.  Regularly check that handrails are securely fastened and in good repair. Both sides of any steps should have handrails.  Install guardrails along the edges of any raised decks or porches.  Have leaves, snow, and ice cleared regularly.  Use sand or salt on walkways during winter months.  In the garage, clean up any spills right away, including grease or oil spills. WHAT CAN I DO IN THE BATHROOM?  Use night lights.  Install grab bars by the toilet and in the tub and shower. Do not use towel bars as grab bars.  Use non-skid mats or decals on the floor of the tub or shower.  If you need to sit down while you are in the shower, use a plastic, non-slip stool..  Keep the floor dry. Immediately clean up any water that spills on the floor.  Remove soap buildup in the tub or shower on a regular basis.  Attach bath mats securely with double-sided non-slip rug tape.  Remove throw rugs and other tripping hazards from the floor. WHAT CAN I DO IN THE BEDROOM?  Use night lights.  Make sure that a bedside light is easy to reach.  Do not use oversized bedding that drapes onto the floor.  Have a firm chair that has side arms to use for getting dressed.  Remove throw rugs and other tripping hazards from the floor. WHAT CAN I DO IN THE KITCHEN?   Clean up any spills right away.  Avoid walking on wet floors.  Place frequently used items in easy-to-reach places.  If you need to reach for something  above you, use a sturdy step stool that has a grab bar.  Keep electrical cables out of the way.  Do not use floor polish or wax that makes floors slippery. If you have to use wax, make sure that it is non-skid floor wax.  Remove throw rugs and other tripping hazards from the floor. WHAT CAN I DO IN THE STAIRWAYS?  Do not leave any items on the stairs.  Make sure that there are handrails on both sides of the stairs. Fix handrails that are broken or loose. Make sure that handrails are as long as the stairways.  Check any carpeting to make sure that it is firmly attached to the stairs. Fix any carpet that is loose or worn.  Avoid having throw rugs at the top or bottom of stairways, or secure the rugs with carpet tape to prevent them from moving.  Make sure that you have a light switch at the top of the stairs and the bottom of the stairs. If you do not have them, have them installed. WHAT ARE SOME OTHER FALL PREVENTION TIPS?  Wear closed-toe shoes that fit well and support your feet. Wear shoes that have rubber soles or low heels.  When you use a stepladder, make sure that it is completely opened and that the sides are firmly locked. Have someone hold the ladder while you   are using it. Do not climb a closed stepladder.  Add color or contrast paint or tape to grab bars and handrails in your home. Place contrasting color strips on the first and last steps.  Use mobility aids as needed, such as canes, walkers, scooters, and crutches.  Turn on lights if it is dark. Replace any light bulbs that burn out.  Set up furniture so that there are clear paths. Keep the furniture in the same spot.  Fix any uneven floor surfaces.  Choose a carpet design that does not hide the edge of steps of a stairway.  Be aware of any and all pets.  Review your medicines with your healthcare provider. Some medicines can cause dizziness or changes in blood pressure, which increase your risk of falling. Talk  with your health care provider about other ways that you can decrease your risk of falls. This may include working with a physical therapist or trainer to improve your strength, balance, and endurance.   This information is not intended to replace advice given to you by your health care provider. Make sure you discuss any questions you have with your health care provider.   Document Released: 12/16/2001 Document Revised: 05/12/2014 Document Reviewed: 01/30/2014 Elsevier Interactive Patient Education 2016 [employer]  

## 2014-11-30 DIAGNOSIS — J329 Chronic sinusitis, unspecified: Secondary | ICD-10-CM | POA: Diagnosis not present

## 2014-12-01 ENCOUNTER — Other Ambulatory Visit: Payer: Self-pay | Admitting: Neurology

## 2014-12-15 DIAGNOSIS — I10 Essential (primary) hypertension: Secondary | ICD-10-CM | POA: Diagnosis not present

## 2014-12-15 DIAGNOSIS — G20 Parkinson's disease: Secondary | ICD-10-CM | POA: Diagnosis not present

## 2014-12-15 DIAGNOSIS — H9190 Unspecified hearing loss, unspecified ear: Secondary | ICD-10-CM | POA: Diagnosis not present

## 2014-12-24 DIAGNOSIS — H903 Sensorineural hearing loss, bilateral: Secondary | ICD-10-CM | POA: Diagnosis not present

## 2015-01-04 ENCOUNTER — Other Ambulatory Visit: Payer: Self-pay | Admitting: Neurology

## 2015-01-22 ENCOUNTER — Other Ambulatory Visit: Payer: Self-pay | Admitting: Geriatric Medicine

## 2015-01-22 DIAGNOSIS — N281 Cyst of kidney, acquired: Secondary | ICD-10-CM

## 2015-01-28 ENCOUNTER — Encounter: Payer: Self-pay | Admitting: Physical Therapy

## 2015-01-28 NOTE — Therapy (Signed)
[facility] [address] [place], [place], [address] Phone: [phone]   Fax:  [phone]  Patient Details  Name: Brian Aguirre MRN: [id number] Date of Birth: 02-06-1929 Referring Provider:  No ref. provider found  Encounter Date: 01/28/2015  PHYSICAL THERAPY DISCHARGE SUMMARY  Visits from Start of Care: 14  Current functional level related to goals / functional outcomes:     PT Short Term Goals - 11/10/13 1127    PT SHORT TERM GOAL #1   Title Pt will be able to perform HEP with wife's supervision, for improved balance, transfers, and gait.   Status Not Met   PT SHORT TERM GOAL #2   Title Pt will perform at least 6 of 10 reps of sit<>stand transfers with minimal to no UE support, for improved safety and efficiency with transfers.   Status Achieved   PT SHORT TERM GOAL #3   Title Pt will improve Berg Balance score to at least 26/56 for decreased fall risk.   Status Achieved  Berg score 32/56   PT SHORT TERM GOAL #4   Title Pt will improve TUG score to less tahn or equal to 45 seconds for decreased fall risk.   Status --  TUG score 32.22 sec   PT SHORT TERM GOAL #5   Title Pt will report at least 25% improvement in bed mobility, including rolling and supine<>sit, for improved functional mobility.   Status Not Met         PT Long Term Goals - 12/08/13 1022    PT LONG TERM GOAL #1   Title Pt will perform progressive HEP with supervision to address balance, strength, gait.   Time 4   Period Weeks   Status New   PT LONG TERM GOAL #2   Title Pt will improve Berg Balance score to at least 41/56 for decreased fall risk   Time 4   Period Weeks   Status New   PT LONG TERM GOAL #3   Title Pt will improve Timed Up and Go score to less than or equal to 20 seconds for decreased fall risk.   Time 4   Period Weeks   Status New  Berg Balance score 36/56   PT LONG TERM GOAL #4   Title verbalize plans for continued  community fitness upon D/C from PT   Time 4   Period Weeks   Status New   PT LONG TERM GOAL #6   Status --   PT LONG TERM GOAL #7   Title --   Status --    Goals not fully able to be addressed due to pt having to cancel remaining appts due to a fall.    Remaining deficits: Balance, bradykinesia    Education / Equipment: HEP  Plan: Patient agrees to discharge.  Patient goals were partially met. Patient is being discharged due to a change in medical status.  ?????      Kele Withem W. 01/28/2015, 1:33 PM [facility] [address] [place], [place], [address] Phone: [phone]   Fax:  [phone]

## 2015-01-29 ENCOUNTER — Ambulatory Visit
Admission: RE | Admit: 2015-01-29 | Discharge: 2015-01-29 | Disposition: A | Discharge status: Home / Self Care | Payer: Medicare Other | Source: Ambulatory Visit | Attending: Geriatric Medicine | Admitting: Geriatric Medicine

## 2015-01-29 DIAGNOSIS — N281 Cyst of kidney, acquired: Secondary | ICD-10-CM

## 2015-01-29 MED ORDER — GADOBENATE DIMEGLUMINE 529 MG/ML IV SOLN
15.0000 mL | Freq: Once | INTRAVENOUS | Status: AC | PRN
  Administered 2015-01-29: 15 mL via INTRAVENOUS

## 2015-02-15 DIAGNOSIS — R21 Rash and other nonspecific skin eruption: Secondary | ICD-10-CM | POA: Diagnosis not present

## 2015-02-15 DIAGNOSIS — D485 Neoplasm of uncertain behavior of skin: Secondary | ICD-10-CM | POA: Diagnosis not present

## 2015-02-15 DIAGNOSIS — L821 Other seborrheic keratosis: Secondary | ICD-10-CM | POA: Diagnosis not present

## 2015-02-15 DIAGNOSIS — Z85828 Personal history of other malignant neoplasm of skin: Secondary | ICD-10-CM | POA: Diagnosis not present

## 2015-02-17 ENCOUNTER — Other Ambulatory Visit: Payer: Self-pay | Admitting: Neurology

## 2015-02-23 DIAGNOSIS — H5022 Vertical strabismus, left eye: Secondary | ICD-10-CM | POA: Diagnosis not present

## 2015-03-09 DIAGNOSIS — L603 Nail dystrophy: Secondary | ICD-10-CM | POA: Diagnosis not present

## 2015-03-09 DIAGNOSIS — Z85828 Personal history of other malignant neoplasm of skin: Secondary | ICD-10-CM | POA: Diagnosis not present

## 2015-03-15 ENCOUNTER — Other Ambulatory Visit: Payer: Self-pay | Admitting: Neurology

## 2015-03-16 ENCOUNTER — Ambulatory Visit (INDEPENDENT_AMBULATORY_CARE_PROVIDER_SITE_OTHER): Payer: Medicare Other | Admitting: Neurology

## 2015-03-16 ENCOUNTER — Encounter: Payer: Self-pay | Admitting: Neurology

## 2015-03-16 ENCOUNTER — Other Ambulatory Visit: Payer: Self-pay | Admitting: Neurology

## 2015-03-16 VITALS — BP 130/68 | HR 64 | Resp 20 | Ht 71.0 in | Wt 150.0 lb

## 2015-03-16 DIAGNOSIS — R42 Dizziness and giddiness: Secondary | ICD-10-CM | POA: Diagnosis not present

## 2015-03-16 DIAGNOSIS — I951 Orthostatic hypotension: Secondary | ICD-10-CM

## 2015-03-16 DIAGNOSIS — R269 Unspecified abnormalities of gait and mobility: Secondary | ICD-10-CM

## 2015-03-16 DIAGNOSIS — G20 Parkinson's disease: Secondary | ICD-10-CM

## 2015-03-16 NOTE — Patient Instructions (Addendum)
Try taking midodrine in the morning for 2 weeks, compare notes with the blood pressure.   Fall Prevention in the Home  Falls can cause injuries and can affect people from all age groups. There are many simple things that you can do to make your home safe and to help prevent falls. WHAT CAN I DO ON THE OUTSIDE OF MY HOME?  Regularly repair the edges of walkways and driveways and fix any cracks.  Remove high doorway thresholds.  Trim any shrubbery on the main path into your home.  Use bright outdoor lighting.  Clear walkways of debris and clutter, including tools and rocks.  Regularly check that handrails are securely fastened and in good repair. Both sides of any steps should have handrails.  Install guardrails along the edges of any raised decks or porches.  Have leaves, snow, and ice cleared regularly.  Use sand or salt on walkways during winter months.  In the garage, clean up any spills right away, including grease or oil spills. WHAT CAN I DO IN THE BATHROOM?  Use night lights.  Install grab bars by the toilet and in the tub and shower. Do not use towel bars as grab bars.  Use non-skid mats or decals on the floor of the tub or shower.  If you need to sit down while you are in the shower, use a plastic, non-slip stool.Marland Kitchen  Keep the floor dry. Immediately clean up any water that spills on the floor.  Remove soap buildup in the tub or shower on a regular basis.  Attach bath mats securely with double-sided non-slip rug tape.  Remove throw rugs and other tripping hazards from the floor. WHAT CAN I DO IN THE BEDROOM?  Use night lights.  Make sure that a bedside light is easy to reach.  Do not use oversized bedding that drapes onto the floor.  Have a firm chair that has side arms to use for getting dressed.  Remove throw rugs and other tripping hazards from the floor. WHAT CAN I DO IN THE KITCHEN?   Clean up any spills right away.  Avoid walking on wet  floors.  Place frequently used items in easy-to-reach places.  If you need to reach for something above you, use a sturdy step stool that has a grab bar.  Keep electrical cables out of the way.  Do not use floor polish or wax that makes floors slippery. If you have to use wax, make sure that it is non-skid floor wax.  Remove throw rugs and other tripping hazards from the floor. WHAT CAN I DO IN THE STAIRWAYS?  Do not leave any items on the stairs.  Make sure that there are handrails on both sides of the stairs. Fix handrails that are broken or loose. Make sure that handrails are as long as the stairways.  Check any carpeting to make sure that it is firmly attached to the stairs. Fix any carpet that is loose or worn.  Avoid having throw rugs at the top or bottom of stairways, or secure the rugs with carpet tape to prevent them from moving.  Make sure that you have a light switch at the top of the stairs and the bottom of the stairs. If you do not have them, have them installed. WHAT ARE SOME OTHER FALL PREVENTION TIPS?  Wear closed-toe shoes that fit well and support your feet. Wear shoes that have rubber soles or low heels.  When you use a stepladder, make sure that  it is completely opened and that the sides are firmly locked. Have someone hold the ladder while you are using it. Do not climb a closed stepladder.  Add color or contrast paint or tape to grab bars and handrails in your home. Place contrasting color strips on the first and last steps.  Use mobility aids as needed, such as canes, walkers, scooters, and crutches.  Turn on lights if it is dark. Replace any light bulbs that burn out.  Set up furniture so that there are clear paths. Keep the furniture in the same spot.  Fix any uneven floor surfaces.  Choose a carpet design that does not hide the edge of steps of a stairway.  Be aware of any and all pets.  Review your medicines with your healthcare provider. Some  medicines can cause dizziness or changes in blood pressure, which increase your risk of falling. Talk with your health care provider about other ways that you can decrease your risk of falls. This may include working with a physical therapist or trainer to improve your strength, balance, and endurance.   This information is not intended to replace advice given to you by your health care provider. Make sure you discuss any questions you have with your health care provider.   Document Released: 12/16/2001 Document Revised: 05/12/2014 Document Reviewed: 01/30/2014 Elsevier Interactive Patient Education [employer].

## 2015-03-16 NOTE — Progress Notes (Signed)
Reason for visit: Parkinson's disease  Brian Aguirre is an 82 y.o. male  History of present illness:  Brian Aguirre is an 82 year old right-handed white male with a history of Parkinson's disease. The patient has had slow progression of his underlying disease, he remains ambulatory, but he does have a tendency to lean backwards. He has not had any falls, he uses a walker for ambulation. He has had significant issues with orthostatic hypotension. He will generally wake up in the morning with a normal blood pressure, once again he will be orthostatic around breakfast, his blood pressure may drop out to the [id number] to [id number] systolic range, the patient will feel cloudy headed, they will get him to lie back in a recliner, have him drink Gatorade, usually within a couple hours he recovers. The patient denies any problems with blood pressure later in the evening. The patient has had some ongoing issues with constipation, he may go 5 days without having a bowel movement. He uses Amitiza and Dulcolax. He sleeps well at night. He has minimal tremors, occasionally he will have tremor of the jaw. He has a tendency to lean to the right with sitting.  Past Medical History  Diagnosis Date  . Parkinson disease ([place])   . Osteoporosis   . Dizziness   . Depression   . Hypertension   . Gait disturbance   . Benign enlargement of prostate   . Orthostatic hypotension   . Fracture, rib   . Memory loss   . Chronic constipation 02/26/2014  . History of fall 03/09/2014  . Contusion of forehead 03/09/2014  . Memory difficulties 07/20/2014    Past Surgical History  Procedure Laterality Date  . Appendectomy    . Tonsillectomy    . Hernia repair    . Cataract extraction Left     Family History  Problem Relation Age of Onset  . Cancer Mother   . Heart failure Father   . Breast cancer Daughter     Social history:  reports that he has never smoked. He has never used smokeless tobacco. He reports that he does not  drink alcohol or use illicit drugs.    Allergies  Allergen Reactions  . Myrbetriq [Mirabegron] Nausea And Vomiting  . Stalevo [Carbidopa-Levodopa-Entacapone] Diarrhea    GI upset    Medications:  Prior to Admission medications   Medication Sig Start Date End Date Taking? Authorizing Provider  acetaminophen (TYLENOL) 500 MG tablet Take 1,000 mg by mouth every 6 (six) hours as needed for mild pain.   Yes Historical Provider, MD  AMITIZA 24 MCG capsule TAKE 1 CAPSULE TWICE DAILY WITH MEALS. 01/04/15  Yes Kathrynn Ducking, MD  amLODipine (NORVASC) 2.5 MG tablet Take 1 tablet (2.5 mg total) by mouth daily as needed. Use for persistent blood pressure > 99991111 systolic 0000000  Yes Kathrynn Ducking, MD  aspirin EC 81 MG tablet Take 81 mg by mouth at bedtime.   Yes Historical Provider, MD  carbidopa-levodopa (SINEMET IR) 25-100 MG tablet TAKE 1 TABLET 3 TIMES A DAY. 01/04/15  Yes Kathrynn Ducking, MD  carbidopa-levodopa (SINEMET IR) 25-250 MG tablet TAKE  (1)  TABLET  FOUR TIMES DAILY. 02/17/15  Yes Kathrynn Ducking, MD  docusate sodium (COLACE) 100 MG capsule Take 200 mg by mouth at bedtime.    Yes Historical Provider, MD  fish oil-omega-3 fatty acids 1000 MG capsule Take 1 g by mouth daily with breakfast.    Yes Historical Provider, MD  ibuprofen (ADVIL,MOTRIN) 200 MG tablet Take 400 mg by mouth every 6 (six) hours as needed (pain).   Yes Historical Provider, MD  Multiple Vitamin (MULTIVITAMIN WITH MINERALS) TABS Take 1 tablet by mouth every morning.    Yes Historical Provider, MD  pramipexole (MIRAPEX) 1 MG tablet TAKE 1 TABLET 3 TIMES A DAY. 01/04/15  Yes Kathrynn Ducking, MD  predniSONE (DELTASONE) 5 MG tablet Take 1 tablet by mouth daily. 11/18/14  Yes Historical Provider, MD  ranitidine (ZANTAC) 75 MG tablet Take 75 mg by mouth daily as needed for heartburn (reflux).    Yes Historical Provider, MD  RYTARY [id number] MG CPCR TAKE 1 CAPSULE DAILY AFTER SUPPER. 12/01/14  Yes Kathrynn Ducking, MD    selegiline (ELDEPRYL) 5 MG capsule TAKE 1 CAPSULE TWICE DAILY WITH BREAKFAST AND LUNCH. 02/17/15  Yes Kathrynn Ducking, MD  triamcinolone (NASACORT) 55 MCG/ACT AERO nasal inhaler Place 2 sprays into the nose as needed (allergies).   Yes Historical Provider, MD  vitamin B-12 (CYANOCOBALAMIN) 500 MCG tablet Take 500 mcg by mouth every morning.    Yes Historical Provider, MD  vitamin D, CHOLECALCIFEROL, 400 UNITS tablet Take 400 Units by mouth every morning.   Yes Historical Provider, MD    ROS:  Out of a complete 14 system review of symptoms, the patient complains only of the following symptoms, and all other reviewed systems are negative.  Fatigue Drooling Double vision Cough Constipation Daytime sleepiness Urinary urgency Dizziness, weakness Hallucinations  Blood pressure 130/68, pulse 64, resp. rate 20, height 5\' 11"  (1.803 m), weight 150 lb (68.04 kg).  Physical Exam  General: The patient is alert and cooperative at the time of the examination.  Skin: No significant peripheral edema is noted.   Neurologic Exam  Mental status: The patient is alert and oriented x 3 at the time of the examination. The patient has apparent normal recent and remote memory, with an apparently normal attention span and concentration ability.   Cranial nerves: Facial symmetry is present. Speech is normal, no aphasia or dysarthria is noted. Extraocular movements are full. Visual fields are full. Masking of the face is seen. Occasionally, a jaw tremor is noted.  Motor: The patient has good strength in all 4 extremities.  Sensory examination: Soft touch sensation is symmetric on the face, arms, and legs.  Coordination: The patient has good finger-nose-finger and heel-to-shin bilaterally. Some apraxia seen with the use of the extremities.  Gait and station: The patient requires assistance with standing. He has a tendency to lean backwards until he takes hold of the walker. The patient is able to and  leg independently with a walker, slow gait, stable turns.  Reflexes: Deep tendon reflexes are symmetric.   Assessment/Plan:  1. Parkinson's disease  2. Gait disturbance  3. Chronic constipation  4. Orthostatic hypotension  The patient has midodrine at home, he is to use a 2.5 mg tablet in the morning to see if this helps the symptoms of hypotension around breakfast time. The patient will continue his current dosing of Sinemet, he will follow-up in about 4 months. He uses compression stockings, but he probably needs to use the mid thigh length stockings.  Jill Alexanders MD 03/16/2015 7:04 PM  [facility] [address] [place],  [address]  Phone [phone] Fax [phone]

## 2015-03-19 ENCOUNTER — Ambulatory Visit: Payer: Medicare Other | Admitting: Neurology

## 2015-04-22 DIAGNOSIS — D692 Other nonthrombocytopenic purpura: Secondary | ICD-10-CM | POA: Diagnosis not present

## 2015-04-22 DIAGNOSIS — L570 Actinic keratosis: Secondary | ICD-10-CM | POA: Diagnosis not present

## 2015-04-22 DIAGNOSIS — C44612 Basal cell carcinoma of skin of right upper limb, including shoulder: Secondary | ICD-10-CM | POA: Diagnosis not present

## 2015-04-22 DIAGNOSIS — D225 Melanocytic nevi of trunk: Secondary | ICD-10-CM | POA: Diagnosis not present

## 2015-04-22 DIAGNOSIS — D0422 Carcinoma in situ of skin of left ear and external auricular canal: Secondary | ICD-10-CM | POA: Diagnosis not present

## 2015-04-22 DIAGNOSIS — D485 Neoplasm of uncertain behavior of skin: Secondary | ICD-10-CM | POA: Diagnosis not present

## 2015-04-22 DIAGNOSIS — D1801 Hemangioma of skin and subcutaneous tissue: Secondary | ICD-10-CM | POA: Diagnosis not present

## 2015-04-22 DIAGNOSIS — Z85828 Personal history of other malignant neoplasm of skin: Secondary | ICD-10-CM | POA: Diagnosis not present

## 2015-04-28 DIAGNOSIS — J301 Allergic rhinitis due to pollen: Secondary | ICD-10-CM | POA: Diagnosis not present

## 2015-04-28 DIAGNOSIS — I10 Essential (primary) hypertension: Secondary | ICD-10-CM | POA: Diagnosis not present

## 2015-04-28 DIAGNOSIS — G20 Parkinson's disease: Secondary | ICD-10-CM | POA: Diagnosis not present

## 2015-04-28 DIAGNOSIS — I951 Orthostatic hypotension: Secondary | ICD-10-CM | POA: Diagnosis not present

## 2015-05-03 ENCOUNTER — Other Ambulatory Visit: Payer: Self-pay | Admitting: Neurology

## 2015-05-03 ENCOUNTER — Telehealth: Payer: Self-pay | Admitting: Neurology

## 2015-05-03 NOTE — Telephone Encounter (Signed)
Pt's wife called sts the handicap placard will expire 05/10/15. Please fill out form and mail to pt. Thanks

## 2015-05-04 NOTE — Telephone Encounter (Signed)
Form signed and placed in outgoing mail.

## 2015-05-04 NOTE — Telephone Encounter (Signed)
Application completed, awaiting MD signature.

## 2015-05-06 NOTE — Telephone Encounter (Signed)
Pt's wife called inquiring about placard. Operator advised it had been mailed and she should be receiving it tomorrow or Saturday. She was appreciative

## 2015-05-18 DIAGNOSIS — H903 Sensorineural hearing loss, bilateral: Secondary | ICD-10-CM | POA: Diagnosis not present

## 2015-05-18 DIAGNOSIS — G20 Parkinson's disease: Secondary | ICD-10-CM | POA: Diagnosis not present

## 2015-05-19 ENCOUNTER — Other Ambulatory Visit: Payer: Self-pay | Admitting: Otolaryngology

## 2015-05-19 DIAGNOSIS — H903 Sensorineural hearing loss, bilateral: Secondary | ICD-10-CM

## 2015-05-25 DIAGNOSIS — D0422 Carcinoma in situ of skin of left ear and external auricular canal: Secondary | ICD-10-CM | POA: Diagnosis not present

## 2015-05-25 DIAGNOSIS — L570 Actinic keratosis: Secondary | ICD-10-CM | POA: Diagnosis not present

## 2015-05-25 DIAGNOSIS — Z85828 Personal history of other malignant neoplasm of skin: Secondary | ICD-10-CM | POA: Diagnosis not present

## 2015-05-28 ENCOUNTER — Other Ambulatory Visit: Payer: Medicare Other

## 2015-05-31 ENCOUNTER — Other Ambulatory Visit: Payer: Self-pay | Admitting: Neurology

## 2015-06-06 DIAGNOSIS — R031 Nonspecific low blood-pressure reading: Secondary | ICD-10-CM | POA: Diagnosis not present

## 2015-06-09 ENCOUNTER — Telehealth: Payer: Self-pay | Admitting: Neurology

## 2015-06-09 NOTE — Telephone Encounter (Signed)
Pt's daughter called said on Sunday he had episodes of low BP 57/37, incoherent and unresponsive. EMS was called, BP went back up and it was determined he did not need to be transported to ED.  EMS told her it could be a connection between sinemet and carbs. She doesn't think he is getting enough hydration or sodium in the morning. She is requesting Dr Jannifer Franklin to call her. Pt does not have a DPR, she sts she has talked with DR Jannifer Franklin numerous times.

## 2015-06-09 NOTE — Telephone Encounter (Signed)
I called the daughter Eustaquio Maize, [phone].  I called the patient, talk with the daughter. The patient had an episode of low blood pressure 3 days ago. The patient will often be low in the morning around breakfast time, and the elevated up to A999333 systolic in the evenings. The patient may liberalize salt in the morning taking Gatorade and increase fluid intake with his morning medications. If the blood pressure drops, getting his head down rapidly should be done, getting his feet up. Blood pressure stabilization has been quite difficult.

## 2015-06-16 DIAGNOSIS — J301 Allergic rhinitis due to pollen: Secondary | ICD-10-CM | POA: Diagnosis not present

## 2015-06-16 DIAGNOSIS — J069 Acute upper respiratory infection, unspecified: Secondary | ICD-10-CM | POA: Diagnosis not present

## 2015-06-17 ENCOUNTER — Telehealth: Payer: Self-pay | Admitting: Neurology

## 2015-06-17 NOTE — Telephone Encounter (Signed)
I called patient. The patient went to the dentist 2 days ago, the next day he awakened with a sore throat, he has had some increased cough and congestion over the last week or 2, but this has gotten worse with a sore throat. No overt fevers have been noted. The patient was seen by the primary care physician, and placed on a Z-Pak. He is feeling some better this afternoon. I will watch the swallowing issues over the next several days, if they persist or worsen, will need to evaluate him further. The patient does not have any other focal numbness or weakness, double vision or loss of vision a he reports some general fatigue issues over the last 2 weeks.

## 2015-06-17 NOTE — Telephone Encounter (Signed)
[name] is calling stating the patient is having intermittent trouble swallowing x 2 days. The patient had dental work this past Tuesday and his mouth was open off and on for 2 hours. His throat started hurting yesterday and went to see Dr. Deforest Hoyles and was put on medication Zithromycin. Please call to discuss the problems with swallowing.

## 2015-06-21 NOTE — Telephone Encounter (Signed)
Patient is calling back and states he is feeling worse today and wonders if he needs to come in to see [name].  Please call.

## 2015-06-21 NOTE — Telephone Encounter (Signed)
Spoke to patient - he tells me when he called this morning, he left a message stating he was feeling better - not worse.  He finished his zithromax today.  He will call back with any further concerns.

## 2015-07-06 DIAGNOSIS — L03032 Cellulitis of left toe: Secondary | ICD-10-CM | POA: Diagnosis not present

## 2015-07-06 DIAGNOSIS — M79672 Pain in left foot: Secondary | ICD-10-CM | POA: Diagnosis not present

## 2015-07-22 DIAGNOSIS — T8189XA Other complications of procedures, not elsewhere classified, initial encounter: Secondary | ICD-10-CM | POA: Diagnosis not present

## 2015-07-23 ENCOUNTER — Ambulatory Visit (INDEPENDENT_AMBULATORY_CARE_PROVIDER_SITE_OTHER): Payer: Medicare Other | Admitting: Neurology

## 2015-07-23 ENCOUNTER — Encounter: Payer: Self-pay | Admitting: Neurology

## 2015-07-23 VITALS — BP 158/70 | HR 60 | Ht 71.0 in | Wt 155.5 lb

## 2015-07-23 DIAGNOSIS — G20 Parkinson's disease: Secondary | ICD-10-CM | POA: Diagnosis not present

## 2015-07-23 DIAGNOSIS — R269 Unspecified abnormalities of gait and mobility: Secondary | ICD-10-CM

## 2015-07-23 DIAGNOSIS — I951 Orthostatic hypotension: Secondary | ICD-10-CM | POA: Diagnosis not present

## 2015-07-23 NOTE — Patient Instructions (Signed)
   REduce the sinemet dose 25/100 to one in the morning, 1/2 at midday and evening, could stop the evening dose in 3 weeks if he is tolerating the dose reduction.

## 2015-07-23 NOTE — Progress Notes (Signed)
Reason for visit: Parkinson's disease  Brian Aguirre is an 82 y.o. male  History of present illness:  [name] is an 82 year old right-handed male with a history of Parkinson's disease. The patient has significant issues with blood pressure instability, orthostatic hypotension. The patient usually has low blood pressures in the morning, he may have high blood pressures in the afternoon or evening. The patient has had occasional episodes of syncope associated with a dropout in blood pressure. This last happened about one month ago. The patient has reports of fatigue at times. The patient is having increasing issues with dyskinesias over the last 6 months. He denies any significant problems with choking with swallowing, he remains quite active during the day and walks frequently with his walker. He denies any falls. He has some difficulty arising from a seated position, at times he has a tendency to lean backwards. The patient returns to this office for an evaluation. The dyskinesias oftentimes are somewhat worse in the afternoon or evening.  Past Medical History  Diagnosis Date  . Parkinson disease ([place])   . Osteoporosis   . Dizziness   . Depression   . Hypertension   . Gait disturbance   . Benign enlargement of prostate   . Orthostatic hypotension   . Fracture, rib   . Memory loss   . Chronic constipation 02/26/2014  . History of fall 03/09/2014  . Contusion of forehead 03/09/2014  . Memory difficulties 07/20/2014    Past Surgical History  Procedure Laterality Date  . Appendectomy    . Tonsillectomy    . Hernia repair    . Cataract extraction Left     Family History  Problem Relation Age of Onset  . Cancer Mother   . Heart failure Father   . Breast cancer Daughter     Social history:  reports that he has never smoked. He has never used smokeless tobacco. He reports that he does not drink alcohol or use illicit drugs.    Allergies  Allergen Reactions  . Myrbetriq  [Mirabegron] Nausea And Vomiting  . Stalevo [Carbidopa-Levodopa-Entacapone] Diarrhea    GI upset    Medications:  Prior to Admission medications   Medication Sig Start Date End Date Taking? Authorizing Provider  acetaminophen (TYLENOL) 500 MG tablet Take 1,000 mg by mouth every 6 (six) hours as needed for mild pain.   Yes Historical Provider, MD  AMITIZA 24 MCG capsule TAKE 1 CAPSULE TWICE DAILY WITH MEALS. 06/01/15  Yes Kathrynn Ducking, MD  amLODipine (NORVASC) 2.5 MG tablet Take 1 tablet (2.5 mg total) by mouth daily as needed. Use for persistent blood pressure > 99991111 systolic 0000000  Yes Kathrynn Ducking, MD  aspirin EC 81 MG tablet Take 81 mg by mouth at bedtime.   Yes Historical Provider, MD  carbidopa-levodopa (SINEMET IR) 25-100 MG tablet TAKE 1 TABLET 3 TIMES A DAY. 06/01/15  Yes Kathrynn Ducking, MD  carbidopa-levodopa (SINEMET IR) 25-250 MG tablet TAKE  (1)  TABLET  FOUR TIMES DAILY. 02/17/15  Yes Kathrynn Ducking, MD  docusate sodium (COLACE) 100 MG capsule Take 200 mg by mouth at bedtime.    Yes Historical Provider, MD  fish oil-omega-3 fatty acids 1000 MG capsule Take 1 g by mouth daily with breakfast.    Yes Historical Provider, MD  ibuprofen (ADVIL,MOTRIN) 200 MG tablet Take 400 mg by mouth every 6 (six) hours as needed (pain).   Yes Historical Provider, MD  Multiple Vitamin (MULTIVITAMIN WITH  MINERALS) TABS Take 1 tablet by mouth every morning.    Yes Historical Provider, MD  pramipexole (MIRAPEX) 1 MG tablet TAKE 1 TABLET 3 TIMES A DAY. 06/01/15  Yes Kathrynn Ducking, MD  predniSONE (DELTASONE) 5 MG tablet Take 1 tablet by mouth daily. 11/18/14  Yes Historical Provider, MD  ranitidine (ZANTAC) 75 MG tablet Take 75 mg by mouth daily as needed for heartburn (reflux).    Yes Historical Provider, MD  RYTARY [phone] MG CPCR TAKE 1 CAPSULE DAILY AFTER SUPPER. 06/01/15  Yes Kathrynn Ducking, MD  selegiline (ELDEPRYL) 5 MG capsule TAKE 1 CAPSULE TWICE DAILY WITH BREAKFAST AND LUNCH. 02/17/15   Yes Kathrynn Ducking, MD  triamcinolone (NASACORT) 55 MCG/ACT AERO nasal inhaler Place 2 sprays into the nose as needed (allergies).   Yes Historical Provider, MD  vitamin B-12 (CYANOCOBALAMIN) 500 MCG tablet Take 500 mcg by mouth every morning.    Yes Historical Provider, MD  vitamin D, CHOLECALCIFEROL, 400 UNITS tablet Take 400 Units by mouth every morning.   Yes Historical Provider, MD    ROS:  Out of a complete 14 system review of symptoms, the patient complains only of the following symptoms, and all other reviewed systems are negative.  Hearing loss, drooling Double vision, blurred vision Cough Constipation Daytime sleepiness, snoring Urinary urgency Back pain Skin wounds Bruising easily Dizziness, passing out Occasional hallucinations  Blood pressure 158/70, pulse 60, height 5\' 11"  (1.803 m), weight 155 lb 8 oz (70.534 kg).  Physical Exam  General: The patient is alert and cooperative at the time of the examination.  Skin: No significant peripheral edema is noted.   Neurologic Exam  Mental status: The patient is alert and oriented x 3 at the time of the examination. The patient has apparent normal recent and remote memory, with an apparently normal attention span and concentration ability.   Cranial nerves: Facial symmetry is present. Speech is normal, no aphasia or dysarthria is noted. Speech is somewhat dysphonic. Extraocular movements are full. Visual fields are full. Masking of the face is seen.  Motor: The patient has good strength in all 4 extremities.  Sensory examination: Soft touch sensation is symmetric on the face, arms, and legs.  Coordination: The patient has good finger-nose-finger and heel-to-shin bilaterally. Prominent dyskinesias are seen with the legs, axial muscles.  Gait and station: The patient requires some assistance with standing from a chair. Once up, he is able to ambulate quite well with a walker, he has good stride, turns. Tandem gait  was not attempted. Romberg is negative. No drift is seen.  Reflexes: Deep tendon reflexes are symmetric.   Assessment/Plan:  1. Parkinson's disease  2. Gait disturbance  3. Orthostatic hypotension  4. Mild memory disturbance  5. Dyskinesias  The patient is having increasing problems with dyskinesias over the last several months. We will cut back on the Sinemet 25/100 mg tablet taking one full tablet in the morning, half a tablet at midday and evening for 3 weeks, then try to eliminate the evening dose of the Sinemet altogether. He will remain on the same dose of the 25/250 mg Sinemet. He will follow-up in 4 months  C. Floyde Parkins MD 07/23/2015 3:13 PM  [facility] [address] [place], [place] [address]  Phone [phone] Fax [phone]

## 2015-08-06 DIAGNOSIS — N481 Balanitis: Secondary | ICD-10-CM | POA: Diagnosis not present

## 2015-08-11 DIAGNOSIS — L03032 Cellulitis of left toe: Secondary | ICD-10-CM | POA: Diagnosis not present

## 2015-08-12 ENCOUNTER — Telehealth: Payer: Self-pay | Admitting: Neurology

## 2015-08-12 NOTE — Telephone Encounter (Signed)
[name] is calling. She would like to know how to decrease the amount of carbidopa-levodopa (SINEMET IR) 25-100 MG tablet for the patient. Please call and advise.

## 2015-08-12 NOTE — Telephone Encounter (Signed)
Per [name] office note: "We will cut back on the Sinemet 25/100 mg tablet taking one full tablet in the morning, half a tablet at midday and evening for 3 weeks, then try to eliminate the evening dose of the Sinemet altogether. He will remain on the same dose of the 25/250 mg Sinemet."  Returned call to pt/wife but no answer. Left VM mssg to call back in a.m.

## 2015-08-13 NOTE — Telephone Encounter (Signed)
Called back and spoke to pt's wife. Reviewed medication instructions which Mrs. Caleb wrote down. She verbalized understanding and appreciation for call. Agreed to call back w/ any additional questions/concerns.

## 2015-08-25 DIAGNOSIS — L821 Other seborrheic keratosis: Secondary | ICD-10-CM | POA: Diagnosis not present

## 2015-08-25 DIAGNOSIS — D485 Neoplasm of uncertain behavior of skin: Secondary | ICD-10-CM | POA: Diagnosis not present

## 2015-08-25 DIAGNOSIS — Z85828 Personal history of other malignant neoplasm of skin: Secondary | ICD-10-CM | POA: Diagnosis not present

## 2015-09-08 NOTE — Telephone Encounter (Addendum)
Returned call - spoke to patient and wife - they both verbalized understanding that Sinemet is an ongoing medication for him.

## 2015-09-08 NOTE — Telephone Encounter (Signed)
Pt's wife called in wanting to know when she can take her husband off the medication now. She wanted clarification on the instructions. How long does he take full tablet in the morning and half tablet midday . Please call [phone]

## 2015-09-30 ENCOUNTER — Other Ambulatory Visit: Payer: Self-pay | Admitting: Neurology

## 2015-10-11 DIAGNOSIS — G20 Parkinson's disease: Secondary | ICD-10-CM | POA: Diagnosis not present

## 2015-10-11 DIAGNOSIS — Z1389 Encounter for screening for other disorder: Secondary | ICD-10-CM | POA: Diagnosis not present

## 2015-10-11 DIAGNOSIS — I10 Essential (primary) hypertension: Secondary | ICD-10-CM | POA: Diagnosis not present

## 2015-10-11 DIAGNOSIS — Z79899 Other long term (current) drug therapy: Secondary | ICD-10-CM | POA: Diagnosis not present

## 2015-10-11 DIAGNOSIS — J309 Allergic rhinitis, unspecified: Secondary | ICD-10-CM | POA: Diagnosis not present

## 2015-10-11 DIAGNOSIS — R0982 Postnasal drip: Secondary | ICD-10-CM | POA: Diagnosis not present

## 2015-10-11 DIAGNOSIS — D696 Thrombocytopenia, unspecified: Secondary | ICD-10-CM | POA: Diagnosis not present

## 2015-10-11 DIAGNOSIS — E222 Syndrome of inappropriate secretion of antidiuretic hormone: Secondary | ICD-10-CM | POA: Diagnosis not present

## 2015-10-11 DIAGNOSIS — Z23 Encounter for immunization: Secondary | ICD-10-CM | POA: Diagnosis not present

## 2015-10-11 DIAGNOSIS — Z0000 Encounter for general adult medical examination without abnormal findings: Secondary | ICD-10-CM | POA: Diagnosis not present

## 2015-11-23 DIAGNOSIS — D485 Neoplasm of uncertain behavior of skin: Secondary | ICD-10-CM | POA: Diagnosis not present

## 2015-11-23 DIAGNOSIS — D692 Other nonthrombocytopenic purpura: Secondary | ICD-10-CM | POA: Diagnosis not present

## 2015-11-23 DIAGNOSIS — L570 Actinic keratosis: Secondary | ICD-10-CM | POA: Diagnosis not present

## 2015-11-23 DIAGNOSIS — D0421 Carcinoma in situ of skin of right ear and external auricular canal: Secondary | ICD-10-CM | POA: Diagnosis not present

## 2015-11-23 DIAGNOSIS — Z85828 Personal history of other malignant neoplasm of skin: Secondary | ICD-10-CM | POA: Diagnosis not present

## 2015-11-24 DIAGNOSIS — J301 Allergic rhinitis due to pollen: Secondary | ICD-10-CM | POA: Diagnosis not present

## 2015-11-24 DIAGNOSIS — R05 Cough: Secondary | ICD-10-CM | POA: Diagnosis not present

## 2015-11-30 ENCOUNTER — Ambulatory Visit (INDEPENDENT_AMBULATORY_CARE_PROVIDER_SITE_OTHER): Payer: Medicare Other | Admitting: Neurology

## 2015-11-30 ENCOUNTER — Encounter: Payer: Self-pay | Admitting: Neurology

## 2015-11-30 VITALS — BP 124/68 | HR 64 | Ht 71.0 in | Wt 154.0 lb

## 2015-11-30 DIAGNOSIS — G909 Disorder of the autonomic nervous system, unspecified: Secondary | ICD-10-CM

## 2015-11-30 DIAGNOSIS — R269 Unspecified abnormalities of gait and mobility: Secondary | ICD-10-CM

## 2015-11-30 DIAGNOSIS — I951 Orthostatic hypotension: Secondary | ICD-10-CM

## 2015-11-30 DIAGNOSIS — G20 Parkinson's disease: Secondary | ICD-10-CM | POA: Diagnosis not present

## 2015-11-30 NOTE — Progress Notes (Signed)
Reason for visit: Parkinson's disease  Brian Aguirre is an 82 y.o. male  History of present illness:  Brian Aguirre is an 82 year old right-handed white male with a history of Parkinson's disease. The patient has had some episodes of orthostatic hypotension, but lately this has not been a significant issue for him. The patient has been taking Gatorade in the morning to help keep up his blood pressure. At times, the blood pressure may go too high, but if he rests for a few moments the blood pressure will normalize. The patient may have some good and bad days with fatigue in the first half of the day, he does not clearly relate this to problem with lack of sleep the night before. The patient has been leaning to the right, he has difficulty getting up out of a chair. He does not have a lift chair at home. The patient is remaining active, he has a Physiological scientist that works with him twice a week. The patient has not had any falls, he walks with a walker. He returns for an evaluation.   Past Medical History:  Diagnosis Date  . Benign enlargement of prostate   . Chronic constipation 02/26/2014  . Contusion of forehead 03/09/2014  . Depression   . Dizziness   . Fracture, rib   . Gait disturbance   . History of fall 03/09/2014  . Hypertension   . Memory difficulties 07/20/2014  . Memory loss   . Orthostatic hypotension   . Osteoporosis   . Parkinson disease [facility])     Past Surgical History:  Procedure Laterality Date  . APPENDECTOMY    . CATARACT EXTRACTION Left   . HERNIA REPAIR    . TONSILLECTOMY      Family History  Problem Relation Age of Onset  . Cancer Mother   . Heart failure Father   . Breast cancer Daughter     Social history:  reports that he has never smoked. He has never used smokeless tobacco. He reports that he does not drink alcohol or use drugs.    Allergies  Allergen Reactions  . Myrbetriq [Mirabegron] Nausea And Vomiting  . Stalevo  [Carbidopa-Levodopa-Entacapone] Diarrhea    GI upset    Medications:  Prior to Admission medications   Medication Sig Start Date End Date Taking? Authorizing Provider  acetaminophen (TYLENOL) 500 MG tablet Take 1,000 mg by mouth every 6 (six) hours as needed for mild pain.   Yes Historical Provider, MD  AMITIZA 24 MCG capsule TAKE 1 CAPSULE TWICE DAILY WITH MEALS. 06/01/15  Yes Brian Ducking, MD  amLODipine (NORVASC) 2.5 MG tablet Take 1 tablet (2.5 mg total) by mouth daily as needed. Use for persistent blood pressure > 99991111 systolic 0000000  Yes Brian Ducking, MD  aspirin EC 81 MG tablet Take 81 mg by mouth at bedtime.   Yes Historical Provider, MD  carbidopa-levodopa (SINEMET IR) 25-100 MG tablet TAKE 1 TABLET 3 TIMES A DAY. Patient taking differently: TAKE 1 TABLET in the morning and 1/2 tablet at midday and evening 06/01/15  Yes Brian Ducking, MD  docusate sodium (COLACE) 100 MG capsule Take 200 mg by mouth at bedtime.    Yes Historical Provider, MD  fish oil-omega-3 fatty acids 1000 MG capsule Take 1 g by mouth daily with breakfast.    Yes Historical Provider, MD  ibuprofen (ADVIL,MOTRIN) 200 MG tablet Take 400 mg by mouth every 6 (six) hours as needed (pain).   Yes Historical Provider,  MD  Multiple Vitamin (MULTIVITAMIN WITH MINERALS) TABS Take 1 tablet by mouth every morning.    Yes Historical Provider, MD  pramipexole (MIRAPEX) 1 MG tablet TAKE 1 TABLET 3 TIMES A DAY. 06/01/15  Yes Brian Ducking, MD  predniSONE (DELTASONE) 5 MG tablet Take 1 tablet by mouth daily. 11/18/14  Yes Historical Provider, MD  ranitidine (ZANTAC) 75 MG tablet Take 75 mg by mouth daily as needed for heartburn (reflux).    Yes Historical Provider, MD  RYTARY [phone] MG CPCR TAKE 1 CAPSULE DAILY AFTER SUPPER. 06/01/15  Yes Brian Ducking, MD  selegiline (ELDEPRYL) 5 MG capsule TAKE 1 CAPSULE TWICE DAILY WITH BREAKFAST AND LUNCH. 09/30/15  Yes Brian Ducking, MD  triamcinolone (NASACORT) 55 MCG/ACT AERO  nasal inhaler Place 2 sprays into the nose as needed (allergies).   Yes Historical Provider, MD  vitamin B-12 (CYANOCOBALAMIN) 500 MCG tablet Take 500 mcg by mouth every morning.    Yes Historical Provider, MD  vitamin D, CHOLECALCIFEROL, 400 UNITS tablet Take 400 Units by mouth every morning.   Yes Historical Provider, MD    ROS:  Out of a complete 14 system review of symptoms, the patient complains only of the following symptoms, and all other reviewed systems are negative.  Difficulty swallowing, drooling Double vision Cough Constipation Daytime sleepiness Urgency of the bladder Skin wounds Bruising easily Dizziness, speech difficulty, weakness Decreased concentration, anxiety  Blood pressure 124/68, pulse 64, height 5\' 11"  (1.803 m), weight 154 lb (69.9 kg).  Physical Exam  General: The patient is alert and cooperative at the time of the examination.  Skin: No significant peripheral edema is noted.   Neurologic Exam  Mental status: The patient is alert and oriented x 3 at the time of the examination. The patient has apparent normal recent and remote memory, with an apparently normal attention span and concentration ability.   Cranial nerves: Facial symmetry is present. Speech is normal, no aphasia or dysarthria is noted. Extraocular movements are full. Visual fields are full. Masking of the face is seen.  Motor: The patient has good strength in all 4 extremities. The patient will lean to the right.  Sensory examination: Soft touch sensation is symmetric on the face, arms, and legs.  Coordination: The patient has good finger-nose-finger and heel-to-shin bilaterally.  Gait and station: The patient has difficulty arising from a seated position, he requires some assistance for this. Once up, he can walk with a walker, he has relatively good stride, some hesitation with turns. Tandem gait was not attempted. Romberg is negative. No drift is seen.  Reflexes: Deep tendon  reflexes are symmetric.   Assessment/Plan:  1. Parkinson's disease  2. Gait disorder  3. Orthostatic hypotension  The patient was given a prescription for a lift chair. He will continue to remain active. The patient reports some sensation of weakness in the left leg. He is working with a Physiological scientist. He will follow-up in about 4 months. Overall, he is maintaining his functional level relatively well.  Jill Alexanders MD 11/30/2015 11:05 AM  [facility] [address] [place], [place] [address]  Phone [phone] Fax [phone]

## 2015-12-01 ENCOUNTER — Other Ambulatory Visit: Payer: Self-pay | Admitting: Neurology

## 2015-12-01 NOTE — Telephone Encounter (Signed)
I see from you last note only the 25-100 mg tabs. This strength not on med list.  Refill is for 25-250mg  tabs?   From 08-12-15 note states pt on both.  Renew?

## 2015-12-09 DIAGNOSIS — Z85828 Personal history of other malignant neoplasm of skin: Secondary | ICD-10-CM | POA: Diagnosis not present

## 2015-12-09 DIAGNOSIS — L0889 Other specified local infections of the skin and subcutaneous tissue: Secondary | ICD-10-CM | POA: Diagnosis not present

## 2015-12-09 DIAGNOSIS — D485 Neoplasm of uncertain behavior of skin: Secondary | ICD-10-CM | POA: Diagnosis not present

## 2015-12-09 DIAGNOSIS — L0100 Impetigo, unspecified: Secondary | ICD-10-CM | POA: Diagnosis not present

## 2016-02-03 DIAGNOSIS — Z85828 Personal history of other malignant neoplasm of skin: Secondary | ICD-10-CM | POA: Diagnosis not present

## 2016-02-03 DIAGNOSIS — D485 Neoplasm of uncertain behavior of skin: Secondary | ICD-10-CM | POA: Diagnosis not present

## 2016-03-06 ENCOUNTER — Other Ambulatory Visit: Payer: Self-pay | Admitting: Neurology

## 2016-03-09 DIAGNOSIS — S7002XA Contusion of left hip, initial encounter: Secondary | ICD-10-CM | POA: Diagnosis not present

## 2016-03-09 DIAGNOSIS — S20212A Contusion of left front wall of thorax, initial encounter: Secondary | ICD-10-CM | POA: Diagnosis not present

## 2016-03-11 IMAGING — CT CT HEAD W/O CM
2 series · 16 of 30 positions shown, 20 images · non-contrast
Comparison: 03/09/2014

CLINICAL DATA: Unresponsive episode

EXAM:
CT HEAD WITHOUT CONTRAST
TECHNIQUE: Contiguous axial images were obtained from the base of the skull
through the vertex without intravenous contrast.

[Series 2: head w/o · axial · non-contrast · 0.45mm/px · z∈[-117,+13]mm · 13 of 32 slices shown, 17 images]
[im 3/32  brain]
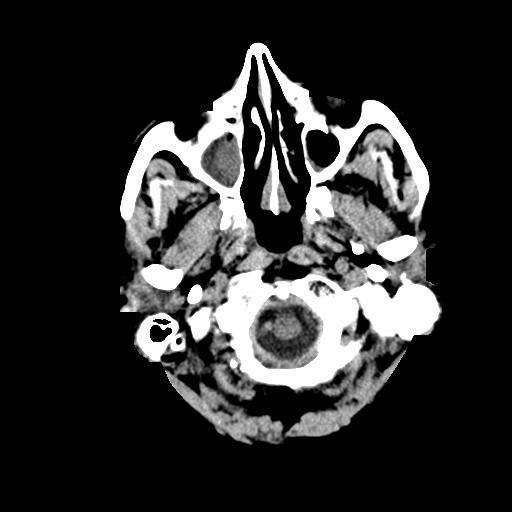
[im 3/32  bone]
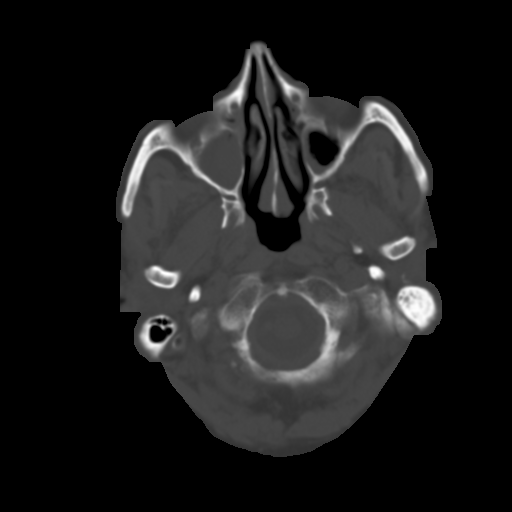
[im 5/32  brain]
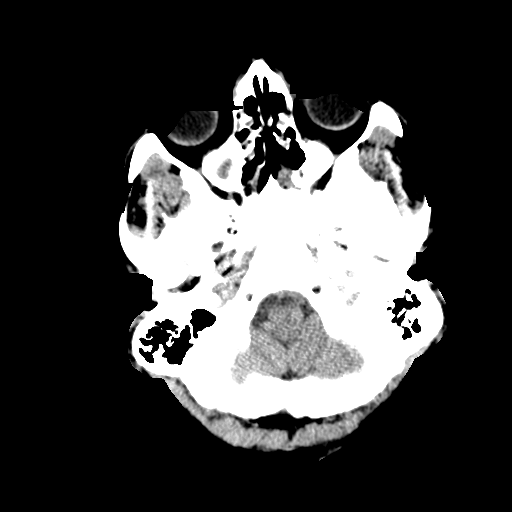
[im 7/32  brain]
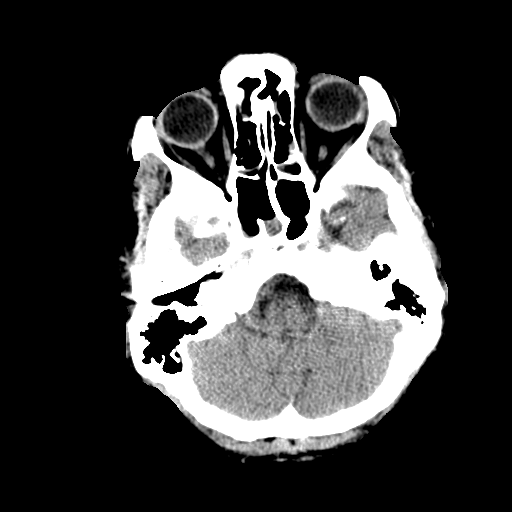
[im 9/32  brain]
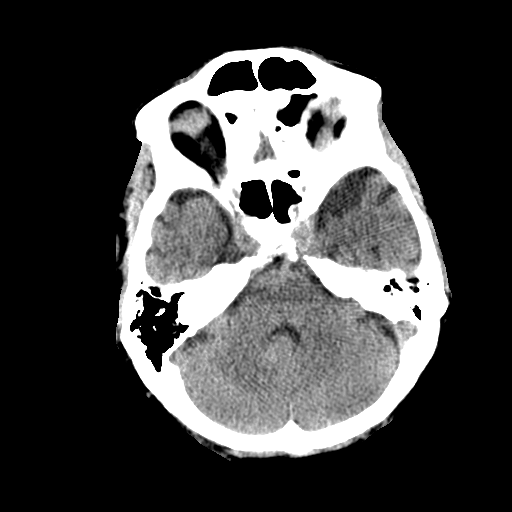
[im 12/32  brain]
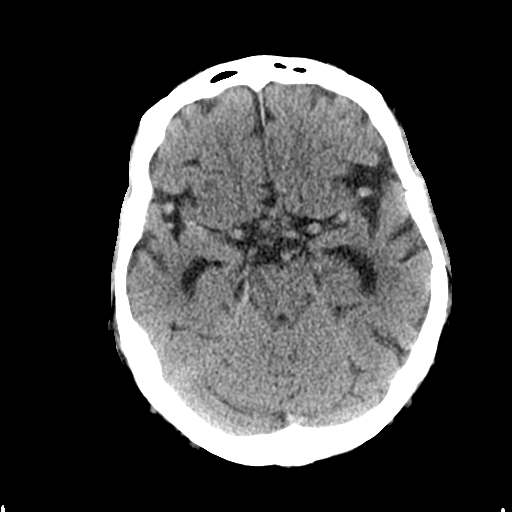
[im 12/32  bone]
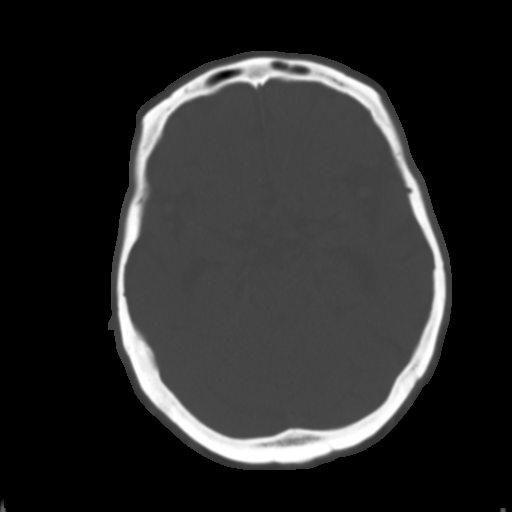
[im 14/32  brain]
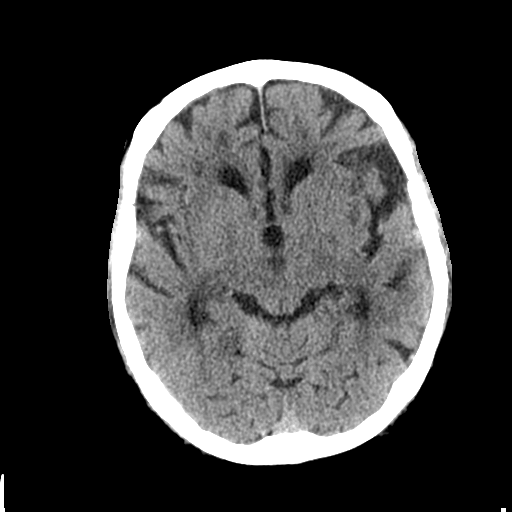
[im 16/32  brain]
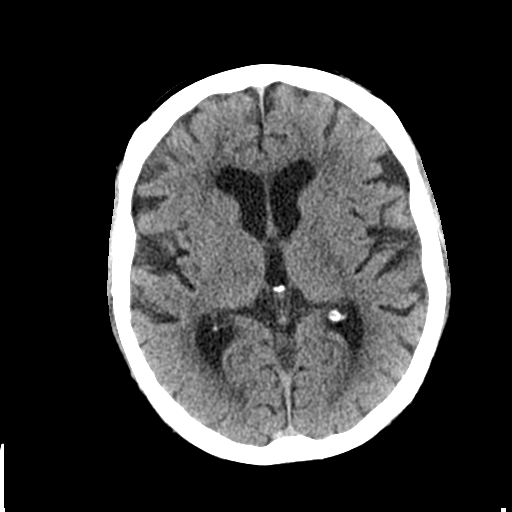
[im 18/32  brain]
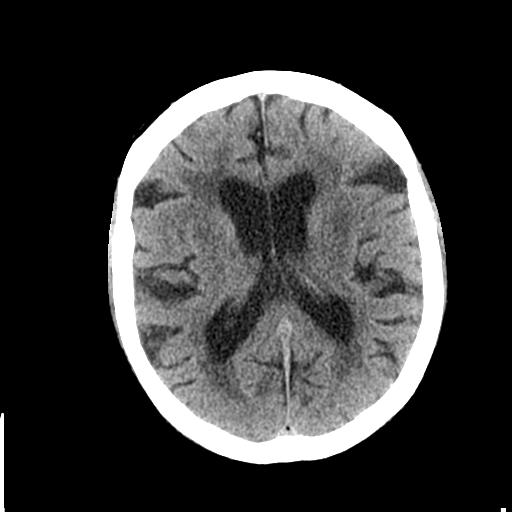
[im 20/32  brain]
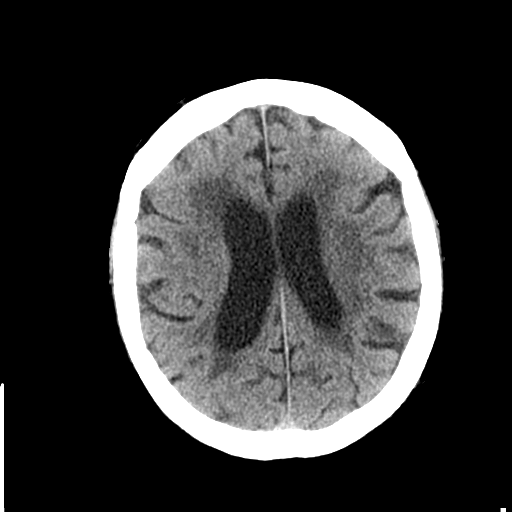
[im 20/32  bone]
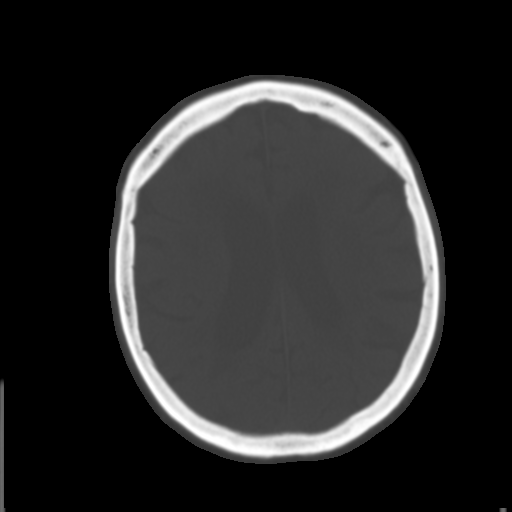
[im 23/32  brain]
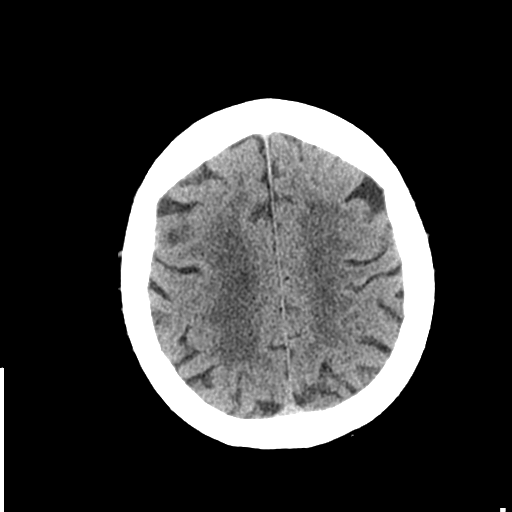
[im 25/32  brain]
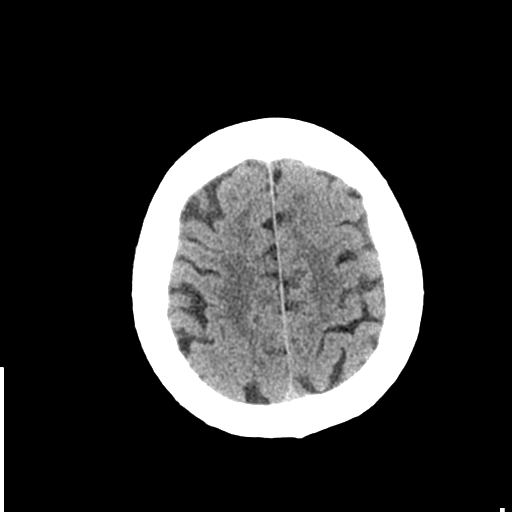
[im 27/32  brain]
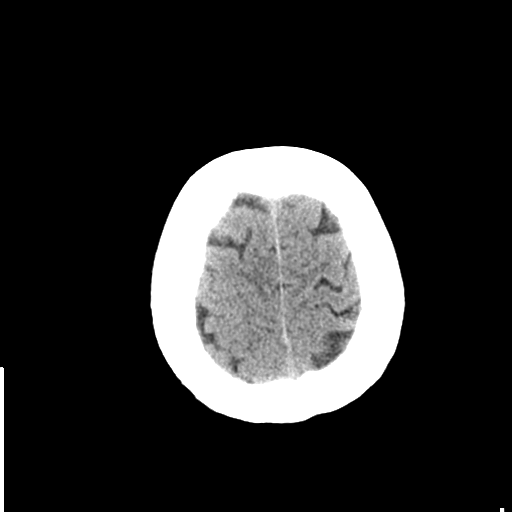
[im 29/32  brain]
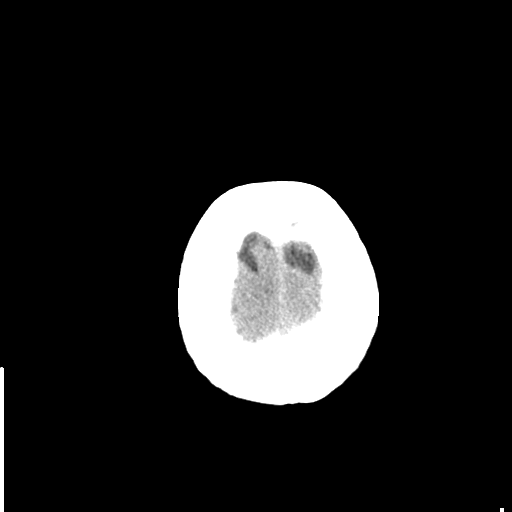
[im 29/32  bone]
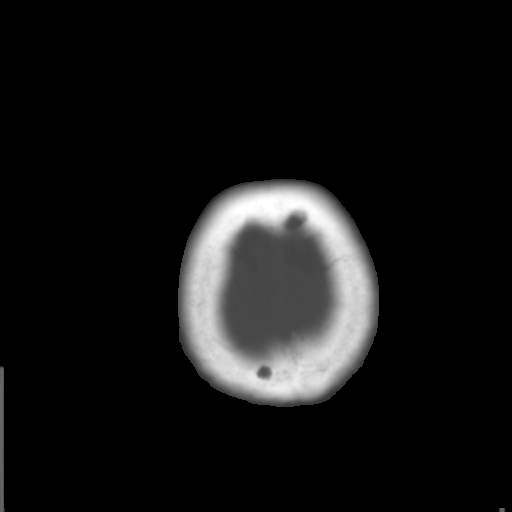

[Series 3: bone windows · axial · 0.45mm/px · z∈[-117,-72]mm · 3 of 32 slices shown]
[im 3/32  bone]
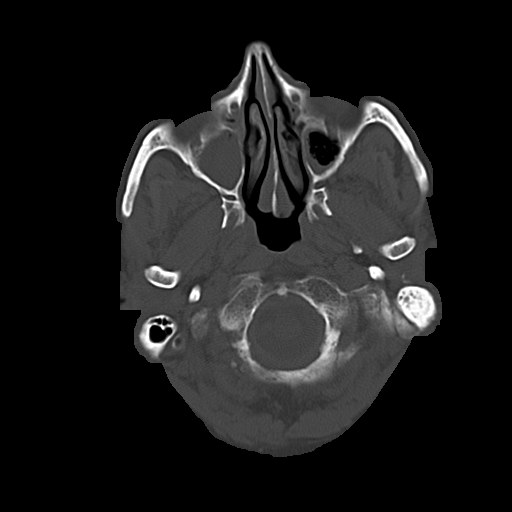
[im 7/32  bone]
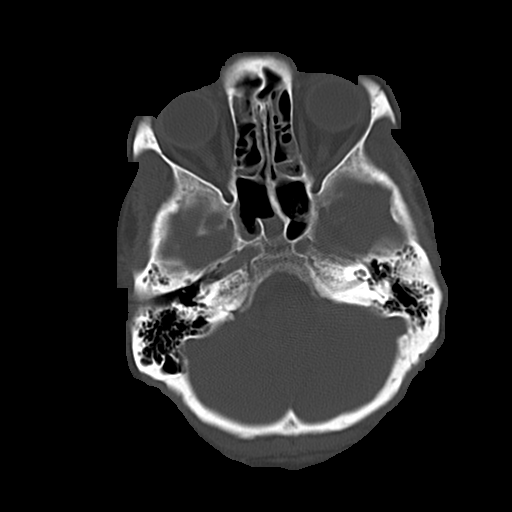
[im 12/32  bone]
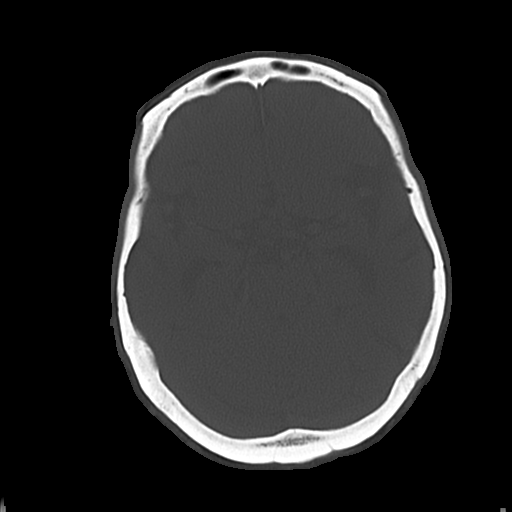

[16 of 30 positions shown; findings below may reference images not displayed]

FINDINGS: Global atrophy. Chronic ischemic changes in the periventricular
white matter and external capsules. No mass effect, midline shift,
or acute hemorrhage. There is partial opacification of both
maxillary sinuses and the sphenoid sinus. Fluid levels are present
with mucous material. Mastoid air cells are clear. Cranium is
intact.
IMPRESSION: No acute intracranial pathology. Chronic ischemic changes and
atrophy are noted.

## 2016-03-13 DIAGNOSIS — R413 Other amnesia: Secondary | ICD-10-CM | POA: Diagnosis not present

## 2016-03-13 DIAGNOSIS — G20 Parkinson's disease: Secondary | ICD-10-CM | POA: Diagnosis not present

## 2016-03-16 ENCOUNTER — Telehealth: Payer: Self-pay | Admitting: Neurology

## 2016-03-16 NOTE — Telephone Encounter (Signed)
We will get the patient worked in sooner, we need to make sure that he is not orthostatic as a source of his falls.

## 2016-03-16 NOTE — Telephone Encounter (Signed)
Called and spoke with wife, Constance Holster. Scheduled earlier f/u for 03/23/16 at 12pm, check in 1145am. She verbalized understanding and read date/time back correctly.

## 2016-03-16 NOTE — Telephone Encounter (Signed)
Patient called office in reference to left leg weakness going on for about 2 weeks.  Weakness has caused patient to have a fall 10 days ago.  No other symptoms.  Patient would like to be seen sooner than 04/30/16 with [name]

## 2016-03-16 NOTE — Telephone Encounter (Signed)
Dr Jannifer Franklin- please advise. You do have a 730am work in [place] available on 03/22/16 if you want to work him in then

## 2016-03-21 ENCOUNTER — Telehealth: Payer: Self-pay | Admitting: Neurology

## 2016-03-21 DIAGNOSIS — G20 Parkinson's disease: Secondary | ICD-10-CM | POA: Diagnosis not present

## 2016-03-21 DIAGNOSIS — R413 Other amnesia: Secondary | ICD-10-CM | POA: Diagnosis not present

## 2016-03-21 NOTE — Telephone Encounter (Signed)
Beth patient's daughter is calling. The patient has appointment with [name] on 03-23-16. She will not be at this appointment but wants [name] to know he takes carbidopa-levodopa (SINEMET IR) 25-250 MG tablet and Mirapex at 6am which is too early. Can time of dosage be changed. A returned call is not needed unless questions but please advise patient at his appointment.

## 2016-03-21 NOTE — Telephone Encounter (Signed)
Noted, I will discuss with the patient on the next revisit, 6 AM is not to early depending upon the schedule of the patient. I will try to find out what the issues are with taking the medication at 6 AM.

## 2016-03-22 DIAGNOSIS — R413 Other amnesia: Secondary | ICD-10-CM | POA: Diagnosis not present

## 2016-03-22 DIAGNOSIS — G20 Parkinson's disease: Secondary | ICD-10-CM | POA: Diagnosis not present

## 2016-03-23 ENCOUNTER — Encounter: Payer: Self-pay | Admitting: Neurology

## 2016-03-23 ENCOUNTER — Ambulatory Visit (INDEPENDENT_AMBULATORY_CARE_PROVIDER_SITE_OTHER): Payer: Medicare Other | Admitting: Neurology

## 2016-03-23 VITALS — BP 162/76 | HR 59 | Ht 71.0 in | Wt 152.5 lb

## 2016-03-23 DIAGNOSIS — I951 Orthostatic hypotension: Secondary | ICD-10-CM

## 2016-03-23 DIAGNOSIS — G20 Parkinson's disease: Secondary | ICD-10-CM

## 2016-03-23 DIAGNOSIS — R413 Other amnesia: Secondary | ICD-10-CM | POA: Diagnosis not present

## 2016-03-23 DIAGNOSIS — R269 Unspecified abnormalities of gait and mobility: Secondary | ICD-10-CM | POA: Diagnosis not present

## 2016-03-23 NOTE — Patient Instructions (Signed)
   We will change the schedule of the Sinemet 25/250 mg to 7:30 am, 12:30 PM, and 4:30 PM  Sinemet 25/100 mg take one tablet at 10 AM, 3 PM and at 7 PM  Rytary is taken at bedtime.

## 2016-03-23 NOTE — Progress Notes (Signed)
Reason for visit: Parkinson's disease  Brian Aguirre is an 83 y.o. male  History of present illness:  Brian Aguirre is an 83 year old right-handed white male with a history of Parkinson's disease associated with a significant gait disorder. The patient has a tendency to lean backwards, he uses a walker for ambulation. He has also had some issues with orthostatic hypotension that typically occurs shortly after eating breakfast in the morning. Giving him Gatorade in the morning has helped. Three weeks ago, he had a fall that was associated with a freezing event of the left leg. The patient feels somewhat weak since the fall, he has not had any further events, however. He was seen by Dr. Lynann Bologna, and x-rays were done and were unremarkable. The patient has been set up for physical and speech therapy. This will be starting soon. The patient has some variation in his dosing of the Sinemet, sometimes he will have a delay in the first dose of Sinemet during the day, and he will skip the second dose. The patient has had some occasional cough, no significant issues with eating. The patient at times may feel dizzy when he stands up. He returns to this office for an evaluation.  Past Medical History:  Diagnosis Date  . Benign enlargement of prostate   . Chronic constipation 02/26/2014  . Contusion of forehead 03/09/2014  . Depression   . Dizziness   . Fracture, rib   . Gait disturbance   . History of fall 03/09/2014  . Hypertension   . Memory difficulties 07/20/2014  . Memory loss   . Orthostatic hypotension   . Osteoporosis   . Parkinson disease [facility])     Past Surgical History:  Procedure Laterality Date  . APPENDECTOMY    . CATARACT EXTRACTION Left   . HERNIA REPAIR    . TONSILLECTOMY      Family History  Problem Relation Age of Onset  . Cancer Mother   . Heart failure Father   . Breast cancer Daughter     Social history:  reports that he has never smoked. He has never used smokeless  tobacco. He reports that he does not drink alcohol or use drugs.    Allergies  Allergen Reactions  . Myrbetriq [Mirabegron] Nausea And Vomiting  . Stalevo [Carbidopa-Levodopa-Entacapone] Diarrhea    GI upset    Medications:  Prior to Admission medications   Medication Sig Start Date End Date Taking? Authorizing Provider  AMITIZA 24 MCG capsule TAKE 1 CAPSULE TWICE DAILY WITH MEALS. 06/01/15  Yes Kathrynn Ducking, MD  aspirin EC 81 MG tablet Take 81 mg by mouth at bedtime.   Yes Historical Provider, MD  carbidopa-levodopa (SINEMET IR) 25-100 MG tablet TAKE 1 TABLET 3 TIMES A DAY. Patient taking differently: TAKE 1 TABLET in the morning and 1/2 tablet at midday and evening 06/01/15  Yes Kathrynn Ducking, MD  carbidopa-levodopa (SINEMET IR) 25-250 MG tablet TAKE  (1)  TABLET  FOUR TIMES DAILY. 12/01/15  Yes Kathrynn Ducking, MD  docusate sodium (COLACE) 100 MG capsule Take 200 mg by mouth at bedtime.    Yes Historical Provider, MD  fish oil-omega-3 fatty acids 1000 MG capsule Take 1 g by mouth daily with breakfast.    Yes Historical Provider, MD  Multiple Vitamin (MULTIVITAMIN WITH MINERALS) TABS Take 1 tablet by mouth every morning.    Yes Historical Provider, MD  pramipexole (MIRAPEX) 1 MG tablet TAKE 1 TABLET 3 TIMES A DAY. 06/01/15  Yes Kathrynn Ducking, MD  Probiotic Product (ALIGN PO) Take 1 capsule by mouth daily.   Yes Historical Provider, MD  ranitidine (ZANTAC) 75 MG tablet Take 75 mg by mouth daily as needed for heartburn (reflux).    Yes Historical Provider, MD  RYTARY [phone] MG CPCR TAKE 1 CAPSULE DAILY AFTER SUPPER. 06/01/15  Yes Kathrynn Ducking, MD  selegiline (ELDEPRYL) 5 MG capsule TAKE 1 CAPSULE TWICE DAILY WITH BREAKFAST AND LUNCH. 09/30/15  Yes Kathrynn Ducking, MD  triamcinolone (NASACORT) 55 MCG/ACT AERO nasal inhaler Place 2 sprays into the nose as needed (allergies).   Yes Historical Provider, MD  vitamin B-12 (CYANOCOBALAMIN) 500 MCG tablet Take 500 mcg by mouth every  morning.    Yes Historical Provider, MD  vitamin D, CHOLECALCIFEROL, 400 UNITS tablet Take 400 Units by mouth every morning.   Yes Historical Provider, MD    ROS:  Out of a complete 14 system review of symptoms, the patient complains only of the following symptoms, and all other reviewed systems are negative.  Difficulty swallowing, drooling Eye itching, double vision Chest pain Constipation Frequency of urination Back pain, achy muscles Memory loss, dizziness, speech difficulty, weakness, tremors Confusion  Blood pressure (!) 162/76, pulse (!) 59, height 5\' 11"  (1.803 m), weight 152 lb 8 oz (69.2 kg).  Physical Exam  General: The patient is alert and cooperative at the time of the examination.  Respiratory: Occasional posterior wheezes are noted.  Cardiovascular: Regular rate and rhythm, no murmurs or rubs are noted.  Skin: No significant peripheral edema is noted.   Neurologic Exam  Mental status: The patient is alert and oriented x 3 at the time of the examination. The patient has apparent normal recent and remote memory, with an apparently normal attention span and concentration ability.   Cranial nerves: Facial symmetry is present. Speech is slightly dysphonic, not aphasic. Extraocular movements are full. Visual fields are full. Masking of the face is seen.  Motor: The patient has good strength in all 4 extremities.  Sensory examination: Soft touch sensation is symmetric on the face, arms, and legs.  Coordination: The patient has good finger-nose-finger and heel-to-shin bilaterally. Dyskinesias of the head and neck and body are noted.  Gait and station: The patient requires some assistance with standing, he has a tendency initially to lean backwards. He is able to ambulate with a walker fairly well, he will shuffle some with turns. Romberg is negative. No drift is seen.  Reflexes: Deep tendon reflexes are symmetric.   Assessment/Plan:  1. Parkinson's  disease  2. Gait disorder  The patient has had a recent fall, I see no evidence of true weakness of the left leg. The patient may have had a freezing event that occurred when he tried to make a turn. The patient is getting physical therapy currently, his medication regimen will be altered slightly taking the Sinemet 25/250 mg tablet at 7:30 AM, 12:30 PM, and at 5 PM. The Sinemet 25/100 tablet will be taken 1 at 9:30, 3 PM, and 7 PM. He gets one Rytary tablet in the evening before bedtime. He will follow-up in 4 months.  Jill Alexanders MD 03/23/2016 1:22 PM  [facility] [address] [place], [place] [address]  Phone [phone] Fax [phone]

## 2016-03-24 ENCOUNTER — Telehealth: Payer: Self-pay | Admitting: Neurology

## 2016-03-24 DIAGNOSIS — R413 Other amnesia: Secondary | ICD-10-CM | POA: Diagnosis not present

## 2016-03-24 DIAGNOSIS — G20 Parkinson's disease: Secondary | ICD-10-CM | POA: Diagnosis not present

## 2016-03-24 NOTE — Telephone Encounter (Signed)
Rn call patients daughter Brian Aguirre on dpr form about pts medication list and copy of dpr form. Brian Aguirre stated sometimes her father misplace the forms. She also stated the aide who helps her dad was given a copy.  Brian Aguirre ask could the avs and medication list be mail to her. Rn explain she can log onto mychart or we can fax it to her. Brian Aguirre did not have a fax available at the time. Rn gave pts daughter the directions of both dosage and times of sinemet of 25/250, and 25/100. Pts daughter verbalized understanding and read the directions and times back correctly as written in [name] note from yesterday.Rn advised Brian Aguirre that she can call back on Monday if she needs avs fax with medication list. Brian Aguirre verbalized understanding.

## 2016-03-24 NOTE — Telephone Encounter (Signed)
Patient's daughter who is on emergency contact list requesting to get a copy of the patient's medication and when he is to take it. He was seen yesterday by [name]. I tried to get a fax # but she said she wants to speak to the nurse.

## 2016-03-27 DIAGNOSIS — R413 Other amnesia: Secondary | ICD-10-CM | POA: Diagnosis not present

## 2016-03-27 DIAGNOSIS — G20 Parkinson's disease: Secondary | ICD-10-CM | POA: Diagnosis not present

## 2016-03-27 NOTE — Telephone Encounter (Signed)
Called and spoke with daughter, Lucita Ferrara. Relayed dosage instructions per  CW,MD last office note. Daughter would like some clarification because prior to this, pt was taking 1/2 tablet midday and evening. She is confused as to why he is going back to a whole tablet.  She would like to speak with CW,MD about this change. Advised I will send message to CW,MD.

## 2016-03-27 NOTE — Telephone Encounter (Signed)
Patients daughter Lucita Ferrara called to get clarification on patients sinemet 25/100.  Lucita Ferrara would like to know at the 3:00pm and the 7:00pm times is she to give patient a whole tablet or half.  Please call

## 2016-03-27 NOTE — Telephone Encounter (Signed)
I called the daughter. When the patient was seen, the [facility] sheet indicated that the patient was getting the 25/100 mg tablet, one full tablet 3 times daily. The patient has actually been taking 1 in the morning, half in the afternoon, one half in the evening. They are to continue this dose of the medication. If the dyskinesias and afternoon continue, the Sinemet will need to be further reduced.

## 2016-03-29 DIAGNOSIS — R413 Other amnesia: Secondary | ICD-10-CM | POA: Diagnosis not present

## 2016-03-29 DIAGNOSIS — G20 Parkinson's disease: Secondary | ICD-10-CM | POA: Diagnosis not present

## 2016-03-30 ENCOUNTER — Ambulatory Visit: Payer: Medicare Other | Admitting: Neurology

## 2016-03-31 DIAGNOSIS — G20 Parkinson's disease: Secondary | ICD-10-CM | POA: Diagnosis not present

## 2016-03-31 DIAGNOSIS — R413 Other amnesia: Secondary | ICD-10-CM | POA: Diagnosis not present

## 2016-04-03 DIAGNOSIS — G20 Parkinson's disease: Secondary | ICD-10-CM | POA: Diagnosis not present

## 2016-04-03 DIAGNOSIS — R413 Other amnesia: Secondary | ICD-10-CM | POA: Diagnosis not present

## 2016-04-05 ENCOUNTER — Encounter: Payer: Self-pay | Admitting: *Deleted

## 2016-04-05 DIAGNOSIS — G20 Parkinson's disease: Secondary | ICD-10-CM | POA: Diagnosis not present

## 2016-04-05 DIAGNOSIS — R413 Other amnesia: Secondary | ICD-10-CM | POA: Diagnosis not present

## 2016-04-05 NOTE — Progress Notes (Signed)
Faxed printed/signed orders to [facility] dated 03/14/16. Fax: [phone]. Received confirmation.

## 2016-04-07 DIAGNOSIS — R413 Other amnesia: Secondary | ICD-10-CM | POA: Diagnosis not present

## 2016-04-07 DIAGNOSIS — G20 Parkinson's disease: Secondary | ICD-10-CM | POA: Diagnosis not present

## 2016-04-10 DIAGNOSIS — G20 Parkinson's disease: Secondary | ICD-10-CM | POA: Diagnosis not present

## 2016-04-10 DIAGNOSIS — R413 Other amnesia: Secondary | ICD-10-CM | POA: Diagnosis not present

## 2016-04-11 DIAGNOSIS — R413 Other amnesia: Secondary | ICD-10-CM | POA: Diagnosis not present

## 2016-04-11 DIAGNOSIS — I10 Essential (primary) hypertension: Secondary | ICD-10-CM | POA: Diagnosis not present

## 2016-04-11 DIAGNOSIS — G20 Parkinson's disease: Secondary | ICD-10-CM | POA: Diagnosis not present

## 2016-04-12 DIAGNOSIS — G20 Parkinson's disease: Secondary | ICD-10-CM | POA: Diagnosis not present

## 2016-04-12 DIAGNOSIS — R413 Other amnesia: Secondary | ICD-10-CM | POA: Diagnosis not present

## 2016-04-13 ENCOUNTER — Encounter: Payer: Self-pay | Admitting: *Deleted

## 2016-04-13 DIAGNOSIS — R413 Other amnesia: Secondary | ICD-10-CM | POA: Diagnosis not present

## 2016-04-13 DIAGNOSIS — G20 Parkinson's disease: Secondary | ICD-10-CM | POA: Diagnosis not present

## 2016-04-13 NOTE — Progress Notes (Signed)
Faxed signed orders back to [facility] for ST 2 times per week for 3 weeks. Effective week of 04/10/16 targeting speech intelligibility. Fax: [phone]. Received confirmation.   Also mailed order back to [facility] in envelope that was included with order.

## 2016-04-18 DIAGNOSIS — R413 Other amnesia: Secondary | ICD-10-CM | POA: Diagnosis not present

## 2016-04-18 DIAGNOSIS — G20 Parkinson's disease: Secondary | ICD-10-CM | POA: Diagnosis not present

## 2016-04-19 DIAGNOSIS — R413 Other amnesia: Secondary | ICD-10-CM | POA: Diagnosis not present

## 2016-04-19 DIAGNOSIS — G20 Parkinson's disease: Secondary | ICD-10-CM | POA: Diagnosis not present

## 2016-04-20 DIAGNOSIS — R413 Other amnesia: Secondary | ICD-10-CM | POA: Diagnosis not present

## 2016-04-20 DIAGNOSIS — G20 Parkinson's disease: Secondary | ICD-10-CM | POA: Diagnosis not present

## 2016-04-21 DIAGNOSIS — R413 Other amnesia: Secondary | ICD-10-CM | POA: Diagnosis not present

## 2016-04-21 DIAGNOSIS — G20 Parkinson's disease: Secondary | ICD-10-CM | POA: Diagnosis not present

## 2016-04-25 DIAGNOSIS — G20 Parkinson's disease: Secondary | ICD-10-CM | POA: Diagnosis not present

## 2016-04-25 DIAGNOSIS — R413 Other amnesia: Secondary | ICD-10-CM | POA: Diagnosis not present

## 2016-04-26 DIAGNOSIS — R413 Other amnesia: Secondary | ICD-10-CM | POA: Diagnosis not present

## 2016-04-26 DIAGNOSIS — G20 Parkinson's disease: Secondary | ICD-10-CM | POA: Diagnosis not present

## 2016-04-27 DIAGNOSIS — R413 Other amnesia: Secondary | ICD-10-CM | POA: Diagnosis not present

## 2016-04-27 DIAGNOSIS — G20 Parkinson's disease: Secondary | ICD-10-CM | POA: Diagnosis not present

## 2016-04-28 DIAGNOSIS — R413 Other amnesia: Secondary | ICD-10-CM | POA: Diagnosis not present

## 2016-04-28 DIAGNOSIS — G20 Parkinson's disease: Secondary | ICD-10-CM | POA: Diagnosis not present

## 2016-05-04 DIAGNOSIS — G20 Parkinson's disease: Secondary | ICD-10-CM | POA: Diagnosis not present

## 2016-05-04 DIAGNOSIS — R413 Other amnesia: Secondary | ICD-10-CM | POA: Diagnosis not present

## 2016-05-05 DIAGNOSIS — G20 Parkinson's disease: Secondary | ICD-10-CM | POA: Diagnosis not present

## 2016-05-05 DIAGNOSIS — R413 Other amnesia: Secondary | ICD-10-CM | POA: Diagnosis not present

## 2016-05-08 ENCOUNTER — Telehealth: Payer: Self-pay | Admitting: Neurology

## 2016-05-08 DIAGNOSIS — R413 Other amnesia: Secondary | ICD-10-CM | POA: Diagnosis not present

## 2016-05-08 DIAGNOSIS — G20 Parkinson's disease: Secondary | ICD-10-CM | POA: Diagnosis not present

## 2016-05-08 MED ORDER — CLONIDINE HCL 0.1 MG PO TABS: 0.1000 mg | ORAL_TABLET | Freq: Every day | ORAL | 1 refills | Status: DC | PRN

## 2016-05-08 NOTE — Telephone Encounter (Signed)
Dr Jannifer Franklin- do you want me to let her know to contact PCP office regarding BP?

## 2016-05-08 NOTE — Telephone Encounter (Signed)
I called the daughter. The patient has had several days with the blood pressure may be sustained over 180, may get up as high as 195 systolic. I will give up her prescription for low-dose clonidine to take if needed for a sustained blood pressure of greater than 974 systolic for greater than one hour.  His blood pressures are quite labile.

## 2016-05-08 NOTE — Telephone Encounter (Signed)
Patients daughter Lucita Ferrara (listed on DPR) called office in reference to patient over the past week patient has had spells of high BP.  Readings are coming back high in the morning will eat breakfast bringing it down, but by mid afternoon readings will be high again.  Daughter is requesting if her father is able to have a prescription for BP medication and instructions how to take it.  Pharmacy-[place].  Please call- Daughter will be faxing over readings this morning.

## 2016-05-08 NOTE — Addendum Note (Signed)
Addended by: Kathrynn Ducking on: 05/08/2016 02:22 PM   Modules accepted: Orders

## 2016-05-09 DIAGNOSIS — G20 Parkinson's disease: Secondary | ICD-10-CM | POA: Diagnosis not present

## 2016-05-09 DIAGNOSIS — R413 Other amnesia: Secondary | ICD-10-CM | POA: Diagnosis not present

## 2016-05-10 DIAGNOSIS — R413 Other amnesia: Secondary | ICD-10-CM | POA: Diagnosis not present

## 2016-05-10 DIAGNOSIS — G20 Parkinson's disease: Secondary | ICD-10-CM | POA: Diagnosis not present

## 2016-05-11 ENCOUNTER — Telehealth: Payer: Self-pay | Admitting: Neurology

## 2016-05-11 DIAGNOSIS — R413 Other amnesia: Secondary | ICD-10-CM | POA: Diagnosis not present

## 2016-05-11 DIAGNOSIS — G20 Parkinson's disease: Secondary | ICD-10-CM | POA: Diagnosis not present

## 2016-05-11 NOTE — Telephone Encounter (Signed)
Dr [name]- please advise 

## 2016-05-11 NOTE — Telephone Encounter (Signed)
I called back, left a message. Okay for discharge from skilled nursing.

## 2016-05-11 NOTE — Telephone Encounter (Signed)
No[facility] [phone] called, is it ok to discharge from skilled nursing. He does have other RN's that sit with him.

## 2016-05-12 DIAGNOSIS — R413 Other amnesia: Secondary | ICD-10-CM | POA: Diagnosis not present

## 2016-05-12 DIAGNOSIS — G20 Parkinson's disease: Secondary | ICD-10-CM | POA: Diagnosis not present

## 2016-05-15 DIAGNOSIS — R413 Other amnesia: Secondary | ICD-10-CM | POA: Diagnosis not present

## 2016-05-15 DIAGNOSIS — G20 Parkinson's disease: Secondary | ICD-10-CM | POA: Diagnosis not present

## 2016-05-16 ENCOUNTER — Other Ambulatory Visit: Payer: Self-pay | Admitting: Neurology

## 2016-05-16 DIAGNOSIS — R413 Other amnesia: Secondary | ICD-10-CM | POA: Diagnosis not present

## 2016-05-16 DIAGNOSIS — G20 Parkinson's disease: Secondary | ICD-10-CM | POA: Diagnosis not present

## 2016-05-18 DIAGNOSIS — R413 Other amnesia: Secondary | ICD-10-CM | POA: Diagnosis not present

## 2016-05-18 DIAGNOSIS — G20 Parkinson's disease: Secondary | ICD-10-CM | POA: Diagnosis not present

## 2016-05-22 DIAGNOSIS — G20 Parkinson's disease: Secondary | ICD-10-CM | POA: Diagnosis not present

## 2016-05-22 DIAGNOSIS — R413 Other amnesia: Secondary | ICD-10-CM | POA: Diagnosis not present

## 2016-05-23 ENCOUNTER — Other Ambulatory Visit: Payer: Self-pay | Admitting: Neurology

## 2016-05-23 DIAGNOSIS — R413 Other amnesia: Secondary | ICD-10-CM | POA: Diagnosis not present

## 2016-05-23 DIAGNOSIS — G20 Parkinson's disease: Secondary | ICD-10-CM | POA: Diagnosis not present

## 2016-05-24 DIAGNOSIS — G20 Parkinson's disease: Secondary | ICD-10-CM | POA: Diagnosis not present

## 2016-05-24 DIAGNOSIS — R413 Other amnesia: Secondary | ICD-10-CM | POA: Diagnosis not present

## 2016-05-25 DIAGNOSIS — R413 Other amnesia: Secondary | ICD-10-CM | POA: Diagnosis not present

## 2016-05-25 DIAGNOSIS — G20 Parkinson's disease: Secondary | ICD-10-CM | POA: Diagnosis not present

## 2016-05-29 DIAGNOSIS — G20 Parkinson's disease: Secondary | ICD-10-CM | POA: Diagnosis not present

## 2016-05-29 DIAGNOSIS — R413 Other amnesia: Secondary | ICD-10-CM | POA: Diagnosis not present

## 2016-05-30 DIAGNOSIS — G20 Parkinson's disease: Secondary | ICD-10-CM | POA: Diagnosis not present

## 2016-05-30 DIAGNOSIS — R413 Other amnesia: Secondary | ICD-10-CM | POA: Diagnosis not present

## 2016-05-31 DIAGNOSIS — R413 Other amnesia: Secondary | ICD-10-CM | POA: Diagnosis not present

## 2016-05-31 DIAGNOSIS — G20 Parkinson's disease: Secondary | ICD-10-CM | POA: Diagnosis not present

## 2016-06-01 DIAGNOSIS — R413 Other amnesia: Secondary | ICD-10-CM | POA: Diagnosis not present

## 2016-06-01 DIAGNOSIS — G20 Parkinson's disease: Secondary | ICD-10-CM | POA: Diagnosis not present

## 2016-06-02 ENCOUNTER — Other Ambulatory Visit: Payer: Self-pay | Admitting: Neurology

## 2016-06-07 DIAGNOSIS — G20 Parkinson's disease: Secondary | ICD-10-CM | POA: Diagnosis not present

## 2016-06-07 DIAGNOSIS — R413 Other amnesia: Secondary | ICD-10-CM | POA: Diagnosis not present

## 2016-06-13 DIAGNOSIS — G20 Parkinson's disease: Secondary | ICD-10-CM | POA: Diagnosis not present

## 2016-06-13 DIAGNOSIS — R413 Other amnesia: Secondary | ICD-10-CM | POA: Diagnosis not present

## 2016-06-21 ENCOUNTER — Other Ambulatory Visit: Payer: Self-pay | Admitting: Neurology

## 2016-07-04 ENCOUNTER — Observation Stay (HOSPITAL_COMMUNITY)
Admission: EM | Admit: 2016-07-04 | Discharge: 2016-07-05 | Disposition: A | Discharge status: Home / Self Care | Payer: Medicare Other | Attending: Nephrology | Admitting: Nephrology

## 2016-07-04 ENCOUNTER — Encounter (HOSPITAL_COMMUNITY): Payer: Self-pay | Admitting: *Deleted

## 2016-07-04 ENCOUNTER — Emergency Department (HOSPITAL_COMMUNITY): Payer: Medicare Other

## 2016-07-04 DIAGNOSIS — M546 Pain in thoracic spine: Secondary | ICD-10-CM | POA: Insufficient documentation

## 2016-07-04 DIAGNOSIS — R42 Dizziness and giddiness: Secondary | ICD-10-CM | POA: Diagnosis not present

## 2016-07-04 DIAGNOSIS — M810 Age-related osteoporosis without current pathological fracture: Secondary | ICD-10-CM | POA: Insufficient documentation

## 2016-07-04 DIAGNOSIS — K120 Recurrent oral aphthae: Secondary | ICD-10-CM | POA: Insufficient documentation

## 2016-07-04 DIAGNOSIS — Z809 Family history of malignant neoplasm, unspecified: Secondary | ICD-10-CM | POA: Diagnosis not present

## 2016-07-04 DIAGNOSIS — R609 Edema, unspecified: Secondary | ICD-10-CM | POA: Insufficient documentation

## 2016-07-04 DIAGNOSIS — D649 Anemia, unspecified: Secondary | ICD-10-CM | POA: Diagnosis not present

## 2016-07-04 DIAGNOSIS — W1839XA Other fall on same level, initial encounter: Secondary | ICD-10-CM | POA: Insufficient documentation

## 2016-07-04 DIAGNOSIS — R55 Syncope and collapse: Secondary | ICD-10-CM | POA: Diagnosis not present

## 2016-07-04 DIAGNOSIS — Y9301 Activity, walking, marching and hiking: Secondary | ICD-10-CM | POA: Diagnosis not present

## 2016-07-04 DIAGNOSIS — Z9842 Cataract extraction status, left eye: Secondary | ICD-10-CM | POA: Insufficient documentation

## 2016-07-04 DIAGNOSIS — N400 Enlarged prostate without lower urinary tract symptoms: Secondary | ICD-10-CM | POA: Diagnosis not present

## 2016-07-04 DIAGNOSIS — G909 Disorder of the autonomic nervous system, unspecified: Secondary | ICD-10-CM | POA: Insufficient documentation

## 2016-07-04 DIAGNOSIS — S0990XA Unspecified injury of head, initial encounter: Secondary | ICD-10-CM

## 2016-07-04 DIAGNOSIS — Z888 Allergy status to other drugs, medicaments and biological substances status: Secondary | ICD-10-CM | POA: Diagnosis not present

## 2016-07-04 DIAGNOSIS — G20 Parkinson's disease: Secondary | ICD-10-CM | POA: Diagnosis not present

## 2016-07-04 DIAGNOSIS — F329 Major depressive disorder, single episode, unspecified: Secondary | ICD-10-CM | POA: Diagnosis not present

## 2016-07-04 DIAGNOSIS — I951 Orthostatic hypotension: Secondary | ICD-10-CM | POA: Insufficient documentation

## 2016-07-04 DIAGNOSIS — I447 Left bundle-branch block, unspecified: Secondary | ICD-10-CM | POA: Insufficient documentation

## 2016-07-04 DIAGNOSIS — Z8249 Family history of ischemic heart disease and other diseases of the circulatory system: Secondary | ICD-10-CM | POA: Diagnosis not present

## 2016-07-04 DIAGNOSIS — Z803 Family history of malignant neoplasm of breast: Secondary | ICD-10-CM | POA: Diagnosis not present

## 2016-07-04 DIAGNOSIS — E871 Hypo-osmolality and hyponatremia: Secondary | ICD-10-CM | POA: Diagnosis not present

## 2016-07-04 DIAGNOSIS — F458 Other somatoform disorders: Secondary | ICD-10-CM | POA: Diagnosis not present

## 2016-07-04 DIAGNOSIS — Z7982 Long term (current) use of aspirin: Secondary | ICD-10-CM | POA: Diagnosis not present

## 2016-07-04 DIAGNOSIS — K5909 Other constipation: Secondary | ICD-10-CM | POA: Insufficient documentation

## 2016-07-04 DIAGNOSIS — R413 Other amnesia: Secondary | ICD-10-CM | POA: Diagnosis not present

## 2016-07-04 DIAGNOSIS — I10 Essential (primary) hypertension: Secondary | ICD-10-CM | POA: Diagnosis not present

## 2016-07-04 DIAGNOSIS — T148XXA Other injury of unspecified body region, initial encounter: Secondary | ICD-10-CM | POA: Diagnosis not present

## 2016-07-04 DIAGNOSIS — S199XXA Unspecified injury of neck, initial encounter: Secondary | ICD-10-CM | POA: Diagnosis not present

## 2016-07-04 DIAGNOSIS — Y92098 Other place in other non-institutional residence as the place of occurrence of the external cause: Secondary | ICD-10-CM | POA: Diagnosis not present

## 2016-07-04 DIAGNOSIS — R9431 Abnormal electrocardiogram [ECG] [EKG]: Secondary | ICD-10-CM | POA: Diagnosis not present

## 2016-07-04 DIAGNOSIS — W19XXXA Unspecified fall, initial encounter: Secondary | ICD-10-CM

## 2016-07-04 DIAGNOSIS — R3915 Urgency of urination: Secondary | ICD-10-CM | POA: Insufficient documentation

## 2016-07-04 DIAGNOSIS — R269 Unspecified abnormalities of gait and mobility: Secondary | ICD-10-CM | POA: Diagnosis not present

## 2016-07-04 DIAGNOSIS — M545 Low back pain: Secondary | ICD-10-CM | POA: Diagnosis not present

## 2016-07-04 DIAGNOSIS — I080 Rheumatic disorders of both mitral and aortic valves: Secondary | ICD-10-CM | POA: Diagnosis not present

## 2016-07-04 DIAGNOSIS — Z79899 Other long term (current) drug therapy: Secondary | ICD-10-CM | POA: Diagnosis not present

## 2016-07-04 DIAGNOSIS — K219 Gastro-esophageal reflux disease without esophagitis: Secondary | ICD-10-CM | POA: Insufficient documentation

## 2016-07-04 DIAGNOSIS — S50311A Abrasion of right elbow, initial encounter: Secondary | ICD-10-CM | POA: Diagnosis not present

## 2016-07-04 DIAGNOSIS — S0181XA Laceration without foreign body of other part of head, initial encounter: Secondary | ICD-10-CM | POA: Diagnosis not present

## 2016-07-04 DIAGNOSIS — S0083XA Contusion of other part of head, initial encounter: Secondary | ICD-10-CM | POA: Diagnosis not present

## 2016-07-04 DIAGNOSIS — S51011A Laceration without foreign body of right elbow, initial encounter: Secondary | ICD-10-CM | POA: Diagnosis not present

## 2016-07-04 DIAGNOSIS — S24109A Unspecified injury at unspecified level of thoracic spinal cord, initial encounter: Secondary | ICD-10-CM | POA: Diagnosis not present

## 2016-07-04 LAB — CBC WITH DIFFERENTIAL/PLATELET
BASOS ABS: 0 10*3/uL (ref 0.0–0.1)
BASOS PCT: 1 %
EOS PCT: 9 %
Eosinophils Absolute: 0.6 10*3/uL (ref 0.0–0.7)
HEMATOCRIT: 33.1 % — AB (ref 39.0–52.0)
Hemoglobin: 11.1 g/dL — ABNORMAL LOW (ref 13.0–17.0)
LYMPHS PCT: 6 %
Lymphs Abs: 0.4 10*3/uL — ABNORMAL LOW (ref 0.7–4.0)
MCH: 29.1 pg (ref 26.0–34.0)
MCHC: 33.5 g/dL (ref 30.0–36.0)
MCV: 86.9 fL (ref 78.0–100.0)
MONO ABS: 0.4 10*3/uL (ref 0.1–1.0)
Monocytes Relative: 5 %
NEUTROS ABS: 5.6 10*3/uL (ref 1.7–7.7)
Neutrophils Relative %: 79 %
PLATELETS: 230 10*3/uL (ref 150–400)
RBC: 3.81 MIL/uL — AB (ref 4.22–5.81)
RDW: 14.6 % (ref 11.5–15.5)
WBC: 7.1 10*3/uL (ref 4.0–10.5)

## 2016-07-04 LAB — BASIC METABOLIC PANEL
Anion gap: 7 (ref 5–15)
BUN: 19 mg/dL (ref 6–20)
CHLORIDE: 99 mmol/L — AB (ref 101–111)
CO2: 27 mmol/L (ref 22–32)
Calcium: 8.5 mg/dL — ABNORMAL LOW (ref 8.9–10.3)
Creatinine, Ser: 0.89 mg/dL (ref 0.61–1.24)
Glucose, Bld: 119 mg/dL — ABNORMAL HIGH (ref 65–99)
POTASSIUM: 4 mmol/L (ref 3.5–5.1)
SODIUM: 133 mmol/L — AB (ref 135–145)

## 2016-07-04 LAB — TROPONIN I: Troponin I: <0.03 ng/mL (ref <0.03)

## 2016-07-04 LAB — URINALYSIS, ROUTINE W REFLEX MICROSCOPIC
Bilirubin Urine: NEGATIVE
Glucose, UA: NEGATIVE mg/dL
Hgb urine dipstick: NEGATIVE
Ketones, ur: 5 mg/dL — AB
LEUKOCYTES UA: NEGATIVE
NITRITE: NEGATIVE
PROTEIN: NEGATIVE mg/dL
SPECIFIC GRAVITY, URINE: 1.014 (ref 1.005–1.030)
pH: 7 (ref 5.0–8.0)

## 2016-07-04 LAB — PROTIME-INR
INR: 1.1
PROTHROMBIN TIME: 14.2 s (ref 11.4–15.2)

## 2016-07-04 LAB — POC OCCULT BLOOD, ED: Fecal Occult Bld: NEGATIVE

## 2016-07-04 LAB — TSH: TSH: 1.225 u[IU]/mL (ref 0.350–4.500)

## 2016-07-04 MED ORDER — FAMOTIDINE 20 MG PO TABS
10.0000 mg | ORAL_TABLET | Freq: Every day | ORAL | Status: DC
  Administered 2016-07-04 – 2016-07-05 (×2): 10 mg via ORAL
  Filled 2016-07-04 (×2): qty 1

## 2016-07-04 MED ORDER — CHOLECALCIFEROL 10 MCG (400 UNIT) PO TABS
400.0000 [IU] | ORAL_TABLET | Freq: Every morning | ORAL | Status: DC
  Administered 2016-07-05: 400 [IU] via ORAL
  Filled 2016-07-04: qty 1

## 2016-07-04 MED ORDER — ACETAMINOPHEN 650 MG RE SUPP: 650.0000 mg | Freq: Four times a day (QID) | RECTAL | Status: DC | PRN

## 2016-07-04 MED ORDER — SELEGILINE HCL 5 MG PO CAPS
5.0000 mg | ORAL_CAPSULE | Freq: Two times a day (BID) | ORAL | Status: DC
  Administered 2016-07-05 (×2): 5 mg via ORAL
  Filled 2016-07-04 (×2): qty 1

## 2016-07-04 MED ORDER — CARBIDOPA-LEVODOPA 25-100 MG PO TABS
0.5000 | ORAL_TABLET | ORAL | Status: DC
  Administered 2016-07-04 – 2016-07-05 (×3): 0.5 via ORAL
  Filled 2016-07-04 (×3): qty 1

## 2016-07-04 MED ORDER — DOCUSATE SODIUM 100 MG PO CAPS
200.0000 mg | ORAL_CAPSULE | Freq: Every day | ORAL | Status: DC
  Administered 2016-07-04: 200 mg via ORAL
  Filled 2016-07-04: qty 2

## 2016-07-04 MED ORDER — HEPARIN SODIUM (PORCINE) 5000 UNIT/ML IJ SOLN
5000.0000 [IU] | Freq: Three times a day (TID) | INTRAMUSCULAR | Status: DC
  Administered 2016-07-04 – 2016-07-05 (×2): 5000 [IU] via SUBCUTANEOUS
  Filled 2016-07-04 (×2): qty 1

## 2016-07-04 MED ORDER — CARBIDOPA-LEVODOPA 25-100 MG PO TABS
1.0000 | ORAL_TABLET | Freq: Every day | ORAL | Status: DC
  Administered 2016-07-05: 1 via ORAL
  Filled 2016-07-04: qty 1

## 2016-07-04 MED ORDER — CHLORHEXIDINE GLUCONATE 0.12 % MT SOLN
15.0000 mL | Freq: Two times a day (BID) | OROMUCOSAL | Status: DC
  Administered 2016-07-04: 15 mL via OROMUCOSAL
  Filled 2016-07-04: qty 15

## 2016-07-04 MED ORDER — LUBIPROSTONE 24 MCG PO CAPS
24.0000 ug | ORAL_CAPSULE | Freq: Two times a day (BID) | ORAL | Status: DC
  Administered 2016-07-04 – 2016-07-05 (×2): 24 ug via ORAL
  Filled 2016-07-04 (×3): qty 1

## 2016-07-04 MED ORDER — SODIUM CHLORIDE 0.9 % IV SOLN
INTRAVENOUS | Status: DC
  Administered 2016-07-05: 02:00:00 via INTRAVENOUS

## 2016-07-04 MED ORDER — ACETAMINOPHEN 325 MG PO TABS
650.0000 mg | ORAL_TABLET | Freq: Four times a day (QID) | ORAL | Status: DC | PRN
  Administered 2016-07-04 – 2016-07-05 (×4): 650 mg via ORAL
  Filled 2016-07-04 (×4): qty 2

## 2016-07-04 MED ORDER — CARBIDOPA-LEVODOPA 25-250 MG PO TABS
1.0000 | ORAL_TABLET | ORAL | Status: DC
  Administered 2016-07-04 – 2016-07-05 (×3): 1 via ORAL
  Filled 2016-07-04 (×4): qty 1

## 2016-07-04 MED ORDER — TETANUS-DIPHTH-ACELL PERTUSSIS 5-2.5-18.5 LF-MCG/0.5 IM SUSP
0.5000 mL | Freq: Once | INTRAMUSCULAR | Status: AC
  Administered 2016-07-04: 0.5 mL via INTRAMUSCULAR
  Filled 2016-07-04: qty 0.5

## 2016-07-04 MED ORDER — ONDANSETRON HCL 4 MG/2ML IJ SOLN: 4.0000 mg | Freq: Four times a day (QID) | INTRAMUSCULAR | Status: DC | PRN

## 2016-07-04 MED ORDER — ASPIRIN EC 81 MG PO TBEC
81.0000 mg | DELAYED_RELEASE_TABLET | Freq: Every day | ORAL | Status: DC
  Administered 2016-07-04 – 2016-07-05 (×2): 81 mg via ORAL
  Filled 2016-07-04 (×2): qty 1

## 2016-07-04 MED ORDER — HYDROCODONE-ACETAMINOPHEN 5-325 MG PO TABS
1.0000 | ORAL_TABLET | ORAL | Status: DC | PRN
  Filled 2016-07-04: qty 2

## 2016-07-04 MED ORDER — CYANOCOBALAMIN 500 MCG PO TABS
500.0000 ug | ORAL_TABLET | Freq: Every morning | ORAL | Status: DC
  Administered 2016-07-05: 500 ug via ORAL
  Filled 2016-07-04: qty 1

## 2016-07-04 MED ORDER — SODIUM CHLORIDE 0.9% FLUSH
3.0000 mL | Freq: Two times a day (BID) | INTRAVENOUS | Status: DC
  Administered 2016-07-04: 3 mL via INTRAVENOUS

## 2016-07-04 MED ORDER — ONDANSETRON HCL 4 MG PO TABS: 4.0000 mg | ORAL_TABLET | Freq: Four times a day (QID) | ORAL | Status: DC | PRN

## 2016-07-04 MED ORDER — SODIUM CHLORIDE 0.9 % IV SOLN
INTRAVENOUS | Status: DC
  Administered 2016-07-04: 14:00:00 via INTRAVENOUS

## 2016-07-04 MED ORDER — OMEGA-3-ACID ETHYL ESTERS 1 G PO CAPS
1.0000 g | ORAL_CAPSULE | Freq: Every day | ORAL | Status: DC
  Administered 2016-07-05: 1 g via ORAL
  Filled 2016-07-04: qty 1

## 2016-07-04 MED ORDER — CARBIDOPA-LEVODOPA ER 48.75-195 MG PO CPCR
1.0000 | ORAL_CAPSULE | ORAL | Status: DC
  Administered 2016-07-04: 1 via ORAL

## 2016-07-04 MED ORDER — TRIAMCINOLONE ACETONIDE 55 MCG/ACT NA AERO
2.0000 | INHALATION_SPRAY | NASAL | Status: DC | PRN
  Filled 2016-07-04: qty 10.8

## 2016-07-04 MED ORDER — PRAMIPEXOLE DIHYDROCHLORIDE 1 MG PO TABS
1.0000 mg | ORAL_TABLET | Freq: Three times a day (TID) | ORAL | Status: DC
  Administered 2016-07-04 – 2016-07-05 (×4): 1 mg via ORAL
  Filled 2016-07-04 (×4): qty 1

## 2016-07-04 MED ORDER — ADULT MULTIVITAMIN W/MINERALS CH
1.0000 | ORAL_TABLET | Freq: Every morning | ORAL | Status: DC
  Administered 2016-07-05: 1 via ORAL
  Filled 2016-07-04: qty 1

## 2016-07-04 MED ORDER — ORAL CARE MOUTH RINSE
15.0000 mL | Freq: Two times a day (BID) | OROMUCOSAL | Status: DC
  Administered 2016-07-05: 15 mL via OROMUCOSAL

## 2016-07-04 NOTE — ED Notes (Signed)
Patient transported to CT 

## 2016-07-04 NOTE — ED Notes (Signed)
Patient transported to X-ray 

## 2016-07-04 NOTE — ED Triage Notes (Signed)
Per EMS, pt from [facility], was coming out of the BR, became dizzy and lightheaded, fell, face first, hitting his forehead on a hardwood floor.  Skin tear to E elbow.  Pt reports R thoracic pain and bila leg pain.  Hematoma noted to R forehead, A&O x 4.  Denies LOC. BP-70 palpated per EMS, 500CC IV administered en route.  BP-120 palpated.  CBG 127, HR 70.  EKG-first degree block, occasional PVC.

## 2016-07-04 NOTE — ED Provider Notes (Signed)
[facility] DEPT Provider Note   CSN: [id number] Arrival date & time: 07/04/16  0944     History   Chief Complaint Chief Complaint  Patient presents with  . Fall    HPI Brian Aguirre is a 83 y.o. male.  HPI  Pt was seen at 1010. Per EMS, [facility], and pt: Pt c/o sudden onset and resolution of one episode of fall that occurred PTA. Pt states he was coming out of the bathroom, felt lightheaded, and fell onto a hardwood floor.  Pt c/o hitting right head, right elbow pain, right thoracic back pain. Pt denies LOC.  EMS states pt's SBP was "70" on scene, IVF NS 536ml given en route with SBP "120" by arrival to ED. Pt denies any other areas of pain. Denies CP/palpitations, no SOB/cough, no abd pain, no N/V/D, no focal motor weakness, no tingling/numbness in extremities.    Past Medical History:  Diagnosis Date  . Benign enlargement of prostate   . Chronic constipation 02/26/2014  . Contusion of forehead 03/09/2014  . Depression   . Dizziness   . Fracture, rib   . Gait disturbance   . History of fall 03/09/2014  . Hypertension   . Memory difficulties 07/20/2014  . Memory loss   . Orthostatic hypotension   . Osteoporosis   . Parkinson disease [facility])     Patient Active Problem List   Diagnosis Date Noted  . Memory difficulties 07/20/2014  . Autonomic dysfunction 03/25/2014  . Orthostatic syncope 03/23/2014  . Parkinson disease ([place]) 03/23/2014  . History of fall 03/09/2014  . Contusion of forehead 03/09/2014  . Chronic constipation 02/26/2014  . Oral aphthous ulcer 12/30/2013  . Hyponatremia 12/23/2013  . Acute confusional state 12/23/2013  . Left hip pain 12/19/2013  . Constipation 12/19/2013  . GERD (gastroesophageal reflux disease) 12/19/2013  . Edema 12/19/2013  . Urgency of urination 02/15/2012  . Orthostatic hypotension 02/15/2012  . Dizziness and giddiness 02/15/2012  . Abnormality of gait 02/15/2012    Past Surgical History:  Procedure Laterality  Date  . APPENDECTOMY    . CATARACT EXTRACTION Left   . HERNIA REPAIR    . TONSILLECTOMY         Home Medications    Prior to Admission medications   Medication Sig Start Date End Date Taking? Authorizing Provider  AMITIZA 24 MCG capsule TAKE 1 CAPSULE TWICE DAILY WITH MEALS. 05/23/16   Kathrynn Ducking, MD  aspirin EC 81 MG tablet Take 81 mg by mouth at bedtime.    [provider]  carbidopa-levodopa (SINEMET IR) 25-100 MG tablet TAKE 1 TABLET 3 TIMES A DAY. 06/22/16   Kathrynn Ducking, MD  carbidopa-levodopa (SINEMET IR) 25-250 MG tablet TAKE  (1)  TABLET  FOUR TIMES DAILY. 12/01/15   Kathrynn Ducking, MD  cloNIDine (CATAPRES) 0.1 MG tablet Take 1 tablet (0.1 mg total) by mouth daily as needed. Take as needed for sustain blood pressure greater than 696 systolic 2/95/28   Kathrynn Ducking, MD  docusate sodium (COLACE) 100 MG capsule Take 200 mg by mouth at bedtime.     [provider]  fish oil-omega-3 fatty acids 1000 MG capsule Take 1 g by mouth daily with breakfast.     [provider]  Multiple Vitamin (MULTIVITAMIN WITH MINERALS) TABS Take 1 tablet by mouth every morning.     [provider]  pramipexole (MIRAPEX) 1 MG tablet TAKE 1 TABLET 3 TIMES A DAY. 06/02/16   Jannifer Franklin,  Elon Alas, MD  Probiotic Product (ALIGN PO) Take 1 capsule by mouth daily.    [provider]  ranitidine (ZANTAC) 75 MG tablet Take 75 mg by mouth daily as needed for heartburn (reflux).     [provider]  RYTARY [phone] MG CPCR TAKE 1 CAPSULE DAILY AFTER SUPPER. 05/16/16   Kathrynn Ducking, MD  selegiline (ELDEPRYL) 5 MG capsule TAKE 1 CAPSULE TWICE DAILY WITH BREAKFAST AND LUNCH. 09/30/15   Kathrynn Ducking, MD  triamcinolone (NASACORT) 55 MCG/ACT AERO nasal inhaler Place 2 sprays into the nose as needed (allergies).    [provider]  vitamin B-12 (CYANOCOBALAMIN) 500 MCG tablet Take 500 mcg by mouth every morning.     [provider]    vitamin D, CHOLECALCIFEROL, 400 UNITS tablet Take 400 Units by mouth every morning.    [provider]    Family History Family History  Problem Relation Age of Onset  . Cancer Mother   . Heart failure Father   . Breast cancer Daughter     Social History Social History  Substance Use Topics  . Smoking status: Never Smoker  . Smokeless tobacco: Never Used  . Alcohol use No     Allergies   Myrbetriq [mirabegron] and Stalevo [carbidopa-levodopa-entacapone]   Review of Systems Review of Systems ROS: Statement: All systems negative except as marked or noted in the HPI; Constitutional: Negative for fever and chills. ; ; Eyes: Negative for eye pain, redness and discharge. ; ; ENMT: Negative for ear pain, hoarseness, nasal congestion, sinus pressure and sore throat. ; ; Cardiovascular: Negative for chest pain, palpitations, diaphoresis, dyspnea and peripheral edema. ; ; Respiratory: Negative for cough, wheezing and stridor. ; ; Gastrointestinal: Negative for nausea, vomiting, diarrhea, abdominal pain, blood in stool, hematemesis, jaundice and rectal bleeding. . ; ; Genitourinary: Negative for dysuria, flank pain and hematuria. ; ; Musculoskeletal: +head injury, +back pain. Negative for swelling and deformity.; ; Skin: +abrasion. Negative for pruritus, rash, blisters, bruising and skin lesion.; ; Neuro: Negative for headache, lightheadedness and neck stiffness. Negative for weakness, altered level of consciousness, altered mental status, extremity weakness, paresthesias, involuntary movement, seizure and syncope.      Physical Exam Updated Vital Signs BP 137/64 (BP Location: Left Arm)   Pulse 63   SpO2 100%    10:17:32 Vital Signs MT  Vital Signs  Pulse Rate Source: Monitor  BP:  124/93  MAP (mmHg): 97  BP Location: Left Arm  BP Method: Automatic  Patient Position (if appropriate): Lying          10:23:52 Vital Signs MT  Vital Signs  Pulse Rate: 63  Pulse Rate  Source: Monitor  BP: 137/64  MAP (mmHg): 86  BP Location: Left Arm  BP Method: Automatic  Patient Position (if appropriate): Sitting      10:28:59 Vital Signs MT  Vital Signs  Pulse Rate: 68  Pulse Rate Source: Monitor  BP:  118/98  MAP (mmHg): 107  BP Location: Left Arm  BP Method: Automatic  Patient Position (if appropriate): Standing      Oxygen Therapy  SpO2: 98 %  O2 Device: Room Air     Physical Exam 1015: Physical examination:  Nursing notes reviewed; Vital signs and O2 SAT reviewed;  Constitutional: Well developed, Well nourished, Well hydrated, In no acute distress; Head:  Normocephalic, +contusion right forehead.; Eyes: EOMI, PERRL, No scleral icterus; ENMT: Mouth and pharynx normal, Mucous membranes moist; Neck: Supple, Full range of  motion, No lymphadenopathy; Cardiovascular: Regular rate and rhythm, No gallop; Respiratory: Breath sounds clear & equal bilaterally, No wheezes.  Speaking full sentences with ease, Normal respiratory effort/excursion; Chest: Nontender, Movement normal; Abdomen: Soft, Nontender, Nondistended, Normal bowel sounds. Rectal exam performed w/permission of pt and ED RN chaperone present.  Anal tone normal.  Non-tender, soft brown stool in rectal vault, heme neg.  No fissures, no external hemorrhoids, no palp masses.;; Genitourinary: No CVA tenderness; Spine:  No midline CS, TS, LS tenderness. +mild right thoracic back tenderness to palp.;; Extremities: Pulses normal, +skin tear right elbow. NT bilat UE's and LE's. No deformity. Pt is able to lift extended bilat LE's up off stretcher without difficulty/pain. No edema, No calf edema or asymmetry.; Neuro: AA&Ox3, Major CN grossly intact.  Speech clear. No gross focal motor or sensory deficits in extremities.; Skin: Color normal, Warm, Dry.   ED Treatments / Results  Labs (all labs ordered are listed, but only abnormal results are displayed)   EKG  EKG Interpretation  Date/Time:  Tuesday July 04 2016 10:34:07 EDT Ventricular Rate:  62 PR Interval:    QRS Duration: 124 QT Interval:  437 QTC Calculation: 444 R Axis:   -42 Text Interpretation:  Sinus rhythm Prolonged PR interval Left axis deviation Left bundle branch block T wave abnormality in leads V5 and V6 When compared with ECG of 07/06/2014 T wave abnormality in leads V5 and V6 is now Present Confirmed by [facility]  MD, Nunzio Cory [phone]) on 07/04/2016 11:56:01 AM       Radiology   Procedures Procedures (including critical care time)  Medications Ordered in ED Medications - No data to display   Initial Impression / Assessment and Plan / ED Course  I have reviewed the triage vital signs and the nursing notes.  Pertinent labs & imaging results that were available during my care of the patient were reviewed by me and considered in my medical decision making (see chart for details).  MDM Reviewed: previous chart, nursing note and vitals Reviewed previous: labs and ECG Interpretation: labs, ECG, x-ray and CT scan   Results for orders placed or performed during the hospital encounter of 07/04/16  Urinalysis, Routine w reflex microscopic  Result Value Ref Range   Color, Urine YELLOW YELLOW   APPearance CLEAR CLEAR   Specific Gravity, Urine 1.014 1.005 - 1.030   pH 7.0 5.0 - 8.0   Glucose, UA NEGATIVE NEGATIVE mg/dL   Hgb urine dipstick NEGATIVE NEGATIVE   Bilirubin Urine NEGATIVE NEGATIVE   Ketones, ur 5 (A) NEGATIVE mg/dL   Protein, ur NEGATIVE NEGATIVE mg/dL   Nitrite NEGATIVE NEGATIVE   Leukocytes, UA NEGATIVE NEGATIVE  Basic metabolic panel  Result Value Ref Range   Sodium 133 (L) 135 - 145 mmol/L   Potassium 4.0 3.5 - 5.1 mmol/L   Chloride 99 (L) 101 - 111 mmol/L   CO2 27 22 - 32 mmol/L   Glucose, Bld 119 (H) 65 - 99 mg/dL   BUN 19 6 - 20 mg/dL   Creatinine, Ser 0.89 0.61 - 1.24 mg/dL   Calcium 8.5 (L) 8.9 - 10.3 mg/dL   GFR calc non Af Amer >60 >60 mL/min   GFR calc Af Amer >60 >60 mL/min   Anion gap 7  5 - 15  Troponin I  Result Value Ref Range   Troponin I <0.03 <0.03 ng/mL  CBC with Differential  Result Value Ref Range   WBC 7.1 4.0 - 10.5 K/uL   RBC 3.81 (  L) 4.22 - 5.81 MIL/uL   Hemoglobin 11.1 (L) 13.0 - 17.0 g/dL   HCT 33.1 (L) 39.0 - 52.0 %   MCV 86.9 78.0 - 100.0 fL   MCH 29.1 26.0 - 34.0 pg   MCHC 33.5 30.0 - 36.0 g/dL   RDW 14.6 11.5 - 15.5 %   Platelets 230 150 - 400 K/uL   Neutrophils Relative % 79 %   Neutro Abs 5.6 1.7 - 7.7 K/uL   Lymphocytes Relative 6 %   Lymphs Abs 0.4 (L) 0.7 - 4.0 K/uL   Monocytes Relative 5 %   Monocytes Absolute 0.4 0.1 - 1.0 K/uL   Eosinophils Relative 9 %   Eosinophils Absolute 0.6 0.0 - 0.7 K/uL   Basophils Relative 1 %   Basophils Absolute 0.0 0.0 - 0.1 K/uL   Dg Thoracic Spine W/swimmers Result Date: 07/04/2016 CLINICAL DATA:  Fall.  Right back EXAM: THORACIC SPINE - 3 VIEWS COMPARISON:  03/09/2014.  Today's lumbar spine series. FINDINGS: Diffuse anterior flowing osteophyte. This may reflect changes in DISH or ankylosing spondylitis (favored). No fracture or malalignment. IMPRESSION: Changes suspicious for ankylosing spondylitis. No acute bony abnormality. Electronically Signed   By: Rolm Baptise M.D.   On: 07/04/2016 11:22   Dg Lumbar Spine Complete Result Date: 07/04/2016 CLINICAL DATA:  Status post fall this morning. The patient reports right upper lumbar region pain as well as generalized pain. EXAM: LUMBAR SPINE - COMPLETE 4+ VIEW COMPARISON:  None in PACs FINDINGS: The lumbar vertebral bodies are preserved in height. There is gentle curvature convex toward the left centered at L2-3. The pedicles are intact. There is mild disc space narrowing at L4-5. There is no spondylolisthesis. There is facet joint hypertrophy at L4-5 and at L5-S1. IMPRESSION: There is no acute bony abnormality of the lumbar spine. There is mild degenerative change as described. Electronically Signed   By: David  Martinique M.D.   On: 07/04/2016 11:20   Dg Elbow  Complete Right Result Date: 07/04/2016 CLINICAL DATA:  [place] this morning, laceration to posterior RIGHT elbow EXAM: RIGHT ELBOW - COMPLETE 3+ VIEW COMPARISON:  None FINDINGS: Osseous mineralization well maintained for age. Joint spaces preserved. No acute fracture, dislocation, or bone destruction. No elbow joint effusion. IMPRESSION: No acute osseous abnormalities. Electronically Signed   By: Lavonia Dana M.D.   On: 07/04/2016 11:21   Ct Head Wo Contrast Result Date: 07/04/2016 CLINICAL DATA:  83 year old male post fall after becoming dizzy and lightheaded. Hit forehead. Denies loss of consciousness. Initial encounter. EXAM: CT HEAD WITHOUT CONTRAST CT CERVICAL SPINE WITHOUT CONTRAST TECHNIQUE: Multidetector CT imaging of the head and cervical spine was performed following the standard protocol without intravenous contrast. Multiplanar CT image reconstructions of the cervical spine were also generated. COMPARISON:  03/23/2014 head CT. 03/09/2014 head CT and cervical spine CT. FINDINGS: CT HEAD FINDINGS Brain: No intracranial hemorrhage or CT evidence of large acute infarct. Prominent chronic microvascular changes. Atrophy. No intracranial mass lesion noted on this unenhanced exam. Vascular: Vascular calcifications Skull: No skull fracture Sinuses/Orbits: Post lens replacement without acute orbital abnormality. Mucosal thickening maxillary sinuses with small air-fluid level. No orbital floor fracture noted. Opacification ethmoid sinus air cells bilaterally. Mucosal thickening right sphenoid sinus. Other: Mastoid air cells and middle ear cavities are clear. Right frontal-temporal region subcutaneous hematoma. CT CERVICAL SPINE FINDINGS Alignment: Normal alignment. Skull base and vertebrae: No cervical spine fracture. Congenital nonunion C1 ring once again noted. Soft tissues and spinal canal: No abnormal prevertebral soft tissue  swelling. Cervical spondylotic changes most notable C5-6 and C6-7 with narrowing  ventral thecal sac and minimal cord flattening. Upper chest: Scarring lung apices. Other: Negative IMPRESSION: Right frontal-temporal region subcutaneous hematoma without underlying fracture or intracranial hemorrhage. Chronic microvascular changes. Global atrophy. Opacification/mucosal thickening maxillary sinuses, ethmoid sinus air cells and right sphenoid sinus. No cervical spine fracture, abnormal prevertebral soft tissue swelling or malalignment. Cervical spondylotic changes C5-6 and C6-7 with ventral thecal sac narrowing and slight cord flattening without significant change. Electronically Signed   By: Genia Del M.D.   On: 07/04/2016 11:51   Ct Cervical Spine Wo Contrast Result Date: 07/04/2016 CLINICAL DATA:  83 year old male post fall after becoming dizzy and lightheaded. Hit forehead. Denies loss of consciousness. Initial encounter. EXAM: CT HEAD WITHOUT CONTRAST CT CERVICAL SPINE WITHOUT CONTRAST TECHNIQUE: Multidetector CT imaging of the head and cervical spine was performed following the standard protocol without intravenous contrast. Multiplanar CT image reconstructions of the cervical spine were also generated. COMPARISON:  03/23/2014 head CT. 03/09/2014 head CT and cervical spine CT. FINDINGS: CT HEAD FINDINGS Brain: No intracranial hemorrhage or CT evidence of large acute infarct. Prominent chronic microvascular changes. Atrophy. No intracranial mass lesion noted on this unenhanced exam. Vascular: Vascular calcifications Skull: No skull fracture Sinuses/Orbits: Post lens replacement without acute orbital abnormality. Mucosal thickening maxillary sinuses with small air-fluid level. No orbital floor fracture noted. Opacification ethmoid sinus air cells bilaterally. Mucosal thickening right sphenoid sinus. Other: Mastoid air cells and middle ear cavities are clear. Right frontal-temporal region subcutaneous hematoma. CT CERVICAL SPINE FINDINGS Alignment: Normal alignment. Skull base and  vertebrae: No cervical spine fracture. Congenital nonunion C1 ring once again noted. Soft tissues and spinal canal: No abnormal prevertebral soft tissue swelling. Cervical spondylotic changes most notable C5-6 and C6-7 with narrowing ventral thecal sac and minimal cord flattening. Upper chest: Scarring lung apices. Other: Negative IMPRESSION: Right frontal-temporal region subcutaneous hematoma without underlying fracture or intracranial hemorrhage. Chronic microvascular changes. Global atrophy. Opacification/mucosal thickening maxillary sinuses, ethmoid sinus air cells and right sphenoid sinus. No cervical spine fracture, abnormal prevertebral soft tissue swelling or malalignment. Cervical spondylotic changes C5-6 and C6-7 with ventral thecal sac narrowing and slight cord flattening without significant change. Electronically Signed   By: Genia Del M.D.   On: 07/04/2016 11:51    1245:  New EKG changes today. H/H lower than previous; stool heme negative.Dx and testing d/w pt and family.  Questions answered.  Verb understanding, agreeable to admit.  T/C to [facility] [place] Poli, case discussed, including:  HPI, pertinent PM/SHx, VS/PE, dx testing, ED course and treatment:  Agreeable to come to ED for evaluation for admission.   Final Clinical Impressions(s) / ED Diagnoses   Final diagnoses:  Fall    New Prescriptions New Prescriptions   No medications on file     Francine Graven, DO 07/07/16 1751

## 2016-07-04 NOTE — H&P (Signed)
History and Physical    Brian Aguirre NWG:[id number] DOB: 21-May-1929 DOA: 07/04/2016  PCP: Lajean Manes, MD  Patient coming from: [facility] home [place], independent living part  Chief Complaint: Syncope  HPI: Brian Aguirre is a 83 y.o. male with medical history significant of Parkinson disease, associate with autonomic dysfunction and orthostatic hypotension. Patient lives and friend [place] the independent living part, uses walker for ambulation has frequent episodes of dizziness and giddiness. He mentioned this morning he was coming back from the bathroom developed dizziness and lost consciousness and fell. He hit his head secured to the hospital for further evaluation. In the ED CT scan of the head showed no fractures or bleeding, small subcutaneous hematoma  ED Course:  Vitals: Blood pressure elevated last reading was 180/67 Labs: Normal except mild hyponatremia was 133 Imaging: CT without fracture or bleeding Interventions: Referred for observation  Review of Systems:  12 point review of system is negative except for the symptoms mentioned in the history of present illness.  Past Medical History:  Diagnosis Date  . Benign enlargement of prostate   . Chronic constipation 02/26/2014  . Contusion of forehead 03/09/2014  . Depression   . Dizziness   . Fracture, rib   . Gait disturbance   . History of fall 03/09/2014  . Hypertension   . Memory difficulties 07/20/2014  . Memory loss   . Orthostatic hypotension   . Osteoporosis   . Parkinson disease [facility])     Past Surgical History:  Procedure Laterality Date  . APPENDECTOMY    . CATARACT EXTRACTION Left   . HERNIA REPAIR    . TONSILLECTOMY       reports that he has never smoked. He has never used smokeless tobacco. He reports that he does not drink alcohol or use drugs.  Allergies  Allergen Reactions  . Myrbetriq [Mirabegron] Nausea And Vomiting  . Stalevo [Carbidopa-Levodopa-Entacapone] Diarrhea    GI upset    Family  History  Problem Relation Age of Onset  . Cancer Mother   . Heart failure Father   . Breast cancer Daughter     Prior to Admission medications   Medication Sig Start Date End Date Taking? Authorizing Provider  AMITIZA 24 MCG capsule TAKE 1 CAPSULE TWICE DAILY WITH MEALS. 05/23/16  Yes Kathrynn Ducking, MD  aspirin EC 81 MG tablet Take 81 mg by mouth at bedtime.   Yes [provider]  carbidopa-levodopa (SINEMET IR) 25-100 MG tablet TAKE 1 TABLET 3 TIMES A DAY. 06/22/16  Yes Kathrynn Ducking, MD  carbidopa-levodopa (SINEMET IR) 25-250 MG tablet TAKE  (1)  TABLET  FOUR TIMES DAILY. 12/01/15  Yes Kathrynn Ducking, MD  docusate sodium (COLACE) 100 MG capsule Take 200 mg by mouth at bedtime.    Yes [provider]  fish oil-omega-3 fatty acids 1000 MG capsule Take 1 g by mouth daily with breakfast.    Yes [provider]  Multiple Vitamin (MULTIVITAMIN WITH MINERALS) TABS Take 1 tablet by mouth every morning.    Yes [provider]  pramipexole (MIRAPEX) 1 MG tablet TAKE 1 TABLET 3 TIMES A DAY. 06/02/16  Yes Kathrynn Ducking, MD  Probiotic Product (ALIGN PO) Take 1 capsule by mouth daily.   Yes [provider]  ranitidine (ZANTAC) 75 MG tablet Take 75 mg by mouth daily as needed for heartburn (reflux).    Yes [provider]  RYTARY 48.75-195 MG CPCR TAKE 1 CAPSULE DAILY AFTER SUPPER. 05/16/16  Yes Kathrynn Ducking, MD  selegiline (ELDEPRYL) 5 MG capsule TAKE 1 CAPSULE TWICE DAILY WITH BREAKFAST AND LUNCH. 09/30/15  Yes Kathrynn Ducking, MD  triamcinolone (NASACORT) 55 MCG/ACT AERO nasal inhaler Place 2 sprays into the nose as needed (allergies).   Yes [provider]  vitamin B-12 (CYANOCOBALAMIN) 500 MCG tablet Take 500 mcg by mouth every morning.    Yes [provider]  vitamin D, CHOLECALCIFEROL, 400 UNITS tablet Take 400 Units by mouth every morning.   Yes [provider]  cloNIDine (CATAPRES) 0.1 MG tablet Take  1 tablet (0.1 mg total) by mouth daily as needed. Take as needed for sustain blood pressure greater than 017 systolic Patient not taking: Reported on 07/04/2016 05/08/16   Kathrynn Ducking, MD    Physical Exam:  Vitals:   07/04/16 1130 07/04/16 1200 07/04/16 1230 07/04/16 1344  BP:  (!) 178/76 (!) 179/99 (!) 183/67  Pulse:  (!) 57 (!) 102 60  Resp:  18 18 16   Temp: 97.5 F (36.4 C)     TempSrc: Oral     SpO2:  92% 96% 96%    Constitutional: Chronically ill looking elderly white male NAD, calm, comfortable, Very soft voice Eyes: PERRL, lids and conjunctivae normal ENMT: Mucous membranes are moist. Posterior pharynx clear of any exudate or lesions.Normal dentition.  Neck: normal, supple, no masses, no thyromegaly Respiratory: clear to auscultation bilaterally, no wheezing, no crackles. Normal respiratory effort. No accessory muscle use.  Cardiovascular: Regular rate and rhythm, no murmurs / rubs / gallops. No extremity edema. 2+ pedal pulses. No carotid bruits.  Abdomen: no tenderness, no masses palpated. No hepatosplenomegaly. Bowel sounds positive.  Musculoskeletal: no clubbing / cyanosis. No joint deformity upper and lower extremities. Good ROM, no contractures. Normal muscle tone.  Skin: no rashes, lesions, ulcers. No induration Neurologic: CN 2-12 grossly intact. Sensation intact, DTR normal. Strength 5/5 in all 4.  Psychiatric: Normal judgment and insight. Alert and oriented x 3. Normal mood.   Labs on Admission: I have personally reviewed following labs and imaging studies  CBC:  Recent Labs Lab 07/04/16 1038  WBC 7.1  NEUTROABS 5.6  HGB 11.1*  HCT 33.1*  MCV 86.9  PLT 494   Basic Metabolic Panel:  Recent Labs Lab 07/04/16 1038  NA 133*  K 4.0  CL 99*  CO2 27  GLUCOSE 119*  BUN 19  CREATININE 0.89  CALCIUM 8.5*   GFR: CrCl cannot be calculated (Unknown ideal weight.). Liver Function Tests: No results for input(s): AST, ALT, ALKPHOS, BILITOT, PROT,  ALBUMIN in the last 168 hours. No results for input(s): LIPASE, AMYLASE in the last 168 hours. No results for input(s): AMMONIA in the last 168 hours. Coagulation Profile: No results for input(s): INR, PROTIME in the last 168 hours. Cardiac Enzymes:  Recent Labs Lab 07/04/16 1038  TROPONINI <0.03   BNP (last 3 results) No results for input(s): PROBNP in the last 8760 hours. HbA1C: No results for input(s): HGBA1C in the last 72 hours. CBG: No results for input(s): GLUCAP in the last 168 hours. Lipid Profile: No results for input(s): CHOL, HDL, LDLCALC, TRIG, CHOLHDL, LDLDIRECT in the last 72 hours. Thyroid Function Tests: No results for input(s): TSH, T4TOTAL, FREET4, T3FREE, THYROIDAB in the last 72 hours. Anemia Panel: No results for input(s): VITAMINB12, FOLATE, FERRITIN, TIBC, IRON, RETICCTPCT in the last 72 hours. Urine analysis:    Component Value Date/Time   COLORURINE YELLOW 07/04/2016 1129   [place] 07/04/2016 1129  LABSPEC 1.014 07/04/2016 1129   PHURINE 7.0 07/04/2016 1129   GLUCOSEU NEGATIVE 07/04/2016 1129   HGBUR NEGATIVE 07/04/2016 Home 07/04/2016 1129   KETONESUR 5 (A) 07/04/2016 1129   PROTEINUR NEGATIVE 07/04/2016 1129   UROBILINOGEN 0.2 07/06/2014 1915   NITRITE NEGATIVE 07/04/2016 1129   LEUKOCYTESUR NEGATIVE 07/04/2016 1129   Sepsis Labs: !!!!!!!!!!!!!!!!!!!!!!!!!!!!!!!!!!!!!!!!!!!! Invalid input(s): PROCALCITONIN, LACTICIDVEN No results found for this or any previous visit (from the past 240 hour(s)).   Radiological Exams on Admission: Dg Thoracic Spine W/swimmers  Result Date: 07/04/2016 CLINICAL DATA:  Fall.  Right back EXAM: THORACIC SPINE - 3 VIEWS COMPARISON:  03/09/2014.  Today's lumbar spine series. FINDINGS: Diffuse anterior flowing osteophyte. This may reflect changes in DISH or ankylosing spondylitis (favored). No fracture or malalignment. IMPRESSION: Changes suspicious for ankylosing spondylitis. No  acute bony abnormality. Electronically Signed   By: Rolm Baptise M.D.   On: 07/04/2016 11:22   Dg Lumbar Spine Complete  Result Date: 07/04/2016 CLINICAL DATA:  Status post fall this morning. The patient reports right upper lumbar region pain as well as generalized pain. EXAM: LUMBAR SPINE - COMPLETE 4+ VIEW COMPARISON:  None in PACs FINDINGS: The lumbar vertebral bodies are preserved in height. There is gentle curvature convex toward the left centered at L2-3. The pedicles are intact. There is mild disc space narrowing at L4-5. There is no spondylolisthesis. There is facet joint hypertrophy at L4-5 and at L5-S1. IMPRESSION: There is no acute bony abnormality of the lumbar spine. There is mild degenerative change as described. Electronically Signed   By: David  Martinique M.D.   On: 07/04/2016 11:20   Dg Elbow Complete Right  Result Date: 07/04/2016 CLINICAL DATA:  [place] this morning, laceration to posterior RIGHT elbow EXAM: RIGHT ELBOW - COMPLETE 3+ VIEW COMPARISON:  None FINDINGS: Osseous mineralization well maintained for age. Joint spaces preserved. No acute fracture, dislocation, or bone destruction. No elbow joint effusion. IMPRESSION: No acute osseous abnormalities. Electronically Signed   By: Lavonia Dana M.D.   On: 07/04/2016 11:21   Ct Head Wo Contrast  Result Date: 07/04/2016 CLINICAL DATA:  83 year old male post fall after becoming dizzy and lightheaded. Hit forehead. Denies loss of consciousness. Initial encounter. EXAM: CT HEAD WITHOUT CONTRAST CT CERVICAL SPINE WITHOUT CONTRAST TECHNIQUE: Multidetector CT imaging of the head and cervical spine was performed following the standard protocol without intravenous contrast. Multiplanar CT image reconstructions of the cervical spine were also generated. COMPARISON:  03/23/2014 head CT. 03/09/2014 head CT and cervical spine CT. FINDINGS: CT HEAD FINDINGS Brain: No intracranial hemorrhage or CT evidence of large acute infarct. Prominent chronic  microvascular changes. Atrophy. No intracranial mass lesion noted on this unenhanced exam. Vascular: Vascular calcifications Skull: No skull fracture Sinuses/Orbits: Post lens replacement without acute orbital abnormality. Mucosal thickening maxillary sinuses with small air-fluid level. No orbital floor fracture noted. Opacification ethmoid sinus air cells bilaterally. Mucosal thickening right sphenoid sinus. Other: Mastoid air cells and middle ear cavities are clear. Right frontal-temporal region subcutaneous hematoma. CT CERVICAL SPINE FINDINGS Alignment: Normal alignment. Skull base and vertebrae: No cervical spine fracture. Congenital nonunion C1 ring once again noted. Soft tissues and spinal canal: No abnormal prevertebral soft tissue swelling. Cervical spondylotic changes most notable C5-6 and C6-7 with narrowing ventral thecal sac and minimal cord flattening. Upper chest: Scarring lung apices. Other: Negative IMPRESSION: Right frontal-temporal region subcutaneous hematoma without underlying fracture or intracranial hemorrhage. Chronic microvascular changes. Global atrophy. Opacification/mucosal thickening maxillary sinuses, ethmoid sinus air  cells and right sphenoid sinus. No cervical spine fracture, abnormal prevertebral soft tissue swelling or malalignment. Cervical spondylotic changes C5-6 and C6-7 with ventral thecal sac narrowing and slight cord flattening without significant change. Electronically Signed   By: Genia Del M.D.   On: 07/04/2016 11:51   Ct Cervical Spine Wo Contrast  Result Date: 07/04/2016 CLINICAL DATA:  83 year old male post fall after becoming dizzy and lightheaded. Hit forehead. Denies loss of consciousness. Initial encounter. EXAM: CT HEAD WITHOUT CONTRAST CT CERVICAL SPINE WITHOUT CONTRAST TECHNIQUE: Multidetector CT imaging of the head and cervical spine was performed following the standard protocol without intravenous contrast. Multiplanar CT image reconstructions of the  cervical spine were also generated. COMPARISON:  03/23/2014 head CT. 03/09/2014 head CT and cervical spine CT. FINDINGS: CT HEAD FINDINGS Brain: No intracranial hemorrhage or CT evidence of large acute infarct. Prominent chronic microvascular changes. Atrophy. No intracranial mass lesion noted on this unenhanced exam. Vascular: Vascular calcifications Skull: No skull fracture Sinuses/Orbits: Post lens replacement without acute orbital abnormality. Mucosal thickening maxillary sinuses with small air-fluid level. No orbital floor fracture noted. Opacification ethmoid sinus air cells bilaterally. Mucosal thickening right sphenoid sinus. Other: Mastoid air cells and middle ear cavities are clear. Right frontal-temporal region subcutaneous hematoma. CT CERVICAL SPINE FINDINGS Alignment: Normal alignment. Skull base and vertebrae: No cervical spine fracture. Congenital nonunion C1 ring once again noted. Soft tissues and spinal canal: No abnormal prevertebral soft tissue swelling. Cervical spondylotic changes most notable C5-6 and C6-7 with narrowing ventral thecal sac and minimal cord flattening. Upper chest: Scarring lung apices. Other: Negative IMPRESSION: Right frontal-temporal region subcutaneous hematoma without underlying fracture or intracranial hemorrhage. Chronic microvascular changes. Global atrophy. Opacification/mucosal thickening maxillary sinuses, ethmoid sinus air cells and right sphenoid sinus. No cervical spine fracture, abnormal prevertebral soft tissue swelling or malalignment. Cervical spondylotic changes C5-6 and C6-7 with ventral thecal sac narrowing and slight cord flattening without significant change. Electronically Signed   By: Genia Del M.D.   On: 07/04/2016 11:51    EKG: Independently reviewed.   Assessment/Plan Principal Problem:   Syncope Active Problems:   Orthostatic hypotension   Dizziness and giddiness   Chronic constipation   Contusion of forehead   Parkinson disease  (HCC)   Autonomic dysfunction    Syncope -Patient was using his walker when he felt dizzy and lost his consciousness. -Has history of orthostatic hypotension secondary to autonomic dysfunction and advanced Parkinson's. -Keep patient overnight for observation. -Telemetry, 12-lead EKG showed old left bundle branch block. -2-D echo, rule out ACS cycle 3 sets of cardiac enzymes. -I honestly think this is secondary to dizziness from orthostatic hypotension. IV fluids.  Parkinson's disease -Advanced Parkinson disease, follows with [name]. -Continue all medications. -P reported he has autonomic dysfunction chronic dizziness/orthostatic hypotension.  Hypertension -Appears to have supine hypertension blood pressure drops when he stand up. -This is consistent with autonomic dysfunction, we'll be very careful with blood pressure medications. -Hydralazine at low dose of 5 mg every 6 hours as needed  Chronic constipation -On Amitiza this is continued.  Contusion of the forehead -Secondary to a fall   DVT prophylaxis: SQ Heparin Code Status: Full code Family Communication: Plan D/W patient in the presence of his wife and caregiver at bedside. Disposition Plan: Likely to go back to his independent living facility Consults called:  Admission status: Observation, telemetry   [facility] A MD [facility] Pager [phone]  If 7PM-7AM, please contact night-coverage [email or web address] Password [email or web address]  07/04/2016, 1:47 PM

## 2016-07-04 NOTE — ED Notes (Signed)
Family at bedside. And caregiver

## 2016-07-05 ENCOUNTER — Observation Stay (HOSPITAL_BASED_OUTPATIENT_CLINIC_OR_DEPARTMENT_OTHER): Payer: Medicare Other

## 2016-07-05 DIAGNOSIS — G909 Disorder of the autonomic nervous system, unspecified: Secondary | ICD-10-CM | POA: Diagnosis not present

## 2016-07-05 DIAGNOSIS — R42 Dizziness and giddiness: Secondary | ICD-10-CM

## 2016-07-05 DIAGNOSIS — I951 Orthostatic hypotension: Secondary | ICD-10-CM | POA: Diagnosis not present

## 2016-07-05 DIAGNOSIS — R55 Syncope and collapse: Secondary | ICD-10-CM

## 2016-07-05 LAB — URINE CULTURE

## 2016-07-05 LAB — BASIC METABOLIC PANEL
ANION GAP: 6 (ref 5–15)
BUN: 17 mg/dL (ref 6–20)
CHLORIDE: 104 mmol/L (ref 101–111)
CO2: 24 mmol/L (ref 22–32)
Calcium: 7.9 mg/dL — ABNORMAL LOW (ref 8.9–10.3)
Creatinine, Ser: 0.8 mg/dL (ref 0.61–1.24)
GFR calc Af Amer: >60 mL/min (ref >60)
GLUCOSE: 87 mg/dL (ref 65–99)
POTASSIUM: 4 mmol/L (ref 3.5–5.1)
SODIUM: 134 mmol/L — AB (ref 135–145)

## 2016-07-05 LAB — CBC
HCT: 31.1 % — ABNORMAL LOW (ref 39.0–52.0)
HEMOGLOBIN: 10.6 g/dL — AB (ref 13.0–17.0)
MCH: 29.3 pg (ref 26.0–34.0)
MCHC: 34.1 g/dL (ref 30.0–36.0)
MCV: 85.9 fL (ref 78.0–100.0)
PLATELETS: 205 10*3/uL (ref 150–400)
RBC: 3.62 MIL/uL — AB (ref 4.22–5.81)
RDW: 14.5 % (ref 11.5–15.5)
WBC: 6.2 10*3/uL (ref 4.0–10.5)

## 2016-07-05 LAB — HEMOGLOBIN A1C
Hgb A1c MFr Bld: 5.6 % (ref 4.8–5.6)
Mean Plasma Glucose: 114 mg/dL

## 2016-07-05 LAB — ECHOCARDIOGRAM COMPLETE
HEIGHTINCHES: 72 in
WEIGHTICAEL: 2303.37 [oz_av]

## 2016-07-05 LAB — TROPONIN I

## 2016-07-05 NOTE — Progress Notes (Signed)
CSW acknowledged consult that patient is from [place]. CSW contacted [facility] and confirmed that patient is from independent living. PT consulted, CSW will follow up if a higher level of care is needed.   Abundio Miu, [place] Social Worker [facility] Cell#: [phone]

## 2016-07-05 NOTE — Evaluation (Signed)
Physical Therapy Evaluation Patient Details Name: Brian Aguirre MRN: [id number] DOB: Oct 14, 1929 Today's Date: 07/05/2016   History of Present Illness  Pt admitted s/p fall  and with hx of Parkinsons, Memory loss and orthostatic hypotention  Clinical Impression  Pt admitted as above and presents with functional mobility limitations 2* generalized weakness and balance deficits limiting functional mobility.  Pt would benefit from follow up HHPT to maximize IND and safety.    Follow Up Recommendations Home health PT    Equipment Recommendations  None recommended by PT    Recommendations for Other Services       Precautions / Restrictions Precautions Precautions: Fall Restrictions Weight Bearing Restrictions: No      Mobility  Bed Mobility Overal bed mobility: Needs Assistance Bed Mobility: Supine to Sit     Supine to sit: Min assist     General bed mobility comments: Increased time, use of bedrail and min assist to bring trunk to upright and to complete transition to EOB  Transfers Overall transfer level: Needs assistance Equipment used: Rolling walker (2 wheeled) Transfers: Sit to/from Stand Sit to Stand: Min assist         General transfer comment: cues for transition position and use of UEs to self assist.  Physical assist to bring wt fwd and balance in initial standing  Ambulation/Gait Ambulation/Gait assistance: Min assist Ambulation Distance (Feet): 100 Feet Assistive device: Rolling walker (2 wheeled) Gait Pattern/deviations: Step-through pattern;Decreased step length - right;Decreased step length - left;Shuffle;Trunk flexed;Wide base of support Gait velocity: decr Gait velocity interpretation: Below normal speed for age/gender General Gait Details: cues for posture, position from RW and to increase stride length  Stairs            Wheelchair Mobility    Modified Rankin (Stroke Patients Only)       Balance Overall balance assessment: Needs  assistance Sitting-balance support: No upper extremity supported;Feet supported Sitting balance-Leahy Scale: Good     Standing balance support: Bilateral upper extremity supported Standing balance-Leahy Scale: Poor Standing balance comment: post drift with initial standing - home CNA reports this is same as prior to admit                             Pertinent Vitals/Pain Pain Assessment: Faces Faces Pain Scale: Hurts little more Pain Location: ribs Pain Descriptors / Indicators: Aching;Sore Pain Intervention(s): Limited activity within patient's tolerance;Monitored during session;Premedicated before session    [place] expects to be discharged to:: Private residence Living Arrangements: Spouse/significant other Available Help at Discharge: Family;Personal care attendant Type of Home: Apartment Home Access: Level entry     Home Layout: One level Home Equipment: Walker - 2 wheels;Bedside commode;Grab bars - toilet;Grab bars - tub/shower;Wheelchair - manual Additional Comments: Pt has assist of aides from 730am to 10pm.  Family states they can arrange 24/7 if needed    Prior Function Level of Independence: Needs assistance   Gait / Transfers Assistance Needed: uses RW, home CNA reports min assist required  ADL's / Homemaking Assistance Needed: Assist of hired Scientist, research (life sciences)        Extremity/Trunk Assessment   Upper Extremity Assessment Upper Extremity Assessment: Generalized weakness    Lower Extremity Assessment Lower Extremity Assessment: Generalized weakness       Communication   Communication: No difficulties  Cognition Arousal/Alertness: Awake/alert Behavior During Therapy: WFL for tasks assessed/performed;Flat affect Overall Cognitive  Status: Within Functional Limits for tasks assessed                                        General Comments      Exercises     Assessment/Plan    PT  Assessment Patient needs continued PT services  PT Problem List Decreased strength;Decreased activity tolerance;Decreased balance;Decreased mobility;Decreased knowledge of use of DME;Pain       PT Treatment Interventions DME instruction;Gait training;Functional mobility training;Therapeutic activities;Therapeutic exercise;Patient/family education    PT Goals (Current goals can be found in the Care Plan section)  Acute Rehab PT Goals Patient Stated Goal: Regain IND PT Goal Formulation: With patient Time For Goal Achievement: 07/15/16 Potential to Achieve Goals: Good    Frequency Min 3X/week   Barriers to discharge        Co-evaluation               AM-PAC PT "6 Clicks" Daily Activity  Outcome Measure Difficulty turning over in bed (including adjusting bedclothes, sheets and blankets)?: Total Difficulty moving from lying on back to sitting on the side of the bed? : Total Difficulty sitting down on and standing up from a chair with arms (e.g., wheelchair, bedside commode, etc,.)?: Total Help needed moving to and from a bed to chair (including a wheelchair)?: A Little Help needed walking in hospital room?: A Little Help needed climbing 3-5 steps with a railing? : A Lot 6 Click Score: 11    End of Session Equipment Utilized During Treatment: Gait belt Activity Tolerance: Patient tolerated treatment well Patient left: in chair;with call bell/phone within reach;with family/visitor present Nurse Communication: Mobility status PT Visit Diagnosis: Unsteadiness on feet (R26.81);History of falling (Z91.81);Difficulty in walking, not elsewhere classified (R26.2);Dizziness and giddiness (R42)    Time: 6295-2841 PT Time Calculation (min) (ACUTE ONLY): 32 min   Charges:   PT Evaluation $PT Eval Low Complexity: 1 Procedure PT Treatments $Gait Training: 8-22 mins   PT G Codes:        Pg [phone]   Suzi Hernan 07/05/2016, 2:25 PM

## 2016-07-05 NOTE — Care Management Note (Signed)
Case Management Note  Patient Details  Name: Brian Aguirre MRN: [id number] Date of Birth: April 15, 1929  Subjective/Objective:  PT recc HHPT. [facility] chosen for HHPT-rep [facility] aware & following for d/c. Patient to return back to [place].                  Action/Plan:d/c home w/HHC.   Expected Discharge Date:   (unknown)               Expected Discharge Plan:  [place]  In-House Referral:     Discharge planning Services  CM Consult  Post Acute Care Choice:  Durable Medical Equipment, Resumption of Svcs/PTA Provider (Has rw,w/c; private aide-custodial level-12hrs/day) Choice offered to:  Adult Children  DME Arranged:    DME Agency:     HH Arranged:  PT HH Agency:  [facility]  Status of Service:  Completed, signed off  If discussed at [place] of Stay Meetings, dates discussed:    Additional Comments:  Dessa Phi, RN 07/05/2016, 2:18 PM

## 2016-07-05 NOTE — Discharge Summary (Addendum)
Physician Discharge Summary  Brian Aguirre:[id number] DOB: [birth year] DOA: 07/04/2016  PCP: Lajean Manes, MD  Admit date: 07/04/2016 Discharge date: 07/05/2016  Admitted From:Independent living facility Disposition: Independent living facility    Recommendations for Outpatient Follow-up:  1. Follow up with PCP in 1-2 weeks 2. Please obtain BMP/CBC in one week 3. Please follow up with your neurologist in 1-2 weeks to discuss about Parkinson's medications   Home Health: Resume home care services Equipment/Devices: None Discharge Condition: Stable CODE STATUS: Full code Diet recommendation: Heart healthy  Brief/Interim Summary: 83 y.o. male with medical history significant of Parkinson disease, associate with autonomic dysfunction and orthostatic hypotension. Patient lives and friend [place] the independent living part, uses walker for ambulation, presented with dizziness and fall. Patient reported that he has dizziness and orthostatic hypotension for more than a year. Patient's daughter reported that the timing of morning medication for Parkinson disease was changed recently. In the ER, CT scan of the head showed no acute finding except  small subcutaneous hematoma after the fall. Vitals stable. Patient has no focal neurological deficit. Troponin negative 3. He was found to have significant orthostatic hypotension. This is not new. Echocardiogram was obtained with normal systolic function. EKG with old left bundle branch block, no acute change. I have discussed with the patient and his daughter at bedside at length. I recommended to use walker and supportive with compression stocking and lower extremities. I recommended to follow-up with neurologist to discuss about Sinemet medications. This morning patient was sitting on chair. He denied headache, dizziness, chest pain, shortness of breath. I believe patient needs close observation, rehabilitation and follow-up with his primary care doctor  and neurologist. I believe patient does not need further inpatient workup. Resume home medications.  He has elevated blood pressure while sitting and drops understanding. Patient and his daughter reported that this is chronic ongoing problem and he follows up outpatient.  Patient and family verbalized understanding. I also discussed with the care management team nurses and pharmacist.   Discharge Diagnoses:  Principal Problem:   Syncope Active Problems:   Orthostatic hypotension   Dizziness and giddiness   Chronic constipation   Contusion of forehead   Parkinson disease ([place])   Autonomic dysfunction    Discharge Instructions  Discharge Instructions    Call MD for:  difficulty breathing, headache or visual disturbances    Complete by:  As directed    Call MD for:  extreme fatigue    Complete by:  As directed    Call MD for:  hives    Complete by:  As directed    Call MD for:  persistant dizziness or light-headedness    Complete by:  As directed    Call MD for:  persistant nausea and vomiting    Complete by:  As directed    Call MD for:  severe uncontrolled pain    Complete by:  As directed    Call MD for:  temperature >100.4    Complete by:  As directed    Diet - low sodium heart healthy    Complete by:  As directed    Discharge instructions    Complete by:  As directed    Please use lower extremities compression stockings. Please follow up with her neurologist to review the medications and the timing doses.   Increase activity slowly    Complete by:  As directed      Allergies as of 07/05/2016      Reactions  Myrbetriq [mirabegron] Nausea And Vomiting   Stalevo [carbidopa-levodopa-entacapone] Diarrhea   GI upset      Medication List    STOP taking these medications   cloNIDine 0.1 MG tablet Commonly known as:  CATAPRES     TAKE these medications   ALIGN PO Take 1 capsule by mouth daily.   AMITIZA 24 MCG capsule Generic drug:  lubiprostone TAKE 1  CAPSULE TWICE DAILY WITH MEALS.   aspirin EC 81 MG tablet Take 81 mg by mouth at bedtime.   carbidopa-levodopa 25-250 MG tablet Commonly known as:  SINEMET IR TAKE  (1)  TABLET  FOUR TIMES DAILY. What changed:  See the new instructions.   RYTARY 48.75-195 MG Cpcr Generic drug:  Carbidopa-Levodopa ER TAKE 1 CAPSULE DAILY AFTER SUPPER. What changed:  See the new instructions.   carbidopa-levodopa 25-100 MG tablet Commonly known as:  SINEMET IR TAKE 1 TABLET 3 TIMES A DAY. What changed:  See the new instructions.   docusate sodium 100 MG capsule Commonly known as:  COLACE Take 200 mg by mouth at bedtime.   fish oil-omega-3 fatty acids 1000 MG capsule Take 1 g by mouth daily with breakfast.   multivitamin with minerals Tabs tablet Take 1 tablet by mouth every morning.   pramipexole 1 MG tablet Commonly known as:  MIRAPEX TAKE 1 TABLET 3 TIMES A DAY.   ranitidine 75 MG tablet Commonly known as:  ZANTAC Take 75 mg by mouth daily as needed for heartburn (reflux).   selegiline 5 MG capsule Commonly known as:  ELDEPRYL TAKE 1 CAPSULE TWICE DAILY WITH BREAKFAST AND LUNCH.   triamcinolone 55 MCG/ACT Aero nasal inhaler Commonly known as:  NASACORT Place 2 sprays into the nose as needed (allergies).   vitamin B-12 500 MCG tablet Commonly known as:  CYANOCOBALAMIN Take 500 mcg by mouth every morning.   vitamin D (CHOLECALCIFEROL) 400 units tablet Take 400 Units by mouth every morning.      Follow-up Information    [facility] Follow up.   Specialty:  [place] Why:  [facility] physical therapy Contact information: [place] [address] [phone]        Lajean Manes, MD. Schedule an appointment as soon as possible for a visit in 1 week(s).   Specialty:  Internal Medicine Contact information: [address] [place] [address] [phone]          Allergies  Allergen Reactions  . Myrbetriq  [Mirabegron] Nausea And Vomiting  . Stalevo [Carbidopa-Levodopa-Entacapone] Diarrhea    GI upset    Consultations: None  Procedures/Studies: Echocardiogram  Subjective: Seen and examined at bedside. He denied headache, dizziness, chest pain, shortness of breath, nausea or vomiting.  Discharge Exam: Vitals:   07/05/16 0610 07/05/16 1327  BP: (!) 189/73 (!) 188/72  Pulse: 61 (!) 58  Resp: 16 16  Temp: 98.1 F (36.7 C) 97.8 F (36.6 C)   Vitals:   07/04/16 1402 07/04/16 2122 07/05/16 0610 07/05/16 1327  BP: (!) 186/69 140/74 (!) 189/73 (!) 188/72  Pulse: 60 (!) 56 61 (!) 58  Resp: 16 16 16 16   Temp: 98 F (36.7 C) 97.9 F (36.6 C) 98.1 F (36.7 C) 97.8 F (36.6 C)  TempSrc: Oral Axillary Oral Oral  SpO2: 98% 96% 96% 97%  Weight: 65.3 kg (143 lb 15.4 oz)     Height: 6' (1.829 m)       General: Pt is alert, awake, not in  acute distress Cardiovascular: RRR, S1/S2 +, no rubs, no gallops Respiratory: CTA bilaterally, no wheezing, no rhonchi Abdominal: Soft, NT, ND, bowel sounds + Extremities: no edema, no cyanosis    The results of significant diagnostics from this hospitalization (including imaging, microbiology, ancillary and laboratory) are listed below for reference.     Microbiology: Recent Results (from the past 240 hour(s))  Urine culture     Status: Abnormal   Collection Time: 07/04/16 11:29 AM  Result Value Ref Range Status   Specimen Description URINE, CLEAN CATCH  Final   Special Requests NONE  Final   Culture (A)  Final    <10,000 COLONIES/mL INSIGNIFICANT GROWTH Performed at [facility], [address], [place], [place] [address]    Report Status 07/05/2016 FINAL  Final     Labs: BNP (last 3 results) No results for input(s): BNP in the last 8760 hours. Basic Metabolic Panel:  Recent Labs Lab 07/04/16 1038 07/05/16 0237  NA 133* 134*  K 4.0 4.0  CL 99* 104  CO2 27 24  GLUCOSE 119* 87  BUN 19 17  CREATININE 0.89 0.80  CALCIUM  8.5* 7.9*   Liver Function Tests: No results for input(s): AST, ALT, ALKPHOS, BILITOT, PROT, ALBUMIN in the last 168 hours. No results for input(s): LIPASE, AMYLASE in the last 168 hours. No results for input(s): AMMONIA in the last 168 hours. CBC:  Recent Labs Lab 07/04/16 1038 07/05/16 0237  WBC 7.1 6.2  NEUTROABS 5.6  --   HGB 11.1* 10.6*  HCT 33.1* 31.1*  MCV 86.9 85.9  PLT 230 205   Cardiac Enzymes:  Recent Labs Lab 07/04/16 1038 07/04/16 1441 07/04/16 1951 07/05/16 0237  TROPONINI <0.03 <0.03 <0.03 <0.03   BNP: Invalid input(s): POCBNP CBG: No results for input(s): GLUCAP in the last 168 hours. D-Dimer No results for input(s): DDIMER in the last 72 hours. Hgb A1c  Recent Labs  07/04/16 1441  HGBA1C 5.6   Lipid Profile No results for input(s): CHOL, HDL, LDLCALC, TRIG, CHOLHDL, LDLDIRECT in the last 72 hours. Thyroid function studies  Recent Labs  07/04/16 1441  TSH 1.225   Anemia work up No results for input(s): VITAMINB12, FOLATE, FERRITIN, TIBC, IRON, RETICCTPCT in the last 72 hours. Urinalysis    Component Value Date/Time   COLORURINE YELLOW 07/04/2016 1129   APPEARANCEUR CLEAR 07/04/2016 1129   LABSPEC 1.014 07/04/2016 1129   PHURINE 7.0 07/04/2016 1129   GLUCOSEU NEGATIVE 07/04/2016 1129   HGBUR NEGATIVE 07/04/2016 1129   [place] 07/04/2016 1129   KETONESUR 5 (A) 07/04/2016 1129   PROTEINUR NEGATIVE 07/04/2016 1129   UROBILINOGEN 0.2 07/06/2014 1915   NITRITE NEGATIVE 07/04/2016 1129   LEUKOCYTESUR NEGATIVE 07/04/2016 1129   Sepsis Labs Invalid input(s): PROCALCITONIN,  WBC,  LACTICIDVEN Microbiology Recent Results (from the past 240 hour(s))  Urine culture     Status: Abnormal   Collection Time: 07/04/16 11:29 AM  Result Value Ref Range Status   Specimen Description URINE, CLEAN CATCH  Final   Special Requests NONE  Final   Culture (A)  Final    <10,000 COLONIES/mL INSIGNIFICANT GROWTH Performed at [facility], [address] [place], [place] [address]    Report Status 07/05/2016 FINAL  Final     Time coordinating discharge: 35 minutes  SIGNED:   Rosita Fire, MD  [facility] 07/05/2016, 2:35 PM  If 7PM-7AM, please contact night-coverage [email or web address] Password [email or web address]

## 2016-07-05 NOTE — Care Management Note (Signed)
Case Management Note  Patient Details  Name: HASAAN RADDE MRN: [id number] Date of Birth: 02/08/1929  Subjective/Objective:  83 y/o m admitted w/syncope. From [facility]-indep liv. Has private aide;also uses [facility]. Has daughter support-they really prfer to return back to [facility] w/24hr aide,& HHC-uses [facility] already following.Await PT cons, & recc.  CSW also following.                Action/Plan:d/c plan Indep liv-[facility] w/HHC.   Expected Discharge Date:   (unknown)               Expected Discharge Plan:  [place]  In-House Referral:     Discharge planning Services  CM Consult  Post Acute Care Choice:  Durable Medical Equipment, Resumption of Svcs/PTA Provider (Has rw,w/c; private aide-custodial level-12hrs/day) Choice offered to:  Adult Children  DME Arranged:    DME Agency:     HH Arranged:    HH Agency:     Status of Service:  In process, will continue to follow  If discussed at Long Length of Stay Meetings, dates discussed:    Additional Comments:  Dessa Phi, RN 07/05/2016, 12:54 PM

## 2016-07-05 NOTE — Progress Notes (Signed)
  Echocardiogram 2D Echocardiogram has been performed.  Brian Aguirre 07/05/2016, 1:28 PM

## 2016-07-05 NOTE — Care Management Note (Signed)
Case Management Note  Patient Details  Name: ZAYVON ALICEA MRN: [id number] Date of Birth: 1929-07-07  Subjective/Objective:  Added HHRN-[facility] rep [place] aware-meds,disease mgmnt.                  Action/Plan:d/c home w/HHC.   Expected Discharge Date:  07/05/16               Expected Discharge Plan:  [place]  In-House Referral:     Discharge planning Services  CM Consult  Post Acute Care Choice:  Durable Medical Equipment, Resumption of Svcs/PTA Provider (Has rw,w/c; private aide-custodial level-12hrs/day) Choice offered to:  Adult Children  DME Arranged:    DME Agency:     HH Arranged:  PT, RN [place] Agency:  [place]  Status of Service:  Completed, signed off  If discussed at [place] of Stay Meetings, dates discussed:    Additional Comments:  Dessa Phi, RN 07/05/2016, 3:12 PM

## 2016-07-05 NOTE — Evaluation (Signed)
Clinical/Bedside Swallow Evaluation Patient Details  Name: Brian Aguirre MRN: [id number] Date of Birth: [birth year]  Today's Date: 07/05/2016 Time: SLP Start Time (ACUTE ONLY): 1150 SLP Stop Time (ACUTE ONLY): 1225 SLP Time Calculation (min) (ACUTE ONLY): 35 min  Past Medical History:  Past Medical History:  Diagnosis Date  . Benign enlargement of prostate   . Chronic constipation 02/26/2014  . Contusion of forehead 03/09/2014  . Depression   . Dizziness   . Fracture, rib   . Gait disturbance   . History of fall 03/09/2014  . Hypertension   . Memory difficulties 07/20/2014  . Memory loss   . Orthostatic hypotension   . Osteoporosis   . Parkinson disease [facility])    Past Surgical History:  Past Surgical History:  Procedure Laterality Date  . APPENDECTOMY    . CATARACT EXTRACTION Left   . HERNIA REPAIR    . TONSILLECTOMY     HPI:  83 yo male adm to [facility] with syncope, pt found to have right SDH in frontoparietal region per imaging study.  Pt has h/o Parkinson's disease, C5-C6, C6-C7 spondylosis.  Swallow eval was ordered.  Pt admits to some premorbid difficulties with pills sticking in mouth.  He has seen SLP previously for speech/language skills at home.    Assessment / Plan / Recommendation Clinical Impression  Pt presents with signs of suspect low grade chronic dysphagia due to his Parkinson's disease.  He has a masked facies and left facial asymmetry = note pt also has SDH.  Observed him consuming lunch including tough pork loin, salad, sweet potato, applesauce and lemonade.  Pt with mildly slow mastication - and reports oral pocketing on left.   Subtle cough x1 noted with initial swallow of lemonade - daughter reports pt has chronic problems with nasal drainage.  Difficulty with swallowing a tablet noted = use of applesauce effective.  Pt has not had weight loss nor pulmonary infections per family.  Educated family and pt extensively to findings/recommendations.  SLP Visit  Diagnosis: Dysphagia, oropharyngeal phase (R13.12)    Aspiration Risk  Mild aspiration risk    Diet Recommendation Regular;Thin liquid   Liquid Administration via: Cup;Straw Medication Administration: Whole meds with liquid (with liquids, if difficult with puree) Supervision: Patient able to self feed Compensations: Slow rate;Small sips/bites;Lingual sweep for clearance of pocketing    Other  Recommendations Oral Care Recommendations: Oral care BID   Follow up Recommendations None      Frequency and Duration            Prognosis        Swallow Study   General Date of Onset: 07/05/16 HPI: 83 yo male adm to [facility] with syncope, pt found to have right SDH in frontoparietal region per imaging study.  Pt has h/o Parkinson's disease, C5-C6, C6-C7 spondylosis.  Swallow eval was ordered.  Pt admits to some premorbid difficulties with pills sticking in mouth.  He has seen SLP previously for speech/language skills at home.  Type of Study: Bedside Swallow Evaluation Diet Prior to this Study: Regular;Thin liquids Temperature Spikes Noted: No Respiratory Status: Room air History of Recent Intubation: No Behavior/Cognition: Alert;Cooperative;Pleasant mood;Other (Comment) (hypophonic, masked facies) Oral Cavity Assessment: Within Functional Limits Oral Care Completed by SLP: No Oral Cavity - Dentition: Adequate natural dentition Vision: Functional for self-feeding Self-Feeding Abilities: Able to feed self;Needs set up Patient Positioning: Upright in bed Baseline Vocal Quality: Low vocal intensity Volitional Cough: Weak (? due to pain from fall, suspect baseline some  weakness) Volitional Swallow: Able to elicit    Oral/Motor/Sensory Function Overall Oral Motor/Sensory Function: Moderate impairment Facial ROM: Reduced left Facial Symmetry: Abnormal symmetry left Facial Strength: Within Functional Limits Facial Sensation: Reduced left Lingual Symmetry: Within Functional Limits Lingual  Strength: Reduced Lingual Sensation: Within Functional Limits Velum: Within Functional Limits Mandible: Within Functional Limits   Ice Chips Ice chips: Not tested   Thin Liquid Thin Liquid: Impaired Presentation: Straw Pharyngeal  Phase Impairments: Cough - Delayed;Multiple swallows    Nectar Thick Nectar Thick Liquid: Not tested   Honey Thick Honey Thick Liquid: Not tested   Puree Puree: Within functional limits Presentation: Self Fed;Spoon   Solid   GO   Solid: Impaired Presentation: Self Fed Oral Phase Impairments: Impaired mastication Oral Phase Functional Implications: Impaired mastication Other Comments: slow mastication    Functional Assessment Tool Used: NOMS, clinical judgement Functional Limitations: Swallowing Swallow Current Status (O4599): At least 1 percent but less than 20 percent impaired, limited or restricted Swallow Goal Status [phone]): At least 1 percent but less than 20 percent impaired, limited or restricted Swallow Discharge Status [phone]): At least 1 percent but less than 20 percent impaired, limited or restricted   Luanna Salk, [facility] SLP [phone]

## 2016-07-05 NOTE — Care Management Obs Status (Signed)
[place] NOTIFICATION   Patient Details  Name: Brian Aguirre MRN: [id number] Date of Birth: Sep 10, 1929   Medicare Observation Status Notification Given:  Yes    MahabirJuliann Pulse, RN 07/05/2016, 12:56 PM

## 2016-07-06 ENCOUNTER — Telehealth: Payer: Self-pay | Admitting: Neurology

## 2016-07-06 DIAGNOSIS — S0083XD Contusion of other part of head, subsequent encounter: Secondary | ICD-10-CM | POA: Diagnosis not present

## 2016-07-06 DIAGNOSIS — S0990XD Unspecified injury of head, subsequent encounter: Secondary | ICD-10-CM | POA: Diagnosis not present

## 2016-07-06 DIAGNOSIS — G20 Parkinson's disease: Secondary | ICD-10-CM | POA: Diagnosis not present

## 2016-07-06 DIAGNOSIS — R42 Dizziness and giddiness: Secondary | ICD-10-CM | POA: Diagnosis not present

## 2016-07-06 DIAGNOSIS — I951 Orthostatic hypotension: Secondary | ICD-10-CM | POA: Diagnosis not present

## 2016-07-06 DIAGNOSIS — Z7982 Long term (current) use of aspirin: Secondary | ICD-10-CM | POA: Diagnosis not present

## 2016-07-06 DIAGNOSIS — F458 Other somatoform disorders: Secondary | ICD-10-CM | POA: Diagnosis not present

## 2016-07-06 DIAGNOSIS — R55 Syncope and collapse: Secondary | ICD-10-CM | POA: Diagnosis not present

## 2016-07-06 DIAGNOSIS — R9431 Abnormal electrocardiogram [ECG] [EKG]: Secondary | ICD-10-CM | POA: Diagnosis not present

## 2016-07-06 DIAGNOSIS — D649 Anemia, unspecified: Secondary | ICD-10-CM | POA: Diagnosis not present

## 2016-07-06 DIAGNOSIS — K219 Gastro-esophageal reflux disease without esophagitis: Secondary | ICD-10-CM | POA: Diagnosis not present

## 2016-07-06 DIAGNOSIS — E871 Hypo-osmolality and hyponatremia: Secondary | ICD-10-CM | POA: Diagnosis not present

## 2016-07-06 NOTE — Telephone Encounter (Signed)
I have spoken with Brian Aguirre. Pt. has a hx. of orthostatic hypotension, well ldocumented by [name].  He fell on 07/04/16, admitted to the hospital, where CT  head did not show any brain injury, no broken bones. Pt. was d/c yestereday and was very alert.  Last night and this morning, as been more confused.  He is typically more confused in the mornings, but both his dtr. and home health aide feel he is more confused, slower to react this morning than he usually is. No noted fever or other signs of infection. 1--will check with w/i doc to see if further imaging to r/o late appearing head injury is needed. 2--will check with w/i doc to see if labs to r/o early infection are needed--pt. could see pcp for this. 3--per dtr's request, will ask w/i doc if she feels any of the meds rx'd by this office are contributing to orthostatic hypotension, although [name] is well aware of this, just saw pt. in May, and did not change meds. I will call Brian Aguirre back once w/i doc has had time to review chart/fim

## 2016-07-06 NOTE — Telephone Encounter (Signed)
Patients daughter Eustaquio Maize called office in reference to patient falling Tuesday morning transported to [facility] and discharged yesterday.  Hospitialist and hospital pharmacist advised daughters patients medications could be contributing to hypertension and dizziness.  Beth advised to contact [name] office to discuss the medications.  Per Beth patient was discharged she was not instructed to help with patients pain what patient can take other than tylenol.  Please call

## 2016-07-06 NOTE — Telephone Encounter (Signed)
After speaking with Dr. Brett Fairy, I have spoken with [facility] again.  1--as CT head was ok, pt's confusion is per norm worse in the am, and there are no other neuro deficits (i.e. change in LOC), she would prefer pt. be seen by pcp for r/o metabolic imbalance or early infection that may be affecting cognition.  [name] can review pt's meds next week and rec. any changes.  Appt. with [name] given 07/10/16 at noon.  Dtr. Eustaquio Maize is agreeable with this plan and will call our office back if needed/fim

## 2016-07-06 NOTE — Telephone Encounter (Signed)
  I have discussed Brian Aguirre regimen with nurse Faith Misenheimer and agree with the changes as she has documented. Common Brian Aguirre

## 2016-07-07 ENCOUNTER — Ambulatory Visit
Admission: RE | Admit: 2016-07-07 | Discharge: 2016-07-07 | Disposition: A | Discharge status: Home / Self Care | Payer: Medicare Other | Source: Ambulatory Visit | Attending: Geriatric Medicine | Admitting: Geriatric Medicine

## 2016-07-07 ENCOUNTER — Other Ambulatory Visit: Payer: Self-pay | Admitting: Geriatric Medicine

## 2016-07-07 DIAGNOSIS — R0789 Other chest pain: Secondary | ICD-10-CM

## 2016-07-07 DIAGNOSIS — G20 Parkinson's disease: Secondary | ICD-10-CM | POA: Diagnosis not present

## 2016-07-07 DIAGNOSIS — S2241XA Multiple fractures of ribs, right side, initial encounter for closed fracture: Secondary | ICD-10-CM | POA: Diagnosis not present

## 2016-07-07 DIAGNOSIS — I10 Essential (primary) hypertension: Secondary | ICD-10-CM | POA: Diagnosis not present

## 2016-07-07 DIAGNOSIS — N390 Urinary tract infection, site not specified: Secondary | ICD-10-CM | POA: Diagnosis not present

## 2016-07-07 DIAGNOSIS — E222 Syndrome of inappropriate secretion of antidiuretic hormone: Secondary | ICD-10-CM | POA: Diagnosis not present

## 2016-07-07 NOTE — Telephone Encounter (Signed)
Brian Aguirre is a PD patient of Dr Tobey Grim with orthostatic hypotension,  Possibly mediated by dopaminergic medication. Patient was asked ot hydrate , CT looked normal. May need compression stockings or other conservative ways to help avoiding hypotension.  I have discussed the plan and assessment with RN and agree with Faith note. CD

## 2016-07-08 NOTE — Progress Notes (Signed)
   07/05/16 1400  PT Time Calculation  PT Start Time (ACUTE ONLY) 1320  PT Stop Time (ACUTE ONLY) 1352  PT Time Calculation (min) (ACUTE ONLY) 32 min  PT G-Codes **NOT FOR INPATIENT CLASS**  Functional Assessment Tool Used Clinical judgement  Functional Limitation Mobility: Walking and moving around  Mobility: Walking and Moving Around Current Status (U9811) CK  Mobility: Walking and Moving Around Goal Status (B1478) CJ  PT General Charges  $$ ACUTE PT VISIT 1 Procedure  PT Evaluation  $PT Eval Low Complexity 1 Procedure  PT Treatments  $Gait Training 8-22 mins

## 2016-07-10 ENCOUNTER — Ambulatory Visit (INDEPENDENT_AMBULATORY_CARE_PROVIDER_SITE_OTHER): Payer: Medicare Other | Admitting: Neurology

## 2016-07-10 ENCOUNTER — Encounter: Payer: Self-pay | Admitting: Neurology

## 2016-07-10 VITALS — BP 143/65 | HR 64

## 2016-07-10 DIAGNOSIS — G20 Parkinson's disease: Secondary | ICD-10-CM

## 2016-07-10 DIAGNOSIS — R269 Unspecified abnormalities of gait and mobility: Secondary | ICD-10-CM | POA: Diagnosis not present

## 2016-07-10 DIAGNOSIS — R42 Dizziness and giddiness: Secondary | ICD-10-CM

## 2016-07-10 DIAGNOSIS — I951 Orthostatic hypotension: Secondary | ICD-10-CM

## 2016-07-10 MED ORDER — PRAMIPEXOLE DIHYDROCHLORIDE 0.75 MG PO TABS: 0.7500 mg | ORAL_TABLET | Freq: Three times a day (TID) | ORAL | 4 refills | Status: DC

## 2016-07-10 MED ORDER — TRAMADOL HCL 50 MG PO TABS: 50.0000 mg | ORAL_TABLET | Freq: Four times a day (QID) | ORAL | 1 refills | Status: DC | PRN

## 2016-07-10 NOTE — Patient Instructions (Signed)
   We will reduce the mirapex to 0.75 mg three times a day.  You may take ultram 50 mg every 6 hours if needed.

## 2016-07-10 NOTE — Progress Notes (Signed)
Reason for visit: Parkinson's disease  Brian Aguirre is an 83 y.o. male  History of present illness:  [name] is an 83 year old right-handed white male with a history of Parkinson's disease associated with a significant gait disorder and orthostatic hypotension. The patient was admitted to the hospital on 07/04/2016 following a fall. The patient was making a turn in the bathroom, he fell to the side and struck the right side of his head and fractured ribs at the T9 and 10 level on the right. The patient has had some discomfort in the right chest following a fall. He has had some gradual increasing confusion and memory problems over the last several months. He will hallucinate on occasion. The patient has had some slight increased confusion since the fall. In the hospital a CT scan of the brain was done and showed extensive white matter changes, but no acute changes were seen. The patient may have some dizziness with standing, he will have intermittent episodes of low blood pressure, at other times his blood pressure goes extremely high. He comes back into the office today for an evaluation. He remains on selegiline and Mirapex as well as Sinemet.  Past Medical History:  Diagnosis Date  . Benign enlargement of prostate   . Chronic constipation 02/26/2014  . Contusion of forehead 03/09/2014  . Depression   . Dizziness   . Fracture, rib   . Gait disturbance   . History of fall 03/09/2014  . Hypertension   . Memory difficulties 07/20/2014  . Memory loss   . Orthostatic hypotension   . Osteoporosis   . Parkinson disease [facility])     Past Surgical History:  Procedure Laterality Date  . APPENDECTOMY    . CATARACT EXTRACTION Left   . HERNIA REPAIR    . TONSILLECTOMY      Family History  Problem Relation Age of Onset  . Cancer Mother   . Heart failure Father   . Breast cancer Daughter     Social history:  reports that he has never smoked. He has never used smokeless tobacco. He  reports that he does not drink alcohol or use drugs.    Allergies  Allergen Reactions  . Myrbetriq [Mirabegron] Nausea And Vomiting  . Stalevo [Carbidopa-Levodopa-Entacapone] Diarrhea    GI upset    Medications:  Prior to Admission medications   Medication Sig Start Date End Date Taking? Authorizing Provider  AMITIZA 24 MCG capsule TAKE 1 CAPSULE TWICE DAILY WITH MEALS. 05/23/16  Yes Kathrynn Ducking, MD  aspirin EC 81 MG tablet Take 81 mg by mouth at bedtime.   Yes [provider]  carbidopa-levodopa (SINEMET IR) 25-100 MG tablet TAKE 1 TABLET 3 TIMES A DAY. Patient taking differently: Take 0.5-1 tablets 3 times daily; 1 tab at 10a, 0.5 tab at 3p, 0.5 tab at 7p 06/22/16  Yes Kathrynn Ducking, MD  carbidopa-levodopa (SINEMET IR) 25-250 MG tablet TAKE  (1)  TABLET  FOUR TIMES DAILY. Patient taking differently: Take 1 tablet 3 times daily: takes at 7:30a, 12:30p, 4:30p 12/01/15  Yes Kathrynn Ducking, MD  docusate sodium (COLACE) 100 MG capsule Take 200 mg by mouth at bedtime.    Yes [provider]  fish oil-omega-3 fatty acids 1000 MG capsule Take 1 g by mouth daily with breakfast.    Yes [provider]  Multiple Vitamin (MULTIVITAMIN WITH MINERALS) TABS Take 1 tablet by mouth every morning.    Yes [provider]  pramipexole (  MIRAPEX) 1 MG tablet TAKE 1 TABLET 3 TIMES A DAY. 06/02/16  Yes Kathrynn Ducking, MD  Probiotic Product (ALIGN PO) Take 1 capsule by mouth daily.   Yes [provider]  ranitidine (ZANTAC) 75 MG tablet Take 75 mg by mouth daily as needed for heartburn (reflux).    Yes [provider]  RYTARY 48.75-195 MG CPCR TAKE 1 CAPSULE DAILY AFTER SUPPER. Patient taking differently: Take 1 capsule daily at bedtime (9p) 05/16/16  Yes Kathrynn Ducking, MD  selegiline (ELDEPRYL) 5 MG capsule TAKE 1 CAPSULE TWICE DAILY WITH BREAKFAST AND LUNCH. 09/30/15  Yes Kathrynn Ducking, MD  triamcinolone (NASACORT) 55 MCG/ACT AERO nasal  inhaler Place 2 sprays into the nose as needed (allergies).   Yes [provider]  vitamin B-12 (CYANOCOBALAMIN) 500 MCG tablet Take 500 mcg by mouth every morning.    Yes [provider]  vitamin D, CHOLECALCIFEROL, 400 UNITS tablet Take 400 Units by mouth every morning.   Yes [provider]    ROS:  Out of a complete 14 system review of symptoms, the patient complains only of the following symptoms, and all other reviewed systems are negative.  Decreased appetite Double vision Cough Dizziness Speech difficulty, weakness Agitation, confusion  Blood pressure (!) 143/65, pulse 64.  Physical Exam  General: The patient is alert and cooperative at the time of the examination.  Respiratory: Occasional rhonchi are noted bilaterally.  Cardiovascular: Regular rate rhythm, no murmurs or rubs are noted.  Skin: No significant peripheral edema is noted.   Neurologic Exam  Mental status: The patient is alert and oriented x 3 at the time of the examination. The patient has apparent normal recent and remote memory, with an apparently normal attention span and concentration ability.   Cranial nerves: Facial symmetry is present. Speech is hypophonic, slightly dysarthric. Extraocular movements are full. Visual fields are full.  Motor: The patient has good strength in all 4 extremities.  Sensory examination: Soft touch sensation is symmetric on the face, arms, and legs.  Coordination: The patient has good finger-nose-finger and heel-to-shin bilaterally.  Gait and station: The patient is able to stand with assistance, but he has a tendency to lean backwards, he cannot effectively ambulate.  Reflexes: Deep tendon reflexes are symmetric.   Assessment/Plan:  1. Parkinson's disease  2. Gait disorder, recent fall  3. Memory disturbance  4. Orthostatic hypotension  The patient will be reduced on the Mirapex slightly going to 0.75 mg 3 times daily. The  Sinemet dosing will be kept as is. The blood pressures will continue to be monitored. The patient has a significant gait disorder, if he ambulates he will require assistance at all times. He will follow-up in about 3 months.  Jill Alexanders MD 07/10/2016 12:48 PM  [facility] [address] [place], [place] [address]  Phone [phone] Fax [phone]

## 2016-07-11 ENCOUNTER — Telehealth: Payer: Self-pay | Admitting: Neurology

## 2016-07-11 DIAGNOSIS — R42 Dizziness and giddiness: Secondary | ICD-10-CM | POA: Diagnosis not present

## 2016-07-11 DIAGNOSIS — R55 Syncope and collapse: Secondary | ICD-10-CM | POA: Diagnosis not present

## 2016-07-11 NOTE — Telephone Encounter (Signed)
Okay to use selegiline and Ultram, the contraindication is present with all MAO B inhibitors, but in reality, the risk is low, the concern is with the serotonin syndrome.

## 2016-07-11 NOTE — Telephone Encounter (Signed)
[place] called to advise of contraindication between tramadol and selegiline. She has faxed the information, I will give to Michelle/RN.

## 2016-07-13 DIAGNOSIS — N390 Urinary tract infection, site not specified: Secondary | ICD-10-CM | POA: Diagnosis not present

## 2016-07-13 DIAGNOSIS — R55 Syncope and collapse: Secondary | ICD-10-CM | POA: Diagnosis not present

## 2016-07-13 DIAGNOSIS — R42 Dizziness and giddiness: Secondary | ICD-10-CM | POA: Diagnosis not present

## 2016-07-15 ENCOUNTER — Telehealth: Payer: Self-pay | Admitting: Neurology

## 2016-07-15 DIAGNOSIS — S060X0D Concussion without loss of consciousness, subsequent encounter: Secondary | ICD-10-CM

## 2016-07-15 DIAGNOSIS — R410 Disorientation, unspecified: Secondary | ICD-10-CM

## 2016-07-15 NOTE — Telephone Encounter (Signed)
He fell 07/04/16 hitting the right side of his head.   CT that day does not show any bleeds or fractures but shows right frontal knot subcutaneously.   He has atrophy and small vessel ischemic changes  His daughter called that Pradeep has had more hallucinations this weekend including having conversations with and seeing someone who is not there.  He was not agitated.   Since the fall, gait has been worse.  Advised that if he worsens further today that they should take to ED and get him re-imaged if appropriate.  I let the daughter know that I would forward a note to you and/or your nurse.

## 2016-07-16 ENCOUNTER — Telehealth: Payer: Self-pay | Admitting: Neurology

## 2016-07-16 MED ORDER — HYDROXYZINE HCL 10 MG PO TABS: ORAL_TABLET | ORAL | 1 refills | Status: DC

## 2016-07-16 NOTE — Telephone Encounter (Signed)
I spoke to his daughter Ronnie Derby   [phone]) again about [place].  He was agitated overnight and had more hallucinations.  He slept poorly.   Currently (930 am Sunday), he is better, closer to baseline and not having any physical issues or agitation.  He had a UA on Friday according to the daughter that was fine  I suspect the hallucinations may be associated with his poor sleep possibly due to discomfort from rib fracture.    I will call in hydroxyzine 10 mg to take one every night and 1 -2 times as needed for agitation   Sent in to [facility]

## 2016-07-17 NOTE — Telephone Encounter (Signed)
Patients daughter called office in reference to speaking with Dr. Felecia Shelling over the weekend about having imaging needing to be done today.  Daughter would like to have scheduled ASAP and would like to speak with [name].  Please call.

## 2016-07-17 NOTE — Telephone Encounter (Signed)
Patients daughter called again requesting a call back ASAP!

## 2016-07-17 NOTE — Telephone Encounter (Signed)
I called and talked with the daughter. The patient has had what sounds like a delirium state following a fall with head trauma. The patient had not been sleeping over the last several days, he was given hydroxyzine which helped him sleep last night, he is better today, he is still confused and hallucinating.  Given the ongoing confusion, I will repeat the CT of the brain, the initial study did not show evidence of intracranial bleeding, but a late onset subdural hematoma does need to be excluded.

## 2016-07-17 NOTE — Addendum Note (Signed)
Addended by: Kathrynn Ducking on: 07/17/2016 01:39 PM   Modules accepted: Orders

## 2016-07-18 ENCOUNTER — Ambulatory Visit
Admission: RE | Admit: 2016-07-18 | Discharge: 2016-07-18 | Disposition: A | Discharge status: Home / Self Care | Payer: Medicare Other | Source: Ambulatory Visit | Attending: Neurology | Admitting: Neurology

## 2016-07-18 DIAGNOSIS — S0990XA Unspecified injury of head, initial encounter: Secondary | ICD-10-CM | POA: Diagnosis not present

## 2016-07-18 DIAGNOSIS — R269 Unspecified abnormalities of gait and mobility: Secondary | ICD-10-CM | POA: Diagnosis not present

## 2016-07-18 DIAGNOSIS — S060X0D Concussion without loss of consciousness, subsequent encounter: Secondary | ICD-10-CM

## 2016-07-18 DIAGNOSIS — F99 Mental disorder, not otherwise specified: Secondary | ICD-10-CM | POA: Diagnosis not present

## 2016-07-18 DIAGNOSIS — R42 Dizziness and giddiness: Secondary | ICD-10-CM | POA: Diagnosis not present

## 2016-07-18 DIAGNOSIS — R410 Disorientation, unspecified: Secondary | ICD-10-CM

## 2016-07-19 DIAGNOSIS — R42 Dizziness and giddiness: Secondary | ICD-10-CM | POA: Diagnosis not present

## 2016-07-19 DIAGNOSIS — R55 Syncope and collapse: Secondary | ICD-10-CM | POA: Diagnosis not present

## 2016-07-21 ENCOUNTER — Telehealth: Payer: Self-pay | Admitting: Neurology

## 2016-07-21 DIAGNOSIS — R42 Dizziness and giddiness: Secondary | ICD-10-CM | POA: Diagnosis not present

## 2016-07-21 DIAGNOSIS — R55 Syncope and collapse: Secondary | ICD-10-CM | POA: Diagnosis not present

## 2016-07-21 NOTE — Telephone Encounter (Signed)
  I called patient. I talk with the wife. CT scan of the brain shows no new changes, no evidence of subdural. The patient is sleeping better, he is improved with his confusion, the patient likely had a delirium state following a concussion.   CT head 07/20/16:  IMPRESSION:  Abnormal CT head (without) demonstrating: 1. Moderate-severe atrophy, primarily in the mesial temporal region. 2. Moderate ventriculomegaly on ex vacuo basis. 3. Moderate periventricular and subcortical chronic small vessel ischemic disease. 4. No acute findings. 5. No major new changes compared to CT on 07/04/16. The right scalp hematoma has reduced in size.

## 2016-07-25 ENCOUNTER — Other Ambulatory Visit: Payer: Medicare Other

## 2016-07-25 DIAGNOSIS — R42 Dizziness and giddiness: Secondary | ICD-10-CM | POA: Diagnosis not present

## 2016-07-25 DIAGNOSIS — R55 Syncope and collapse: Secondary | ICD-10-CM | POA: Diagnosis not present

## 2016-07-26 ENCOUNTER — Telehealth: Payer: Self-pay | Admitting: Neurology

## 2016-07-26 ENCOUNTER — Encounter: Payer: Self-pay | Admitting: Neurology

## 2016-07-26 NOTE — Telephone Encounter (Signed)
I called and talked with the daughter. The hydroxyzine can be discontinued and used on an as-needed basis if he has difficulty sleeping.  The question whether he may be an adequate candidate for a hospice referral, I am not sure this is the case, the patient likely will survive more than 6 months in the future. The daughter will talk to hospice again and try to get their guidelines better delineated, I will be happy to make a referral if they desire.

## 2016-07-26 NOTE — Telephone Encounter (Signed)
Pt daughter(Lois Doyle Askew) is asking for a call to discuss the management of hydrOXYzine (ATARAX/VISTARIL) 10 MG tablet as far as how long should pt take this medication and if pt needs to begin being tapered off this medication.  Pt daughter would also like to discuss resources about the care of her father, please call.

## 2016-07-26 NOTE — Telephone Encounter (Signed)
Daughter called back to advised [name] she will not be available for calls 5:30pm-6:30pm this evening.  FYI

## 2016-07-27 ENCOUNTER — Telehealth: Payer: Self-pay | Admitting: *Deleted

## 2016-07-27 DIAGNOSIS — R42 Dizziness and giddiness: Secondary | ICD-10-CM | POA: Diagnosis not present

## 2016-07-27 DIAGNOSIS — R55 Syncope and collapse: Secondary | ICD-10-CM | POA: Diagnosis not present

## 2016-07-27 NOTE — Telephone Encounter (Signed)
Faxed signed order back for "1 tab hydroxyzine (10mg ) at bedtime. May also give 1 tablet hydroxyzine for agitation 1-2 additional times per day but must be at least 2 hours after previous dose" Fax: [phone]. Received confirmation.

## 2016-07-28 ENCOUNTER — Ambulatory Visit: Payer: Medicare Other | Admitting: Neurology

## 2016-07-31 DIAGNOSIS — R42 Dizziness and giddiness: Secondary | ICD-10-CM | POA: Diagnosis not present

## 2016-07-31 DIAGNOSIS — R55 Syncope and collapse: Secondary | ICD-10-CM | POA: Diagnosis not present

## 2016-08-01 DIAGNOSIS — R05 Cough: Secondary | ICD-10-CM | POA: Diagnosis not present

## 2016-08-01 DIAGNOSIS — J301 Allergic rhinitis due to pollen: Secondary | ICD-10-CM | POA: Diagnosis not present

## 2016-08-02 ENCOUNTER — Encounter: Payer: Self-pay | Admitting: Neurology

## 2016-08-15 DIAGNOSIS — H5022 Vertical strabismus, left eye: Secondary | ICD-10-CM | POA: Diagnosis not present

## 2016-08-16 ENCOUNTER — Telehealth: Payer: Self-pay | Admitting: *Deleted

## 2016-08-16 ENCOUNTER — Encounter: Payer: Self-pay | Admitting: Neurology

## 2016-08-16 NOTE — Telephone Encounter (Signed)
I called and talked with Dr. Konrad Dolores, I'm not sure the patient has a life's family didn't 6 months, and he may not qualify for hospice, having a nurse come out and evaluate him for this is okay, they will send someone out.

## 2016-08-16 NOTE — Telephone Encounter (Signed)
Took call from phone staff. Spoke with Dr. Court Joy, MD (supervisor of hospitce/palliative care).  He is wanting to know if CW,MD okay with giving VO to place referral for hospice to have a nurse go out to evaluate if patient qualifies. If not, they could possibly do palliative care.  They want to at least evaluate patient for the need if okay with Dr Jannifer Franklin.  His cell number: [phone].  He stated it is okay for RN to call and give VO if Dr Jannifer Franklin agrees.

## 2016-08-22 DIAGNOSIS — R1310 Dysphagia, unspecified: Secondary | ICD-10-CM | POA: Diagnosis not present

## 2016-08-22 DIAGNOSIS — N401 Enlarged prostate with lower urinary tract symptoms: Secondary | ICD-10-CM | POA: Diagnosis not present

## 2016-08-22 DIAGNOSIS — G20 Parkinson's disease: Secondary | ICD-10-CM | POA: Diagnosis not present

## 2016-08-22 DIAGNOSIS — J309 Allergic rhinitis, unspecified: Secondary | ICD-10-CM | POA: Diagnosis not present

## 2016-08-22 DIAGNOSIS — I10 Essential (primary) hypertension: Secondary | ICD-10-CM | POA: Diagnosis not present

## 2016-08-22 DIAGNOSIS — I951 Orthostatic hypotension: Secondary | ICD-10-CM | POA: Diagnosis not present

## 2016-08-22 DIAGNOSIS — G3183 Dementia with Lewy bodies: Secondary | ICD-10-CM | POA: Diagnosis not present

## 2016-08-23 DIAGNOSIS — G20 Parkinson's disease: Secondary | ICD-10-CM | POA: Diagnosis not present

## 2016-08-23 DIAGNOSIS — I951 Orthostatic hypotension: Secondary | ICD-10-CM | POA: Diagnosis not present

## 2016-08-23 DIAGNOSIS — R1310 Dysphagia, unspecified: Secondary | ICD-10-CM | POA: Diagnosis not present

## 2016-08-23 DIAGNOSIS — N401 Enlarged prostate with lower urinary tract symptoms: Secondary | ICD-10-CM | POA: Diagnosis not present

## 2016-08-23 DIAGNOSIS — I10 Essential (primary) hypertension: Secondary | ICD-10-CM | POA: Diagnosis not present

## 2016-08-23 DIAGNOSIS — G3183 Dementia with Lewy bodies: Secondary | ICD-10-CM | POA: Diagnosis not present

## 2016-08-29 DIAGNOSIS — N401 Enlarged prostate with lower urinary tract symptoms: Secondary | ICD-10-CM | POA: Diagnosis not present

## 2016-08-29 DIAGNOSIS — G20 Parkinson's disease: Secondary | ICD-10-CM | POA: Diagnosis not present

## 2016-08-29 DIAGNOSIS — I951 Orthostatic hypotension: Secondary | ICD-10-CM | POA: Diagnosis not present

## 2016-08-29 DIAGNOSIS — I10 Essential (primary) hypertension: Secondary | ICD-10-CM | POA: Diagnosis not present

## 2016-08-29 DIAGNOSIS — G3183 Dementia with Lewy bodies: Secondary | ICD-10-CM | POA: Diagnosis not present

## 2016-08-29 DIAGNOSIS — R1310 Dysphagia, unspecified: Secondary | ICD-10-CM | POA: Diagnosis not present

## 2016-09-05 DIAGNOSIS — R1310 Dysphagia, unspecified: Secondary | ICD-10-CM | POA: Diagnosis not present

## 2016-09-05 DIAGNOSIS — I951 Orthostatic hypotension: Secondary | ICD-10-CM | POA: Diagnosis not present

## 2016-09-05 DIAGNOSIS — I10 Essential (primary) hypertension: Secondary | ICD-10-CM | POA: Diagnosis not present

## 2016-09-05 DIAGNOSIS — G20 Parkinson's disease: Secondary | ICD-10-CM | POA: Diagnosis not present

## 2016-09-05 DIAGNOSIS — N401 Enlarged prostate with lower urinary tract symptoms: Secondary | ICD-10-CM | POA: Diagnosis not present

## 2016-09-05 DIAGNOSIS — G3183 Dementia with Lewy bodies: Secondary | ICD-10-CM | POA: Diagnosis not present

## 2016-09-06 DIAGNOSIS — G3183 Dementia with Lewy bodies: Secondary | ICD-10-CM | POA: Diagnosis not present

## 2016-09-06 DIAGNOSIS — R1310 Dysphagia, unspecified: Secondary | ICD-10-CM | POA: Diagnosis not present

## 2016-09-06 DIAGNOSIS — I951 Orthostatic hypotension: Secondary | ICD-10-CM | POA: Diagnosis not present

## 2016-09-06 DIAGNOSIS — N401 Enlarged prostate with lower urinary tract symptoms: Secondary | ICD-10-CM | POA: Diagnosis not present

## 2016-09-06 DIAGNOSIS — I10 Essential (primary) hypertension: Secondary | ICD-10-CM | POA: Diagnosis not present

## 2016-09-06 DIAGNOSIS — J209 Acute bronchitis, unspecified: Secondary | ICD-10-CM | POA: Diagnosis not present

## 2016-09-06 DIAGNOSIS — G20 Parkinson's disease: Secondary | ICD-10-CM | POA: Diagnosis not present

## 2016-09-07 DIAGNOSIS — N401 Enlarged prostate with lower urinary tract symptoms: Secondary | ICD-10-CM | POA: Diagnosis not present

## 2016-09-07 DIAGNOSIS — G3183 Dementia with Lewy bodies: Secondary | ICD-10-CM | POA: Diagnosis not present

## 2016-09-07 DIAGNOSIS — R1310 Dysphagia, unspecified: Secondary | ICD-10-CM | POA: Diagnosis not present

## 2016-09-07 DIAGNOSIS — I10 Essential (primary) hypertension: Secondary | ICD-10-CM | POA: Diagnosis not present

## 2016-09-07 DIAGNOSIS — G20 Parkinson's disease: Secondary | ICD-10-CM | POA: Diagnosis not present

## 2016-09-07 DIAGNOSIS — I951 Orthostatic hypotension: Secondary | ICD-10-CM | POA: Diagnosis not present

## 2016-09-08 DIAGNOSIS — G20 Parkinson's disease: Secondary | ICD-10-CM | POA: Diagnosis not present

## 2016-09-08 DIAGNOSIS — R1310 Dysphagia, unspecified: Secondary | ICD-10-CM | POA: Diagnosis not present

## 2016-09-08 DIAGNOSIS — G3183 Dementia with Lewy bodies: Secondary | ICD-10-CM | POA: Diagnosis not present

## 2016-09-08 DIAGNOSIS — I10 Essential (primary) hypertension: Secondary | ICD-10-CM | POA: Diagnosis not present

## 2016-09-08 DIAGNOSIS — I951 Orthostatic hypotension: Secondary | ICD-10-CM | POA: Diagnosis not present

## 2016-09-08 DIAGNOSIS — N401 Enlarged prostate with lower urinary tract symptoms: Secondary | ICD-10-CM | POA: Diagnosis not present

## 2016-09-09 DIAGNOSIS — G3183 Dementia with Lewy bodies: Secondary | ICD-10-CM | POA: Diagnosis not present

## 2016-09-09 DIAGNOSIS — I10 Essential (primary) hypertension: Secondary | ICD-10-CM | POA: Diagnosis not present

## 2016-09-09 DIAGNOSIS — G20 Parkinson's disease: Secondary | ICD-10-CM | POA: Diagnosis not present

## 2016-09-09 DIAGNOSIS — R1310 Dysphagia, unspecified: Secondary | ICD-10-CM | POA: Diagnosis not present

## 2016-09-09 DIAGNOSIS — I951 Orthostatic hypotension: Secondary | ICD-10-CM | POA: Diagnosis not present

## 2016-09-09 DIAGNOSIS — J309 Allergic rhinitis, unspecified: Secondary | ICD-10-CM | POA: Diagnosis not present

## 2016-09-09 DIAGNOSIS — N401 Enlarged prostate with lower urinary tract symptoms: Secondary | ICD-10-CM | POA: Diagnosis not present

## 2016-09-12 DIAGNOSIS — I10 Essential (primary) hypertension: Secondary | ICD-10-CM | POA: Diagnosis not present

## 2016-09-12 DIAGNOSIS — N401 Enlarged prostate with lower urinary tract symptoms: Secondary | ICD-10-CM | POA: Diagnosis not present

## 2016-09-12 DIAGNOSIS — R1310 Dysphagia, unspecified: Secondary | ICD-10-CM | POA: Diagnosis not present

## 2016-09-12 DIAGNOSIS — I951 Orthostatic hypotension: Secondary | ICD-10-CM | POA: Diagnosis not present

## 2016-09-12 DIAGNOSIS — G3183 Dementia with Lewy bodies: Secondary | ICD-10-CM | POA: Diagnosis not present

## 2016-09-12 DIAGNOSIS — G20 Parkinson's disease: Secondary | ICD-10-CM | POA: Diagnosis not present

## 2016-09-19 ENCOUNTER — Telehealth: Payer: Self-pay | Admitting: Neurology

## 2016-09-19 DIAGNOSIS — R1310 Dysphagia, unspecified: Secondary | ICD-10-CM | POA: Diagnosis not present

## 2016-09-19 DIAGNOSIS — I951 Orthostatic hypotension: Secondary | ICD-10-CM | POA: Diagnosis not present

## 2016-09-19 DIAGNOSIS — I10 Essential (primary) hypertension: Secondary | ICD-10-CM | POA: Diagnosis not present

## 2016-09-19 DIAGNOSIS — G3183 Dementia with Lewy bodies: Secondary | ICD-10-CM | POA: Diagnosis not present

## 2016-09-19 DIAGNOSIS — N401 Enlarged prostate with lower urinary tract symptoms: Secondary | ICD-10-CM | POA: Diagnosis not present

## 2016-09-19 DIAGNOSIS — G20 Parkinson's disease: Secondary | ICD-10-CM | POA: Diagnosis not present

## 2016-09-19 MED ORDER — GUAIFENESIN ER 600 MG PO TB12: 600.0000 mg | ORAL_TABLET | Freq: Two times a day (BID) | ORAL | 1 refills | Status: DC | PRN

## 2016-09-19 NOTE — Telephone Encounter (Signed)
I will call in guaifenesin.

## 2016-09-19 NOTE — Addendum Note (Signed)
Addended by: Kathrynn Ducking on: 09/19/2016 04:38 PM   Modules accepted: Orders

## 2016-09-19 NOTE — Telephone Encounter (Signed)
Brian Aguirre with Hospice is calling. The patient needs symptom management for cough and congestion. The patient has tried several days of Chlorpheniramine already. They recommend Guaifenesin. The patient uses [facility]. A returned call is not needed unless there are questions.

## 2016-09-25 DIAGNOSIS — G3183 Dementia with Lewy bodies: Secondary | ICD-10-CM | POA: Diagnosis not present

## 2016-09-25 DIAGNOSIS — I10 Essential (primary) hypertension: Secondary | ICD-10-CM | POA: Diagnosis not present

## 2016-09-25 DIAGNOSIS — N401 Enlarged prostate with lower urinary tract symptoms: Secondary | ICD-10-CM | POA: Diagnosis not present

## 2016-09-25 DIAGNOSIS — G20 Parkinson's disease: Secondary | ICD-10-CM | POA: Diagnosis not present

## 2016-09-25 DIAGNOSIS — I951 Orthostatic hypotension: Secondary | ICD-10-CM | POA: Diagnosis not present

## 2016-09-25 DIAGNOSIS — R1310 Dysphagia, unspecified: Secondary | ICD-10-CM | POA: Diagnosis not present

## 2016-09-26 ENCOUNTER — Other Ambulatory Visit: Payer: Self-pay | Admitting: Neurology

## 2016-09-26 DIAGNOSIS — I951 Orthostatic hypotension: Secondary | ICD-10-CM | POA: Diagnosis not present

## 2016-09-26 DIAGNOSIS — N401 Enlarged prostate with lower urinary tract symptoms: Secondary | ICD-10-CM | POA: Diagnosis not present

## 2016-09-26 DIAGNOSIS — G20 Parkinson's disease: Secondary | ICD-10-CM | POA: Diagnosis not present

## 2016-09-26 DIAGNOSIS — R1310 Dysphagia, unspecified: Secondary | ICD-10-CM | POA: Diagnosis not present

## 2016-09-26 DIAGNOSIS — G3183 Dementia with Lewy bodies: Secondary | ICD-10-CM | POA: Diagnosis not present

## 2016-09-26 DIAGNOSIS — I10 Essential (primary) hypertension: Secondary | ICD-10-CM | POA: Diagnosis not present

## 2016-09-27 ENCOUNTER — Encounter: Payer: Self-pay | Admitting: Neurology

## 2016-09-27 ENCOUNTER — Telehealth: Payer: Self-pay | Admitting: *Deleted

## 2016-09-27 NOTE — Telephone Encounter (Signed)
Faxed appeal letter to [employer] c/o appeals coordinator at [phone]. Awaiting response.

## 2016-09-27 NOTE — Telephone Encounter (Signed)
Pt daughter (on [place] from 2015) is asking to be called once there is a response from [employer] and to have the update immediately sent over to  [place], [place]. [phone] (Phone) [phone] (Fax)

## 2016-09-27 NOTE — Telephone Encounter (Signed)
Faxed back answered questions with appeal letter attached to [employer] at [phone]. Received fax confirmation.

## 2016-09-27 NOTE — Telephone Encounter (Signed)
Leif/[employer] called, RN not available to speak with him. He will fax the questions reg the appeal to [facility] at [phone].

## 2016-09-27 NOTE — Telephone Encounter (Signed)
Received request from [place] to complete PA for amitiza 74mcg capsule. Called [employer] at [phone] and spoke with [place]. Initiated PA. She created key for PA on covermymeds.  Key: [id number]. I submitted clinical information on covermymeds. Awaiting response.  [place]- [phone]

## 2016-09-27 NOTE — Telephone Encounter (Signed)
I will write an appeal letter for Amitiza.

## 2016-09-28 NOTE — Telephone Encounter (Signed)
Called and spoke with Margarita Grizzle at [facility]. Advised I faxed over letter from [employer]. They need to contact hospice to try and get approval for amitiza. She will call back and ask to speak with me if they do not receive letter via fax.  They will contact hospice.

## 2016-09-28 NOTE — Telephone Encounter (Signed)
Called and spoke with daughter. She requested copy of letter be sent via mychart so she can have a copy. I sent letter as requested.  She states they are going to pay out of pocket at [facility] for 28 capsules. I did advise they can look into using [email or web address]. They can use [facility] at no cost. They would pay out of pocket, but usually medication will be cheaper. She verbalized understanding and appreciation. She will look into this.  She is going to try and contact insurance company herself to see if she can try and get medication covered. She is also going to call hospice nurse to f/u with them. She will call back if she needs anything further.

## 2016-09-28 NOTE — Telephone Encounter (Signed)
Called and left detailed message for daughter informing her of message below. Advised her to call back if she has further questions/concerns.

## 2016-09-28 NOTE — Telephone Encounter (Signed)
Received notice from [employer] that appeal denied for rx Amitiza d/t "will not be approved for separate payment under Medicare Part D because it is being used to manage sx associated with your terminal condition or other conditions related to your terminal illness. Medications used for treatment, palliation, and/or management of the terminal and/or related condition when enrolled in Hospice may be covered under your Hospice-medicare part A benefit. The denial is therefore appropriate and consistent with your benefits"  I forwarded letter to [facility] and advised them to contact Hospice to try and get approval. Fax: [phone]. Received fax confirmation.

## 2016-09-28 NOTE — Telephone Encounter (Signed)
Pt daughter is aware that the letter Dr Jannifer Franklin wrote was denied and she is going to contact the insurance company herself.  Pt daughter is asking for a copy of the letter Dr Jannifer Franklin sent to help her in speaking with the insurance company.  Pt daughter also states that she just needs to speak with RN Terrence Dupont, she can not specify an exact time that she will be available but she will do her best to take the call when RN Terrence Dupont calls her back

## 2016-09-29 DIAGNOSIS — N401 Enlarged prostate with lower urinary tract symptoms: Secondary | ICD-10-CM | POA: Diagnosis not present

## 2016-09-29 DIAGNOSIS — G3183 Dementia with Lewy bodies: Secondary | ICD-10-CM | POA: Diagnosis not present

## 2016-09-29 DIAGNOSIS — G20 Parkinson's disease: Secondary | ICD-10-CM | POA: Diagnosis not present

## 2016-09-29 DIAGNOSIS — R1310 Dysphagia, unspecified: Secondary | ICD-10-CM | POA: Diagnosis not present

## 2016-09-29 DIAGNOSIS — I951 Orthostatic hypotension: Secondary | ICD-10-CM | POA: Diagnosis not present

## 2016-09-29 DIAGNOSIS — I10 Essential (primary) hypertension: Secondary | ICD-10-CM | POA: Diagnosis not present

## 2016-10-03 DIAGNOSIS — N401 Enlarged prostate with lower urinary tract symptoms: Secondary | ICD-10-CM | POA: Diagnosis not present

## 2016-10-03 DIAGNOSIS — G3183 Dementia with Lewy bodies: Secondary | ICD-10-CM | POA: Diagnosis not present

## 2016-10-03 DIAGNOSIS — G20 Parkinson's disease: Secondary | ICD-10-CM | POA: Diagnosis not present

## 2016-10-03 DIAGNOSIS — I10 Essential (primary) hypertension: Secondary | ICD-10-CM | POA: Diagnosis not present

## 2016-10-03 DIAGNOSIS — I951 Orthostatic hypotension: Secondary | ICD-10-CM | POA: Diagnosis not present

## 2016-10-03 DIAGNOSIS — R1310 Dysphagia, unspecified: Secondary | ICD-10-CM | POA: Diagnosis not present

## 2016-10-04 ENCOUNTER — Telehealth: Payer: Self-pay | Admitting: Neurology

## 2016-10-04 ENCOUNTER — Encounter: Payer: Self-pay | Admitting: Neurology

## 2016-10-04 ENCOUNTER — Telehealth: Payer: Self-pay | Admitting: *Deleted

## 2016-10-04 DIAGNOSIS — S060X0D Concussion without loss of consciousness, subsequent encounter: Secondary | ICD-10-CM

## 2016-10-04 NOTE — Telephone Encounter (Signed)
I called the patient. Over the last 3 weeks he has had increasing problems with fatigue, no change in medications, his blood pressure remains low in the morning, but comes up later in the day. He is eating and drinking fairly well. He sleeps most of the day.  6 weeks ago he fell and hit his head, he fractured 2 ribs.  Given history of head trauma, I will check a CT scan of brain to exclude a subdural hematoma.  We may consider blood work if the CT is unremarkable. The etiology of the new fatigue problem and drowsiness is not clear.

## 2016-10-04 NOTE — Telephone Encounter (Signed)
Faxed back signed orders to [facility] and [place] at [phone]. Received fax confirmation.

## 2016-10-04 NOTE — Telephone Encounter (Signed)
Pt called in he is very weak, no stamina, eating is a little less. Pt is requesting Dr Viona Gilmore to please call to discuss

## 2016-10-05 ENCOUNTER — Telehealth: Payer: Self-pay | Admitting: Neurology

## 2016-10-05 NOTE — Telephone Encounter (Signed)
    I called the daughter. The patient did not fall 6 weeks ago, the patient is confused. He is losing weight, the decline in the energy level is chronic in nature, not just over the last 3 weeks.  I will cancel the order for the CT scan of the brain.  The patient has a follow-up appointment in October. The daughter will let them know that we canceled the CT.

## 2016-10-05 NOTE — Telephone Encounter (Signed)
I tried to call, unable to leave a message. The patient still having some congestion, this may be related more to aspiration, not just lung congestion. Prevention of aspiration with keeping the head up while feeding, possibly thickening liquids may be of some benefit.

## 2016-10-05 NOTE — Telephone Encounter (Signed)
Sherita with [facility] called in reference to recent visit with patient on Tuesday.  Patient continues to have congestion even with using Mucinex.  Sherita would like to know if [name] has any other recommendations to help with the congestion.  Pharmacy- [facility].  Please call Sherita @ [phone].

## 2016-10-05 NOTE — Telephone Encounter (Signed)
Pt daughter(on DPR) is asking that RN Terrence Dupont review last my chart response and give her a call.  Pt daughter is wanting to ensure everyone is on 1 accord.  Please call

## 2016-10-05 NOTE — Telephone Encounter (Signed)
I called and left a message. The CT scan of the head has been discontinued, the patient has not had a recent fall.  I talk with Dr. Earlie Counts. The patient has been getting blood pressure checks every 2 hours, this probably is not needed at this point, could probably check blood pressures in the morning and possibly once or twice later in the afternoon or evening.  The patient is having some problems with pulmonary congestion, this may represent aspiration, need to make sure that the patient is fully upright when eating, may need to thicken liquids with meals.

## 2016-10-06 DIAGNOSIS — N401 Enlarged prostate with lower urinary tract symptoms: Secondary | ICD-10-CM | POA: Diagnosis not present

## 2016-10-06 DIAGNOSIS — G3183 Dementia with Lewy bodies: Secondary | ICD-10-CM | POA: Diagnosis not present

## 2016-10-06 DIAGNOSIS — R1310 Dysphagia, unspecified: Secondary | ICD-10-CM | POA: Diagnosis not present

## 2016-10-06 DIAGNOSIS — I10 Essential (primary) hypertension: Secondary | ICD-10-CM | POA: Diagnosis not present

## 2016-10-06 DIAGNOSIS — G20 Parkinson's disease: Secondary | ICD-10-CM | POA: Diagnosis not present

## 2016-10-06 DIAGNOSIS — I951 Orthostatic hypotension: Secondary | ICD-10-CM | POA: Diagnosis not present

## 2016-10-09 DIAGNOSIS — N401 Enlarged prostate with lower urinary tract symptoms: Secondary | ICD-10-CM | POA: Diagnosis not present

## 2016-10-09 DIAGNOSIS — I951 Orthostatic hypotension: Secondary | ICD-10-CM | POA: Diagnosis not present

## 2016-10-09 DIAGNOSIS — G20 Parkinson's disease: Secondary | ICD-10-CM | POA: Diagnosis not present

## 2016-10-09 DIAGNOSIS — G3183 Dementia with Lewy bodies: Secondary | ICD-10-CM | POA: Diagnosis not present

## 2016-10-09 DIAGNOSIS — I10 Essential (primary) hypertension: Secondary | ICD-10-CM | POA: Diagnosis not present

## 2016-10-09 DIAGNOSIS — R1310 Dysphagia, unspecified: Secondary | ICD-10-CM | POA: Diagnosis not present

## 2016-10-09 DIAGNOSIS — J309 Allergic rhinitis, unspecified: Secondary | ICD-10-CM | POA: Diagnosis not present

## 2016-10-10 DIAGNOSIS — I10 Essential (primary) hypertension: Secondary | ICD-10-CM | POA: Diagnosis not present

## 2016-10-10 DIAGNOSIS — G20 Parkinson's disease: Secondary | ICD-10-CM | POA: Diagnosis not present

## 2016-10-10 DIAGNOSIS — R1310 Dysphagia, unspecified: Secondary | ICD-10-CM | POA: Diagnosis not present

## 2016-10-10 DIAGNOSIS — N401 Enlarged prostate with lower urinary tract symptoms: Secondary | ICD-10-CM | POA: Diagnosis not present

## 2016-10-10 DIAGNOSIS — G3183 Dementia with Lewy bodies: Secondary | ICD-10-CM | POA: Diagnosis not present

## 2016-10-10 DIAGNOSIS — I951 Orthostatic hypotension: Secondary | ICD-10-CM | POA: Diagnosis not present

## 2016-10-17 DIAGNOSIS — I10 Essential (primary) hypertension: Secondary | ICD-10-CM | POA: Diagnosis not present

## 2016-10-17 DIAGNOSIS — I951 Orthostatic hypotension: Secondary | ICD-10-CM | POA: Diagnosis not present

## 2016-10-17 DIAGNOSIS — R1310 Dysphagia, unspecified: Secondary | ICD-10-CM | POA: Diagnosis not present

## 2016-10-17 DIAGNOSIS — G3183 Dementia with Lewy bodies: Secondary | ICD-10-CM | POA: Diagnosis not present

## 2016-10-17 DIAGNOSIS — N401 Enlarged prostate with lower urinary tract symptoms: Secondary | ICD-10-CM | POA: Diagnosis not present

## 2016-10-17 DIAGNOSIS — G20 Parkinson's disease: Secondary | ICD-10-CM | POA: Diagnosis not present

## 2016-10-24 DIAGNOSIS — I10 Essential (primary) hypertension: Secondary | ICD-10-CM | POA: Diagnosis not present

## 2016-10-24 DIAGNOSIS — G3183 Dementia with Lewy bodies: Secondary | ICD-10-CM | POA: Diagnosis not present

## 2016-10-24 DIAGNOSIS — I951 Orthostatic hypotension: Secondary | ICD-10-CM | POA: Diagnosis not present

## 2016-10-24 DIAGNOSIS — G20 Parkinson's disease: Secondary | ICD-10-CM | POA: Diagnosis not present

## 2016-10-24 DIAGNOSIS — R1310 Dysphagia, unspecified: Secondary | ICD-10-CM | POA: Diagnosis not present

## 2016-10-24 DIAGNOSIS — N401 Enlarged prostate with lower urinary tract symptoms: Secondary | ICD-10-CM | POA: Diagnosis not present

## 2016-10-25 ENCOUNTER — Other Ambulatory Visit: Payer: Self-pay | Admitting: Neurology

## 2016-10-27 ENCOUNTER — Encounter: Payer: Self-pay | Admitting: Neurology

## 2016-10-27 ENCOUNTER — Ambulatory Visit (INDEPENDENT_AMBULATORY_CARE_PROVIDER_SITE_OTHER): Payer: Medicare Other | Admitting: Neurology

## 2016-10-27 VITALS — BP 159/72 | HR 62 | Ht 72.0 in | Wt 137.5 lb

## 2016-10-27 DIAGNOSIS — I951 Orthostatic hypotension: Secondary | ICD-10-CM

## 2016-10-27 DIAGNOSIS — R269 Unspecified abnormalities of gait and mobility: Secondary | ICD-10-CM | POA: Diagnosis not present

## 2016-10-27 DIAGNOSIS — G20 Parkinson's disease: Secondary | ICD-10-CM | POA: Diagnosis not present

## 2016-10-27 MED ORDER — PYRIDOSTIGMINE BROMIDE 60 MG PO TABS: 30.0000 mg | ORAL_TABLET | ORAL | 1 refills | Status: AC

## 2016-10-27 NOTE — Patient Instructions (Signed)
   We will use 30 mg (1/2 tablet) in the morning for the orthostatic hypotension.

## 2016-10-27 NOTE — Progress Notes (Signed)
Reason for visit: Parkinson's disease  Brian Aguirre is an 83 y.o. male  History of present illness:  Brian Aguirre is an 83 year old right-handed white male with a history of Parkinson's disease. The patient has continued to progress, his ability to ambulate has declined but he is still able to ambulate some with a walker. He has a very definite tendency to lean backwards, he has had no further falls. He now is in hospice. He continues to have occasional episodes of hypotension in the morning that seems to wipe him out the rest of the day. He has chronic fatigue. He has some low back pain, he is on Tylenol for this. He will have a lot of phlegm he coughs up in the morning. He has developed increasing problems with apraxias, he has difficulty putting toothpaste on his toothbrush, he may try to turn on the TV with the remote that controls his lift chair. The patient is having some trouble with memory as well. He comes back to the office today for an evaluation.  Past Medical History:  Diagnosis Date  . Benign enlargement of prostate   . Chronic constipation 02/26/2014  . Contusion of forehead 03/09/2014  . Depression   . Dizziness   . Fracture, rib   . Gait disturbance   . History of fall 03/09/2014  . Hypertension   . Memory difficulties 07/20/2014  . Memory loss   . Orthostatic hypotension   . Osteoporosis   . Parkinson disease [facility])     Past Surgical History:  Procedure Laterality Date  . APPENDECTOMY    . CATARACT EXTRACTION Left   . HERNIA REPAIR    . TONSILLECTOMY      Family History  Problem Relation Age of Onset  . Cancer Mother   . Heart failure Father   . Breast cancer Daughter     Social history:  reports that he has never smoked. He has never used smokeless tobacco. He reports that he does not drink alcohol or use drugs.    Allergies  Allergen Reactions  . Myrbetriq [Mirabegron] Nausea And Vomiting  . Stalevo [Carbidopa-Levodopa-Entacapone] Diarrhea    GI  upset    Medications:  Prior to Admission medications   Medication Sig Start Date End Date Taking? Authorizing Provider  AMITIZA 24 MCG capsule TAKE 1 CAPSULE TWICE DAILY WITH MEALS. 09/26/16  Yes Kathrynn Ducking, MD  aspirin EC 81 MG tablet Take 81 mg by mouth at bedtime.   Yes [provider]  carbidopa-levodopa (SINEMET IR) 25-100 MG tablet TAKE 1 TABLET 3 TIMES A DAY. Patient taking differently: Take 0.5-1 tablets 3 times daily; 1 tab at 10a, 0.5 tab at 3p, 0.5 tab at 7p 06/22/16  Yes Kathrynn Ducking, MD  carbidopa-levodopa (SINEMET IR) 25-250 MG tablet TAKE  (1)  TABLET  FOUR TIMES DAILY. Patient taking differently: Take 1 tablet 3 times daily: takes at 7:30a, 12:30p, 4:30p 12/01/15  Yes Kathrynn Ducking, MD  docusate sodium (COLACE) 100 MG capsule Take 200 mg by mouth at bedtime.    Yes [provider]  MUCINEX 600 MG 12 hr tablet TAKE (1) TABLET TWICE A DAY AS NEEDED. 10/25/16  Yes Kathrynn Ducking, MD  pramipexole (MIRAPEX) 0.75 MG tablet Take 1 tablet (0.75 mg total) by mouth 3 (three) times daily. 07/10/16  Yes Kathrynn Ducking, MD  Probiotic Product (ALIGN PO) Take 1 capsule by mouth daily.   Yes [provider]  ranitidine (ZANTAC) 75 MG  tablet Take 75 mg by mouth daily as needed for heartburn (reflux).    Yes [provider]  RYTARY 48.75-195 MG CPCR TAKE 1 CAPSULE DAILY AFTER SUPPER. Patient taking differently: Take 1 capsule daily at bedtime (9p) 05/16/16  Yes Kathrynn Ducking, MD  selegiline (ELDEPRYL) 5 MG capsule TAKE 1 CAPSULE TWICE DAILY WITH BREAKFAST AND LUNCH. 09/26/16  Yes Kathrynn Ducking, MD  triamcinolone (NASACORT) 55 MCG/ACT AERO nasal inhaler Place 2 sprays into the nose as needed (allergies).   Yes [provider]  vitamin B-12 (CYANOCOBALAMIN) 500 MCG tablet Take 500 mcg by mouth every morning.    Yes [provider]  vitamin D, CHOLECALCIFEROL, 400 UNITS tablet Take 400 Units by mouth every morning.   Yes  [provider]    ROS:  Out of a complete 14 system review of symptoms, the patient complains only of the following symptoms, and all other reviewed systems are negative.  Decreased appetite, weight loss Difficulty swallowing, drooling Double vision Cough, wheezing Constipation Daytime. Sleepiness Back pain Bruising easily Dizziness, speech difficulty, weakness, tremors Confusion, depression, hallucinations  Blood pressure (!) 159/72, pulse 62, height 6' (1.829 m), weight 137 lb 8 oz (62.4 kg).  Physical Exam  General: The patient is alert and cooperative at the time of the examination.  Respiratory: Occasional wheezes at the bases are noted bilaterally.  Cardiovascular: Regular rate and rhythm, no murmurs are noted.  Skin: No significant peripheral edema is noted.   Neurologic Exam  Mental status: The patient is alert and oriented x 3 at the time of the examination. The patient has apparent normal recent and remote memory, with an apparently normal attention span and concentration ability.   Cranial nerves: Facial symmetry is present. Speech is normal, no aphasia or dysarthria is noted. Extraocular movements are full. Visual fields are full. Masking of the face is seen.  Motor: The patient has good strength in all 4 extremities.  Sensory examination: Soft touch sensation is symmetric on the face, arms, and legs.  Coordination: The patient has good finger-nose-finger and heel-to-shin bilaterally. Patient has apraxia with the use of the extremities, however.  Gait and station: The patient requires assistance with standing, he leans backwards significantly, he is not able to effectively ambulate.  Reflexes: Deep tendon reflexes are symmetric.   CT head 07/20/16:  IMPRESSION:  Abnormal CT head (without) demonstrating: 1. Moderate-severe atrophy, primarily in the mesial temporal region. 2. Moderate ventriculomegaly on ex vacuo basis. 3. Moderate  periventricular and subcortical chronic small vessel ischemic disease. 4. No acute findings. 5. No major new changes compared to CT on 07/04/16. The right scalp hematoma has reduced in size.  * CT scan images were reviewed online. I agree with the written report.   Assessment/Plan:  1. Parkinson's disease  2. Gait disorder  3. Memory disorder  4. Orthostatic hypotension  The patient is having a lot of phlegm in the morning and this likely is related to pooling of saliva overnight. The patient is having increased problems with fatigue, he has difficulty with leaning backwards with trying to walk. His blood pressures still remain somewhat of a problem in the morning, we will add low-dose Mestinon 30 mg in the morning. The patient will follow-up in about 4 months.  Jill Alexanders MD 10/27/2016 12:30 PM  [facility] [address] [place], [place] [address]  Phone [phone] Fax [phone]

## 2016-10-31 DIAGNOSIS — R1310 Dysphagia, unspecified: Secondary | ICD-10-CM | POA: Diagnosis not present

## 2016-10-31 DIAGNOSIS — I10 Essential (primary) hypertension: Secondary | ICD-10-CM | POA: Diagnosis not present

## 2016-10-31 DIAGNOSIS — G20 Parkinson's disease: Secondary | ICD-10-CM | POA: Diagnosis not present

## 2016-10-31 DIAGNOSIS — I951 Orthostatic hypotension: Secondary | ICD-10-CM | POA: Diagnosis not present

## 2016-10-31 DIAGNOSIS — G3183 Dementia with Lewy bodies: Secondary | ICD-10-CM | POA: Diagnosis not present

## 2016-10-31 DIAGNOSIS — N401 Enlarged prostate with lower urinary tract symptoms: Secondary | ICD-10-CM | POA: Diagnosis not present

## 2016-11-02 ENCOUNTER — Telehealth: Payer: Self-pay | Admitting: *Deleted

## 2016-11-02 NOTE — Telephone Encounter (Signed)
Faxed back signed orders to [facility] [place] re: "duoneb inhaler solution 0/5-2.5mg /mL 3x/day INH for wheezing; inhale TID for wheezing-ended 10/10/16, duoneb inhaler solution 0/5-2.5mg /mL as needed; every 4 hr prn- ended 10/10/16".  Fax: [phone]. Received fax confirmation.

## 2016-11-07 DIAGNOSIS — G3183 Dementia with Lewy bodies: Secondary | ICD-10-CM | POA: Diagnosis not present

## 2016-11-07 DIAGNOSIS — I951 Orthostatic hypotension: Secondary | ICD-10-CM | POA: Diagnosis not present

## 2016-11-07 DIAGNOSIS — I10 Essential (primary) hypertension: Secondary | ICD-10-CM | POA: Diagnosis not present

## 2016-11-07 DIAGNOSIS — R1310 Dysphagia, unspecified: Secondary | ICD-10-CM | POA: Diagnosis not present

## 2016-11-07 DIAGNOSIS — N401 Enlarged prostate with lower urinary tract symptoms: Secondary | ICD-10-CM | POA: Diagnosis not present

## 2016-11-07 DIAGNOSIS — G20 Parkinson's disease: Secondary | ICD-10-CM | POA: Diagnosis not present

## 2016-11-09 DIAGNOSIS — N401 Enlarged prostate with lower urinary tract symptoms: Secondary | ICD-10-CM | POA: Diagnosis not present

## 2016-11-09 DIAGNOSIS — I951 Orthostatic hypotension: Secondary | ICD-10-CM | POA: Diagnosis not present

## 2016-11-09 DIAGNOSIS — I10 Essential (primary) hypertension: Secondary | ICD-10-CM | POA: Diagnosis not present

## 2016-11-09 DIAGNOSIS — R1310 Dysphagia, unspecified: Secondary | ICD-10-CM | POA: Diagnosis not present

## 2016-11-09 DIAGNOSIS — J309 Allergic rhinitis, unspecified: Secondary | ICD-10-CM | POA: Diagnosis not present

## 2016-11-09 DIAGNOSIS — G3183 Dementia with Lewy bodies: Secondary | ICD-10-CM | POA: Diagnosis not present

## 2016-11-09 DIAGNOSIS — G20 Parkinson's disease: Secondary | ICD-10-CM | POA: Diagnosis not present

## 2016-11-13 DIAGNOSIS — G20 Parkinson's disease: Secondary | ICD-10-CM | POA: Diagnosis not present

## 2016-11-13 DIAGNOSIS — G3183 Dementia with Lewy bodies: Secondary | ICD-10-CM | POA: Diagnosis not present

## 2016-11-13 DIAGNOSIS — R1310 Dysphagia, unspecified: Secondary | ICD-10-CM | POA: Diagnosis not present

## 2016-11-13 DIAGNOSIS — N401 Enlarged prostate with lower urinary tract symptoms: Secondary | ICD-10-CM | POA: Diagnosis not present

## 2016-11-13 DIAGNOSIS — I951 Orthostatic hypotension: Secondary | ICD-10-CM | POA: Diagnosis not present

## 2016-11-13 DIAGNOSIS — I10 Essential (primary) hypertension: Secondary | ICD-10-CM | POA: Diagnosis not present

## 2016-11-14 DIAGNOSIS — G3183 Dementia with Lewy bodies: Secondary | ICD-10-CM | POA: Diagnosis not present

## 2016-11-14 DIAGNOSIS — I10 Essential (primary) hypertension: Secondary | ICD-10-CM | POA: Diagnosis not present

## 2016-11-14 DIAGNOSIS — I951 Orthostatic hypotension: Secondary | ICD-10-CM | POA: Diagnosis not present

## 2016-11-14 DIAGNOSIS — G20 Parkinson's disease: Secondary | ICD-10-CM | POA: Diagnosis not present

## 2016-11-14 DIAGNOSIS — N401 Enlarged prostate with lower urinary tract symptoms: Secondary | ICD-10-CM | POA: Diagnosis not present

## 2016-11-14 DIAGNOSIS — R1310 Dysphagia, unspecified: Secondary | ICD-10-CM | POA: Diagnosis not present

## 2016-11-15 DIAGNOSIS — I951 Orthostatic hypotension: Secondary | ICD-10-CM | POA: Diagnosis not present

## 2016-11-15 DIAGNOSIS — N401 Enlarged prostate with lower urinary tract symptoms: Secondary | ICD-10-CM | POA: Diagnosis not present

## 2016-11-15 DIAGNOSIS — R1310 Dysphagia, unspecified: Secondary | ICD-10-CM | POA: Diagnosis not present

## 2016-11-15 DIAGNOSIS — G3183 Dementia with Lewy bodies: Secondary | ICD-10-CM | POA: Diagnosis not present

## 2016-11-15 DIAGNOSIS — G20 Parkinson's disease: Secondary | ICD-10-CM | POA: Diagnosis not present

## 2016-11-15 DIAGNOSIS — I10 Essential (primary) hypertension: Secondary | ICD-10-CM | POA: Diagnosis not present

## 2016-11-17 DIAGNOSIS — G20 Parkinson's disease: Secondary | ICD-10-CM | POA: Diagnosis not present

## 2016-11-17 DIAGNOSIS — I951 Orthostatic hypotension: Secondary | ICD-10-CM | POA: Diagnosis not present

## 2016-11-17 DIAGNOSIS — G3183 Dementia with Lewy bodies: Secondary | ICD-10-CM | POA: Diagnosis not present

## 2016-11-17 DIAGNOSIS — N401 Enlarged prostate with lower urinary tract symptoms: Secondary | ICD-10-CM | POA: Diagnosis not present

## 2016-11-17 DIAGNOSIS — I10 Essential (primary) hypertension: Secondary | ICD-10-CM | POA: Diagnosis not present

## 2016-11-17 DIAGNOSIS — R1310 Dysphagia, unspecified: Secondary | ICD-10-CM | POA: Diagnosis not present

## 2016-11-21 DIAGNOSIS — G20 Parkinson's disease: Secondary | ICD-10-CM | POA: Diagnosis not present

## 2016-11-21 DIAGNOSIS — R1310 Dysphagia, unspecified: Secondary | ICD-10-CM | POA: Diagnosis not present

## 2016-11-21 DIAGNOSIS — N401 Enlarged prostate with lower urinary tract symptoms: Secondary | ICD-10-CM | POA: Diagnosis not present

## 2016-11-21 DIAGNOSIS — I951 Orthostatic hypotension: Secondary | ICD-10-CM | POA: Diagnosis not present

## 2016-11-21 DIAGNOSIS — G3183 Dementia with Lewy bodies: Secondary | ICD-10-CM | POA: Diagnosis not present

## 2016-11-21 DIAGNOSIS — I10 Essential (primary) hypertension: Secondary | ICD-10-CM | POA: Diagnosis not present

## 2016-11-28 DIAGNOSIS — G20 Parkinson's disease: Secondary | ICD-10-CM | POA: Diagnosis not present

## 2016-11-28 DIAGNOSIS — G3183 Dementia with Lewy bodies: Secondary | ICD-10-CM | POA: Diagnosis not present

## 2016-11-28 DIAGNOSIS — N401 Enlarged prostate with lower urinary tract symptoms: Secondary | ICD-10-CM | POA: Diagnosis not present

## 2016-11-28 DIAGNOSIS — I951 Orthostatic hypotension: Secondary | ICD-10-CM | POA: Diagnosis not present

## 2016-11-28 DIAGNOSIS — R1310 Dysphagia, unspecified: Secondary | ICD-10-CM | POA: Diagnosis not present

## 2016-11-28 DIAGNOSIS — I10 Essential (primary) hypertension: Secondary | ICD-10-CM | POA: Diagnosis not present

## 2016-12-05 ENCOUNTER — Other Ambulatory Visit: Payer: Self-pay | Admitting: Neurology

## 2016-12-05 ENCOUNTER — Encounter: Payer: Self-pay | Admitting: Neurology

## 2016-12-05 MED ORDER — HYDROXYZINE HCL 10 MG PO TABS: 10.0000 mg | ORAL_TABLET | Freq: Every day | ORAL | 2 refills | Status: DC

## 2016-12-06 ENCOUNTER — Other Ambulatory Visit: Payer: Self-pay | Admitting: Neurology

## 2016-12-09 DIAGNOSIS — R1310 Dysphagia, unspecified: Secondary | ICD-10-CM | POA: Diagnosis not present

## 2016-12-09 DIAGNOSIS — N401 Enlarged prostate with lower urinary tract symptoms: Secondary | ICD-10-CM | POA: Diagnosis not present

## 2016-12-09 DIAGNOSIS — I10 Essential (primary) hypertension: Secondary | ICD-10-CM | POA: Diagnosis not present

## 2016-12-09 DIAGNOSIS — J309 Allergic rhinitis, unspecified: Secondary | ICD-10-CM | POA: Diagnosis not present

## 2016-12-09 DIAGNOSIS — G3183 Dementia with Lewy bodies: Secondary | ICD-10-CM | POA: Diagnosis not present

## 2016-12-09 DIAGNOSIS — I951 Orthostatic hypotension: Secondary | ICD-10-CM | POA: Diagnosis not present

## 2016-12-09 DIAGNOSIS — G20 Parkinson's disease: Secondary | ICD-10-CM | POA: Diagnosis not present

## 2016-12-12 DIAGNOSIS — N401 Enlarged prostate with lower urinary tract symptoms: Secondary | ICD-10-CM | POA: Diagnosis not present

## 2016-12-12 DIAGNOSIS — I10 Essential (primary) hypertension: Secondary | ICD-10-CM | POA: Diagnosis not present

## 2016-12-12 DIAGNOSIS — G3183 Dementia with Lewy bodies: Secondary | ICD-10-CM | POA: Diagnosis not present

## 2016-12-12 DIAGNOSIS — G20 Parkinson's disease: Secondary | ICD-10-CM | POA: Diagnosis not present

## 2016-12-12 DIAGNOSIS — R1310 Dysphagia, unspecified: Secondary | ICD-10-CM | POA: Diagnosis not present

## 2016-12-12 DIAGNOSIS — I951 Orthostatic hypotension: Secondary | ICD-10-CM | POA: Diagnosis not present

## 2016-12-15 DIAGNOSIS — R1310 Dysphagia, unspecified: Secondary | ICD-10-CM | POA: Diagnosis not present

## 2016-12-15 DIAGNOSIS — I10 Essential (primary) hypertension: Secondary | ICD-10-CM | POA: Diagnosis not present

## 2016-12-15 DIAGNOSIS — G20 Parkinson's disease: Secondary | ICD-10-CM | POA: Diagnosis not present

## 2016-12-15 DIAGNOSIS — N401 Enlarged prostate with lower urinary tract symptoms: Secondary | ICD-10-CM | POA: Diagnosis not present

## 2016-12-15 DIAGNOSIS — G3183 Dementia with Lewy bodies: Secondary | ICD-10-CM | POA: Diagnosis not present

## 2016-12-15 DIAGNOSIS — I951 Orthostatic hypotension: Secondary | ICD-10-CM | POA: Diagnosis not present

## 2016-12-19 DIAGNOSIS — R1310 Dysphagia, unspecified: Secondary | ICD-10-CM | POA: Diagnosis not present

## 2016-12-19 DIAGNOSIS — G3183 Dementia with Lewy bodies: Secondary | ICD-10-CM | POA: Diagnosis not present

## 2016-12-19 DIAGNOSIS — N401 Enlarged prostate with lower urinary tract symptoms: Secondary | ICD-10-CM | POA: Diagnosis not present

## 2016-12-19 DIAGNOSIS — I10 Essential (primary) hypertension: Secondary | ICD-10-CM | POA: Diagnosis not present

## 2016-12-19 DIAGNOSIS — G20 Parkinson's disease: Secondary | ICD-10-CM | POA: Diagnosis not present

## 2016-12-19 DIAGNOSIS — I951 Orthostatic hypotension: Secondary | ICD-10-CM | POA: Diagnosis not present

## 2016-12-20 DIAGNOSIS — G20 Parkinson's disease: Secondary | ICD-10-CM | POA: Diagnosis not present

## 2016-12-20 DIAGNOSIS — R1310 Dysphagia, unspecified: Secondary | ICD-10-CM | POA: Diagnosis not present

## 2016-12-20 DIAGNOSIS — I951 Orthostatic hypotension: Secondary | ICD-10-CM | POA: Diagnosis not present

## 2016-12-20 DIAGNOSIS — G3183 Dementia with Lewy bodies: Secondary | ICD-10-CM | POA: Diagnosis not present

## 2016-12-20 DIAGNOSIS — N401 Enlarged prostate with lower urinary tract symptoms: Secondary | ICD-10-CM | POA: Diagnosis not present

## 2016-12-20 DIAGNOSIS — I10 Essential (primary) hypertension: Secondary | ICD-10-CM | POA: Diagnosis not present

## 2016-12-26 DIAGNOSIS — I951 Orthostatic hypotension: Secondary | ICD-10-CM | POA: Diagnosis not present

## 2016-12-26 DIAGNOSIS — R1310 Dysphagia, unspecified: Secondary | ICD-10-CM | POA: Diagnosis not present

## 2016-12-26 DIAGNOSIS — G3183 Dementia with Lewy bodies: Secondary | ICD-10-CM | POA: Diagnosis not present

## 2016-12-26 DIAGNOSIS — G20 Parkinson's disease: Secondary | ICD-10-CM | POA: Diagnosis not present

## 2016-12-26 DIAGNOSIS — I10 Essential (primary) hypertension: Secondary | ICD-10-CM | POA: Diagnosis not present

## 2016-12-26 DIAGNOSIS — N401 Enlarged prostate with lower urinary tract symptoms: Secondary | ICD-10-CM | POA: Diagnosis not present

## 2016-12-28 DIAGNOSIS — G3183 Dementia with Lewy bodies: Secondary | ICD-10-CM | POA: Diagnosis not present

## 2016-12-28 DIAGNOSIS — N401 Enlarged prostate with lower urinary tract symptoms: Secondary | ICD-10-CM | POA: Diagnosis not present

## 2016-12-28 DIAGNOSIS — I10 Essential (primary) hypertension: Secondary | ICD-10-CM | POA: Diagnosis not present

## 2016-12-28 DIAGNOSIS — I951 Orthostatic hypotension: Secondary | ICD-10-CM | POA: Diagnosis not present

## 2016-12-28 DIAGNOSIS — G20 Parkinson's disease: Secondary | ICD-10-CM | POA: Diagnosis not present

## 2016-12-28 DIAGNOSIS — R1310 Dysphagia, unspecified: Secondary | ICD-10-CM | POA: Diagnosis not present

## 2017-01-04 DIAGNOSIS — G20 Parkinson's disease: Secondary | ICD-10-CM | POA: Diagnosis not present

## 2017-01-04 DIAGNOSIS — I951 Orthostatic hypotension: Secondary | ICD-10-CM | POA: Diagnosis not present

## 2017-01-04 DIAGNOSIS — G3183 Dementia with Lewy bodies: Secondary | ICD-10-CM | POA: Diagnosis not present

## 2017-01-04 DIAGNOSIS — I10 Essential (primary) hypertension: Secondary | ICD-10-CM | POA: Diagnosis not present

## 2017-01-04 DIAGNOSIS — R1310 Dysphagia, unspecified: Secondary | ICD-10-CM | POA: Diagnosis not present

## 2017-01-04 DIAGNOSIS — N401 Enlarged prostate with lower urinary tract symptoms: Secondary | ICD-10-CM | POA: Diagnosis not present

## 2017-01-07 DIAGNOSIS — G3183 Dementia with Lewy bodies: Secondary | ICD-10-CM | POA: Diagnosis not present

## 2017-01-07 DIAGNOSIS — I10 Essential (primary) hypertension: Secondary | ICD-10-CM | POA: Diagnosis not present

## 2017-01-07 DIAGNOSIS — N401 Enlarged prostate with lower urinary tract symptoms: Secondary | ICD-10-CM | POA: Diagnosis not present

## 2017-01-07 DIAGNOSIS — R1310 Dysphagia, unspecified: Secondary | ICD-10-CM | POA: Diagnosis not present

## 2017-01-07 DIAGNOSIS — I951 Orthostatic hypotension: Secondary | ICD-10-CM | POA: Diagnosis not present

## 2017-01-07 DIAGNOSIS — G20 Parkinson's disease: Secondary | ICD-10-CM | POA: Diagnosis not present

## 2017-01-08 DIAGNOSIS — G3183 Dementia with Lewy bodies: Secondary | ICD-10-CM | POA: Diagnosis not present

## 2017-01-08 DIAGNOSIS — I951 Orthostatic hypotension: Secondary | ICD-10-CM | POA: Diagnosis not present

## 2017-01-08 DIAGNOSIS — I10 Essential (primary) hypertension: Secondary | ICD-10-CM | POA: Diagnosis not present

## 2017-01-08 DIAGNOSIS — R1310 Dysphagia, unspecified: Secondary | ICD-10-CM | POA: Diagnosis not present

## 2017-01-08 DIAGNOSIS — N401 Enlarged prostate with lower urinary tract symptoms: Secondary | ICD-10-CM | POA: Diagnosis not present

## 2017-01-08 DIAGNOSIS — G20 Parkinson's disease: Secondary | ICD-10-CM | POA: Diagnosis not present

## 2017-01-09 DIAGNOSIS — G20 Parkinson's disease: Secondary | ICD-10-CM | POA: Diagnosis not present

## 2017-01-09 DIAGNOSIS — I951 Orthostatic hypotension: Secondary | ICD-10-CM | POA: Diagnosis not present

## 2017-01-09 DIAGNOSIS — G3183 Dementia with Lewy bodies: Secondary | ICD-10-CM | POA: Diagnosis not present

## 2017-01-09 DIAGNOSIS — J309 Allergic rhinitis, unspecified: Secondary | ICD-10-CM | POA: Diagnosis not present

## 2017-01-09 DIAGNOSIS — N401 Enlarged prostate with lower urinary tract symptoms: Secondary | ICD-10-CM | POA: Diagnosis not present

## 2017-01-09 DIAGNOSIS — I10 Essential (primary) hypertension: Secondary | ICD-10-CM | POA: Diagnosis not present

## 2017-01-09 DIAGNOSIS — R1310 Dysphagia, unspecified: Secondary | ICD-10-CM | POA: Diagnosis not present

## 2017-01-10 ENCOUNTER — Encounter: Payer: Self-pay | Admitting: Neurology

## 2017-01-10 DIAGNOSIS — G3183 Dementia with Lewy bodies: Secondary | ICD-10-CM | POA: Diagnosis not present

## 2017-01-10 DIAGNOSIS — I951 Orthostatic hypotension: Secondary | ICD-10-CM | POA: Diagnosis not present

## 2017-01-10 DIAGNOSIS — I10 Essential (primary) hypertension: Secondary | ICD-10-CM | POA: Diagnosis not present

## 2017-01-10 DIAGNOSIS — R1310 Dysphagia, unspecified: Secondary | ICD-10-CM | POA: Diagnosis not present

## 2017-01-10 DIAGNOSIS — N401 Enlarged prostate with lower urinary tract symptoms: Secondary | ICD-10-CM | POA: Diagnosis not present

## 2017-01-10 DIAGNOSIS — G20 Parkinson's disease: Secondary | ICD-10-CM | POA: Diagnosis not present

## 2017-01-12 DIAGNOSIS — G20 Parkinson's disease: Secondary | ICD-10-CM | POA: Diagnosis not present

## 2017-01-12 DIAGNOSIS — N401 Enlarged prostate with lower urinary tract symptoms: Secondary | ICD-10-CM | POA: Diagnosis not present

## 2017-01-12 DIAGNOSIS — G3183 Dementia with Lewy bodies: Secondary | ICD-10-CM | POA: Diagnosis not present

## 2017-01-12 DIAGNOSIS — R1310 Dysphagia, unspecified: Secondary | ICD-10-CM | POA: Diagnosis not present

## 2017-01-12 DIAGNOSIS — I10 Essential (primary) hypertension: Secondary | ICD-10-CM | POA: Diagnosis not present

## 2017-01-12 DIAGNOSIS — I951 Orthostatic hypotension: Secondary | ICD-10-CM | POA: Diagnosis not present

## 2017-01-13 ENCOUNTER — Other Ambulatory Visit: Payer: Self-pay | Admitting: Neurology

## 2017-01-14 ENCOUNTER — Encounter: Payer: Self-pay | Admitting: Neurology

## 2017-01-15 ENCOUNTER — Telehealth: Payer: Self-pay | Admitting: Neurology

## 2017-01-15 DIAGNOSIS — R1310 Dysphagia, unspecified: Secondary | ICD-10-CM | POA: Diagnosis not present

## 2017-01-15 DIAGNOSIS — I10 Essential (primary) hypertension: Secondary | ICD-10-CM | POA: Diagnosis not present

## 2017-01-15 DIAGNOSIS — G3183 Dementia with Lewy bodies: Secondary | ICD-10-CM | POA: Diagnosis not present

## 2017-01-15 DIAGNOSIS — I951 Orthostatic hypotension: Secondary | ICD-10-CM | POA: Diagnosis not present

## 2017-01-15 DIAGNOSIS — N401 Enlarged prostate with lower urinary tract symptoms: Secondary | ICD-10-CM | POA: Diagnosis not present

## 2017-01-15 DIAGNOSIS — G20 Parkinson's disease: Secondary | ICD-10-CM | POA: Diagnosis not present

## 2017-01-15 NOTE — Telephone Encounter (Signed)
The family has made the decision to move the patient to skilled care, the patient will not be able to get his Sinemet on the current schedule, we need to simplify the regimen of Sinemet.  I have discussed this issue with Dr. Earlie Counts.  We will stop the Rytary.  We will take the patient off of the Sinemet 25/250 mg, the patient will be switched to the 50/200 mg CR tablet taking 1 tablet 4 times daily.  Patient will take one half of a Sinemet 25/100 mg IR tablet 4 times a day with the Sinemet CR.  He will remain on his current dose of the Mirapex, lowering the dose resulted in severe leg cramps.  The patient currently is on Seroquel getting 25 mg at night, this is allowing for good sleep, improving agitation and hallucinations.  The orthostatic hypotension has not been as severe recently.

## 2017-01-15 NOTE — Telephone Encounter (Signed)
Pt is needing a call to clarify the medication update.

## 2017-01-15 NOTE — Telephone Encounter (Signed)
Pt daughter(on DPR from 11-04-2013) has called and is asking for a call back as soon as possible re: confirmation as to how it was instructed to Hospice as to how pt is to be given medications, Please call Beth @ [phone]

## 2017-01-15 NOTE — Telephone Encounter (Signed)
[name], there is an email from patient's daughter as well. It seems there is a lot of confusion about his medications.

## 2017-01-15 NOTE — Telephone Encounter (Signed)
I have called the daughter.  We went over the medication regimen.  The regimen will be as follows:  The patient will stop Rytary and selegiline.  He will stop the 25/250 mg Sinemet tablets and switch to Sinemet CR 50/200 mg tablets taking 1 tablet 4 times daily.  He will have the 25/100 mg Sinemet tablets, 1/2 tablet 4 times daily with the Sinemet CR.  He will remain on Mirapex 0.75 mg 3 times daily.  He will continue the low-dose Seroquel and Ativan for agitation.

## 2017-01-16 DIAGNOSIS — G20 Parkinson's disease: Secondary | ICD-10-CM | POA: Diagnosis not present

## 2017-01-16 DIAGNOSIS — I10 Essential (primary) hypertension: Secondary | ICD-10-CM | POA: Diagnosis not present

## 2017-01-16 DIAGNOSIS — N401 Enlarged prostate with lower urinary tract symptoms: Secondary | ICD-10-CM | POA: Diagnosis not present

## 2017-01-16 DIAGNOSIS — G3183 Dementia with Lewy bodies: Secondary | ICD-10-CM | POA: Diagnosis not present

## 2017-01-16 DIAGNOSIS — R1310 Dysphagia, unspecified: Secondary | ICD-10-CM | POA: Diagnosis not present

## 2017-01-16 DIAGNOSIS — I951 Orthostatic hypotension: Secondary | ICD-10-CM | POA: Diagnosis not present

## 2017-01-18 DIAGNOSIS — I10 Essential (primary) hypertension: Secondary | ICD-10-CM | POA: Diagnosis not present

## 2017-01-18 DIAGNOSIS — I951 Orthostatic hypotension: Secondary | ICD-10-CM | POA: Diagnosis not present

## 2017-01-18 DIAGNOSIS — G3183 Dementia with Lewy bodies: Secondary | ICD-10-CM | POA: Diagnosis not present

## 2017-01-18 DIAGNOSIS — G20 Parkinson's disease: Secondary | ICD-10-CM | POA: Diagnosis not present

## 2017-01-18 DIAGNOSIS — N401 Enlarged prostate with lower urinary tract symptoms: Secondary | ICD-10-CM | POA: Diagnosis not present

## 2017-01-18 DIAGNOSIS — R1310 Dysphagia, unspecified: Secondary | ICD-10-CM | POA: Diagnosis not present

## 2017-01-19 ENCOUNTER — Encounter: Payer: Self-pay | Admitting: Internal Medicine

## 2017-01-19 ENCOUNTER — Non-Acute Institutional Stay (SKILLED_NURSING_FACILITY): Admitting: Internal Medicine

## 2017-01-19 DIAGNOSIS — G909 Disorder of the autonomic nervous system, unspecified: Secondary | ICD-10-CM

## 2017-01-19 DIAGNOSIS — J9611 Chronic respiratory failure with hypoxia: Secondary | ICD-10-CM

## 2017-01-19 DIAGNOSIS — Z9181 History of falling: Secondary | ICD-10-CM | POA: Diagnosis not present

## 2017-01-19 DIAGNOSIS — I270 Primary pulmonary hypertension: Secondary | ICD-10-CM

## 2017-01-19 DIAGNOSIS — R634 Abnormal weight loss: Secondary | ICD-10-CM | POA: Diagnosis not present

## 2017-01-19 DIAGNOSIS — H532 Diplopia: Secondary | ICD-10-CM | POA: Diagnosis not present

## 2017-01-19 DIAGNOSIS — R1310 Dysphagia, unspecified: Secondary | ICD-10-CM | POA: Diagnosis not present

## 2017-01-19 DIAGNOSIS — G20 Parkinson's disease: Secondary | ICD-10-CM

## 2017-01-19 DIAGNOSIS — I10 Essential (primary) hypertension: Secondary | ICD-10-CM | POA: Diagnosis not present

## 2017-01-19 DIAGNOSIS — R3915 Urgency of urination: Secondary | ICD-10-CM | POA: Diagnosis not present

## 2017-01-19 DIAGNOSIS — G3183 Dementia with Lewy bodies: Secondary | ICD-10-CM | POA: Diagnosis not present

## 2017-01-19 DIAGNOSIS — K5901 Slow transit constipation: Secondary | ICD-10-CM | POA: Diagnosis not present

## 2017-01-19 DIAGNOSIS — G20A1 Parkinson's disease without dyskinesia, without mention of fluctuations: Secondary | ICD-10-CM

## 2017-01-19 DIAGNOSIS — I951 Orthostatic hypotension: Secondary | ICD-10-CM | POA: Diagnosis not present

## 2017-01-19 DIAGNOSIS — N401 Enlarged prostate with lower urinary tract symptoms: Secondary | ICD-10-CM | POA: Diagnosis not present

## 2017-01-19 NOTE — Progress Notes (Signed)
Provider:  Rexene Edison. Mariea Clonts, D.O., C.M.D. Location:  [place] Room Number: 13 Place of Service:  SNF (31)  PCP: Blanchie Serve, MD Patient Care Team: Blanchie Serve, MD as PCP - General (Internal Medicine) Irine Seal, MD as Attending Physician (Urology) Charlotte Crumb, MD as Consulting Physician (Orthopedic Surgery)  Extended Emergency Contact Information Primary Emergency Contact: Jacko,Norma Address: [address]          [place], [place] [address] [place] Phone: [phone] Relation: Spouse Secondary Emergency Contact: [name],Lois  [place] [employer] [place] Phone: [phone] Relation: Daughter  Code Status: DNR, hospice care Goals of Care: Advanced Directive information Advanced Directives 01/22/2017  Does Patient Have a Medical Advance Directive? Yes  Type of Advance Directive Out of facility DNR (pink MOST or yellow form);[place];Living will  Does patient want to make changes to medical advance directive? -  Copy of [place] in Chart? No - copy requested  Would patient like information on creating a medical advance directive? -  Pre-existing out of facility DNR order (yellow form or pink MOST form) Yellow form placed in chart (order not valid for inpatient use)   Chief Complaint  Patient presents with  . New Admit To SNF    admit to SNF--also has hospice orders    HPI: Patient is a 84 y.o. male with h/o Parkinson's disease, orthostatic hypotension, chronic fatigue, falls, low back pain, dementia with apraxias, BPH, and osteoporosis seen today for admission to [facility] SNF from independent living.  He arrived here yesterday.  He has been on hospice care with [facility] for his Parkinson's and will continue to be followed by hospice here in SNF.  It appears he was last seen by [facility], Dr. Nyoka Cowden, on another SNF admission in 2016.  He's been following with neurology, [name], but  last visit there was October of 2018 (at that time, still ambulating with a walker but having more difficulty with leaning backwards, and already admitted to hospice care by that point).  There have been several phone calls and pt emails from pt's daughter about medication adjustments over the past week.  Late Nov, he was having difficulty with poor sleep, hallucinations, and confusion improved with seroquel, but depression seemed worse so he was changed to hydroxyzine.  He worsened with that change and seroquel and low dose ativan were then used.  There were concerns that he had more dyskinesias also on 1/2 and plans to more to SNF.  He was missing morning sinemet.  Mirapex was continued, rytary at hs discontinued, changed to long-acting sinemet qid along with 1/2 tab of short-acting sinemet with each CR tablet.    Seroquel was continued. Selegiline also was stopped.    PCP was Dr. Felipa Eth prior to his move to SNF, but it appears he was primarily seeing [name].    Functional status-requiring assistance with ADLs.  He's able to walk short distances with his walker and another person, but becomes short of breath.   When seen today, I met with him and his daughter, Eustaquio Maize, who was visiting.  We reviewed his history:  He's had Parkinson's diagnosis for at least 14 years.  He's been followed by neurology, [name].  His medication was adjusted recently as above.  I clarified the sinemet being given in his chart to match the recommendations by [name] in epic.  He has lives at [facility] independent living since 2007. For the  past 3 years, he and his wife have had 14 hrs of aide services from 7:30am to 10pm. Now this will change to home care for him 8-3 and 3-5pm working with his wife to fix up their [place] apt.    When asked how he felt, he said depressed and foggy.  He did not want to leave his wife to move to SNF.    There have been several concerns with his decline as of late:  Weight loss:  Down 10 lbs  April to Dec of this year.  Was on boost daily and gatorade each am at home and they want this continued here.  Appetite decreased over past 3 mos and is now 137 lbs.  Vision has been an issue with diplopia--has prisms in his glasses and followed by ophtho--he still feels he does not see well.  Dysphagia primarily with pills--requests regular diet though and pills with applesauce.  Soft diet will not work b/c he likes steaks like he had last night (per his daughter).  Constipation--takes two prunes, two colace at hs and on amtiza bid which had been ordered daily and was adjusted.  Last BM was last night.    Last fall was the last week of June when he was leaving his bathroom--got a concussion from it.  Uses walker.    Only pain he reports is in his low back near his waist and is relieved with tylenol occasionally.  Pulmonary htn/chronic respiratory failure with hypoxia: pt with severe clubbing of his digits.  It seems this was only noted over the past few years.  He had just O2 when at rest, but it may be necessary all of the time.  He's had some difficulty with infections of his digits per Jackelyn Poling from hospice and she's had to treat him even with oral abx to clear it up.  He is not c/o pain in his fingers, but has difficulty using them to button his shirts or do any small hand coordination (also with tremor from PD).  He has some urgency of urination, but rarely incontinent, I'm told.  Past Medical History:  Diagnosis Date  . Benign enlargement of prostate   . Chronic constipation 02/26/2014  . Contusion of forehead 03/09/2014  . Depression   . Dizziness   . Fracture, rib   . Gait disturbance   . History of fall 03/09/2014  . Hypertension   . Memory difficulties 07/20/2014  . Memory loss   . Orthostatic hypotension   . Osteoporosis   . Parkinson disease [facility])    Past Surgical History:  Procedure Laterality Date  . APPENDECTOMY    . CATARACT EXTRACTION Left   . HERNIA REPAIR    .  TONSILLECTOMY      Social History   Socioeconomic History  . Marital status: Married    Spouse name: None  . Number of children: 2  . Years of education: College +  . Highest education level: None  Social Needs  . Financial resource strain: None  . Food insecurity - worry: None  . Food insecurity - inability: None  . Transportation needs - medical: None  . Transportation needs - non-medical: None  Occupational History  . Occupation: Retired Mining engineer: RETIRED  Tobacco Use  . Smoking status: Never Smoker  . Smokeless tobacco: Never Used  Substance and Sexual Activity  . Alcohol use: No  . Drug use: No  . Sexual activity: None  Other Topics Concern  . None  Social History Narrative   Patient is right handed.   Patient drinks 1/2 cup caffeine daily.    reports that  has never smoked. he has never used smokeless tobacco. He reports that he does not drink alcohol or use drugs.  Functional Status Survey:    Family History  Problem Relation Age of Onset  . Cancer Mother   . Heart failure Father   . Breast cancer Daughter     Health Maintenance  Topic Date Due  . TETANUS/TDAP  07/05/2026  . INFLUENZA VACCINE  Completed  . PNA vac Low Risk Adult  Completed    Allergies  Allergen Reactions  . Myrbetriq [Mirabegron] Nausea And Vomiting  . Stalevo [Carbidopa-Levodopa-Entacapone] Diarrhea    GI upset    Outpatient Encounter Medications as of 01/19/2017  Medication Sig  . acetaminophen (TYLENOL) 500 MG tablet Take 500 mg by mouth 3 (three) times daily.  . AMITIZA 24 MCG capsule TAKE 1 CAPSULE TWICE DAILY WITH MEALS.  Marland Kitchen aspirin EC 81 MG tablet Take 81 mg by mouth at bedtime.  . carbamide peroxide (DEBROX) 6.5 % OTIC solution Place 5-10 drops into both ears daily as needed.  . carbidopa-levodopa (SINEMET CR) 50-200 MG tablet Take 1 tablet by mouth 4 (four) times daily.  Marland Kitchen docusate sodium (COLACE) 100 MG capsule Take 200 mg by mouth at bedtime.   Marland Kitchen  guaiFENesin (MUCINEX) 600 MG 12 hr tablet Take 1 tablet (600 mg total) by mouth 2 (two) times daily as needed.  Marland Kitchen LORazepam (ATIVAN) 0.5 MG tablet Take 0.5 mg by mouth at bedtime.  Marland Kitchen LORazepam (ATIVAN) 0.5 MG tablet Take 0.5 mg by mouth every 4 (four) hours as needed for anxiety.  . pramipexole (MIRAPEX) 0.75 MG tablet TAKE 1 TABLET BY MOUTH 3 TIMES A DAY.  . Probiotic Product (ALIGN PO) Take 1 capsule by mouth daily.  Marland Kitchen pyridostigmine (MESTINON) 60 MG tablet Take 0.5 tablets (30 mg total) by mouth every morning.  . ranitidine (ZANTAC) 75 MG tablet Take 75 mg by mouth daily as needed for heartburn (reflux).   . triamcinolone (NASACORT) 55 MCG/ACT AERO nasal inhaler Place 2 sprays into the nose as needed (allergies).  . [DISCONTINUED] Carbidopa-Levodopa ER (SINEMET CR) 25-100 MG tablet controlled release Take 0.5 tablets by mouth 4 (four) times daily.  . [DISCONTINUED] loratadine (CLARITIN) 10 MG tablet Take 10 mg by mouth daily.  . [DISCONTINUED] vitamin B-12 (CYANOCOBALAMIN) 500 MCG tablet Take 500 mcg by mouth every morning.   . [DISCONTINUED] vitamin D, CHOLECALCIFEROL, 400 UNITS tablet Take 400 Units by mouth every morning.  . carbidopa-levodopa (SINEMET) 25-100 MG tablet Take 0.5 tablets by mouth 4 (four) times daily.  . [DISCONTINUED] carbidopa-levodopa (SINEMET IR) 25-100 MG tablet TAKE 1 TABLET 3 TIMES A DAY. (Patient taking differently: Take 0.5-1 tablets 3 times daily; 1 tab at 10a, 0.5 tab at 3p, 0.5 tab at 7p)  . [DISCONTINUED] carbidopa-levodopa (SINEMET IR) 25-250 MG tablet TAKE  (1)  TABLET  FOUR TIMES DAILY. (Patient taking differently: Take 1 tablet 3 times daily: takes at 7:30a, 12:30p, 4:30p)  . [DISCONTINUED] hydrOXYzine (ATARAX/VISTARIL) 10 MG tablet Take 1 tablet (10 mg total) by mouth at bedtime.  . [DISCONTINUED] RYTARY 48.75-195 MG CPCR TAKE 1 CAPSULE DAILY AFTER SUPPER. (Patient taking differently: Take 1 capsule daily at bedtime (9p))  . [DISCONTINUED] selegiline  (ELDEPRYL) 5 MG capsule TAKE 1 CAPSULE TWICE DAILY WITH BREAKFAST AND LUNCH.   No facility-administered encounter medications on file as of 01/19/2017.  Review of Systems  Constitutional: Positive for malaise/fatigue. Negative for chills and fever.  HENT: Positive for hearing loss.        Rhinitis  Eyes: Positive for double vision.  Respiratory: Positive for shortness of breath. Negative for cough, sputum production and wheezing.   Cardiovascular: Negative for chest pain, palpitations and leg swelling.  Gastrointestinal: Positive for constipation. Negative for abdominal pain, blood in stool, diarrhea, heartburn, melena, nausea and vomiting.  Genitourinary: Negative for dysuria.  Musculoskeletal: Positive for back pain and falls.  Skin: Negative for itching and rash.       Scaly red skin of clubbed fingers   Neurological: Positive for tremors and weakness. Negative for dizziness and loss of consciousness.  Endo/Heme/Allergies: Does not bruise/bleed easily.  Psychiatric/Behavioral: Positive for depression and memory loss. The patient is not nervous/anxious and does not have insomnia.        Memory was worse on previous sinemet regimen per daughter, but he does have cognitive loss (?role of hypoxia, also)    There were no vitals filed for this visit. There is no height or weight on file to calculate BMI. Physical Exam  Constitutional: He is oriented to person, place, and time.  Frail appearing white male seated in chair in his room, wearing O2 at 2L [place]  HENT:  Head: Normocephalic and atraumatic.  Right Ear: External ear normal.  Left Ear: External ear normal.  Nose: Nose normal.  Mouth/Throat: Oropharynx is clear and moist. No oropharyngeal exudate.  Eyes: Conjunctivae and EOM are normal. Pupils are equal, round, and reactive to light.  Glasses with prisms  Neck: Normal range of motion. Neck supple. No JVD present.  Cardiovascular: Normal rate, regular rhythm, normal heart sounds  and intact distal pulses.  Pulmonary/Chest: Effort normal and breath sounds normal. No respiratory distress.  Abdominal: Soft. Bowel sounds are normal. He exhibits no distension. There is no tenderness.  Musculoskeletal: Normal range of motion. He exhibits no tenderness.  Lymphadenopathy:    He has no cervical adenopathy.  Neurological: He is alert and oriented to person, place, and time.  But poor historian, slow to respond, masked facies, resting tremor and cogwheel rigidity of extremities, left worse than right  Skin: Skin is warm and dry.  Dry, scaly skin around nail beds, erythematous, advanced clubbing of fingers  Psychiatric: He has a normal mood and affect.    Labs reviewed: Basic Metabolic Panel: Recent Labs    07/04/16 1038 07/05/16 0237  NA 133* 134*  K 4.0 4.0  CL 99* 104  CO2 27 24  GLUCOSE 119* 87  BUN 19 17  CREATININE 0.89 0.80  CALCIUM 8.5* 7.9*   Liver Function Tests: No results for input(s): AST, ALT, ALKPHOS, BILITOT, PROT, ALBUMIN in the last 8760 hours. No results for input(s): LIPASE, AMYLASE in the last 8760 hours. No results for input(s): AMMONIA in the last 8760 hours. CBC: Recent Labs    07/04/16 1038 07/05/16 0237  WBC 7.1 6.2  NEUTROABS 5.6  --   HGB 11.1* 10.6*  HCT 33.1* 31.1*  MCV 86.9 85.9  PLT 230 205   Cardiac Enzymes: Recent Labs    07/04/16 1441 07/04/16 1951 07/05/16 0237  TROPONINI <0.03 <0.03 <0.03   BNP: Invalid input(s): POCBNP Lab Results  Component Value Date   HGBA1C 5.6 07/04/2016   Lab Results  Component Value Date   TSH 1.225 07/04/2016   Lab Results  Component Value Date   VITAMINB12 1,116 (H) 03/23/2014   Lab Results  Component Value Date   FOLATE >20.0 03/23/2014   No results found for: IRON, TIBC, FERRITIN  Imaging and Procedures obtained prior to SNF admission: Ct Head Wo Contrast  Result Date: 07/20/2016 [facility] NEUROIMAGING REPORT STUDY DATE: 07/18/16 PATIENT NAME:  VANESSA ALESI DOB: 12/03/1929 MRN: [id number] ORDERING CLINICIAN: Kathrynn Ducking, MD CLINICAL HISTORY: 84 year old male with concussion and confusion. EXAM: CT head (without) TECHNIQUE: CT scan of the head was obtained utilizing 5 mm axial slices from the skull base to the vertex. CONTRAST: no COMPARISON: 07/04/16 CT head IMAGING SITE: [facility] FINDINGS: The cortical sulci, fissures and cisterns are notable for moderate-severe atrophy, primarily in the mesial temporal region. Lateral, third and fourth ventricle are moderately enlarged. No extra-axial fluid collections are seen.  No intracranial hemorrhage.  No evidence of mass effect or midline shift.  Moderate periventricular and subcortical chronic small vessel ischemic disease. The orbits and their contents, paranasal sinuses and calvarium are unremarkable.   Abnormal CT head (without) demonstrating: 1. Moderate-severe atrophy, primarily in the mesial temporal region. 2. Moderate ventriculomegaly on ex vacuo basis. 3. Moderate periventricular and subcortical chronic small vessel ischemic disease. 4. No acute findings. 5. No major new changes compared to CT on 07/04/16. The right scalp hematoma has reduced in size. INTERPRETING PHYSICIAN: Penni Bombard, MD Certified in Neurology, Neurophysiology and Neuroimaging [facility] [address], [place],  [address] [phone]    Assessment/Plan 1. Parkinson disease ([place]) - clarified sinemet dosing with CR and IR tablets as per neurology--list from family showed CR with 1/2 tab of IR and there are numerous clarifications in the chart -it appears that it was entered as CR with 1/2 tab CR but CR cannot be cut--clarification was written on 1/11 when I saw the patient and given directly to the nurse--I just fixed it again in epic -I did not order therapy for him as he is on hospice care at this time with comfort goals from family  perspective -also cont mirapex  2. Primary pulmonary hypertension (HCC) -continue oxygen therapy and monitoring of his clubbed fingers, but pt not bothered by them at this time  3. Chronic respiratory failure with hypoxia (HCC) -cont oxygen therapy  4. Autonomic dysfunction -noted, cont his mestinon each am, use of walker and increased assistance in snf  5. Slow transit constipation -cont bid amitiza, 2 prunes and 2 colace which have been effective, encourage hydration but watchfor hyponatremia which he's had in the past  6. Urgency of urination -notable, pt denies much incontinence, but not certain how accurate this is at this point, will need to observe now that in snf  7. History of fall -last was in June per family, cont walker use and staff close monitoring  8. Diplopia -cont prism glasses, f/u ophthalmology  9. Pill dysphagia -cont pills with applesauce, change diet to regular per pt request for quality of life  10. Weight loss -resume home boost supplements, encourage po intake  11. Advance care planning Last Advance Care Planning (ACP) Note    Gayland Curry, DO (Physician) 01/28/2017 12:12  Geriatrics         Met with pt, daughter, Eustaquio Maize in his room.    Goals for patient:  To return home with his wife in independent living, but also said he did want to be comfortable and have a good quality of life.  Spoke with pt, daughter and got the impression that he was  unaware that the move to SNF was meant to be permanent.  Unfortunately, per the social worker, this was true and she noted that pt and wife would not agree to the move unless it was presented by the family as temporary to "get him stronger".  Meanwhile, he is on hospice care for his Parkinson's disease and pulmonary hypertension with chronic intermittently hypoxic respiratory failure.  Part of good QOL for him is to be able to eat what he likes with a regular diet even if this means he's at risk for aspiration.  He  requires his pills to be given with applesauce to help him swallow them.     ACP documents:  DNR form, living will and HCPOA.  NOTE THAT A MOST FORM WAS COMPLETED IN THE PAST, BUT WAS NOT VALID B/C PT NOR POA HAD SIGNED IT AND THERE WAS WHITE-OUT ALL OVER IT.  HIS PRIMARY HCPOA WAS NOT PRESENT TODAY SO I RECOMMENDED FOR THE SOCIAL WORKER TO ARRANGE WITH HOSPICE TO GET A NEW MOST COMPLETED THAT WOULD BE VALID.    Hospice care with [facility].  Nurse here today to see him, Buel Ream, RN.    Discussed that we would do our best to meet his goals of good quality of life and comfort.  60 minutes were spent on advance care planning.      Original note by Gayland Curry, DO at 01/28/2017 11:35          Given goals of care, will decrease pill burden by d/c vitamin D, b12, claritin--rhinorrhea is part of PD and this med will only dry him out and put him at risk for urinary retention, worsened constipation, etc.   Family/ staff Communication: met with pt and his daughter, Eustaquio Maize, for 2 hours total, 60 mins on ACP, and 45  mins reviewing his chronic conditions and history for SNF admission  Labs/tests ordered:  None; needs updated MOST--social work discussing with hospice  Zarianna Dicarlo L. Gwendolin Briel, D.O. [facility] [address], [place] [address] Cell Phone (Mon-Fri 8am-5pm):  [phone] On Call:  [phone] & follow prompts after 5pm & weekends Office Phone:  [phone] Office Fax:  [phone]

## 2017-01-19 NOTE — ACP (Advance Care Planning) (Addendum)
Met with pt, daughter, Eustaquio Maize in his room.    Goals for patient:  To return home with his wife in independent living, but also said he did want to be comfortable and have a good quality of life.  Spoke with pt, daughter and got the impression that he was unaware that the move to SNF was meant to be permanent.  Unfortunately, per the social worker, this was true and she noted that pt and wife would not agree to the move unless it was presented by the family as temporary to "get him stronger".  Meanwhile, he is on hospice care for his Parkinson's disease and pulmonary hypertension with chronic intermittently hypoxic respiratory failure.  Part of good QOL for him is to be able to eat what he likes with a regular diet even if this means he's at risk for aspiration.  He requires his pills to be given with applesauce to help him swallow them.     ACP documents:  DNR form, living will and HCPOA.  NOTE THAT A MOST FORM WAS COMPLETED IN THE PAST, BUT WAS NOT VALID B/C PT NOR POA HAD SIGNED IT AND THERE WAS WHITE-OUT ALL OVER IT.  HIS PRIMARY HCPOA WAS NOT PRESENT TODAY SO I RECOMMENDED FOR THE SOCIAL WORKER TO ARRANGE WITH HOSPICE TO GET A NEW MOST COMPLETED THAT WOULD BE VALID.    Hospice care with [facility].  Nurse here today to see him, Buel Ream, RN.    Discussed that we would do our best to meet his goals of good quality of life and comfort.  60 minutes were spent on advance care planning.

## 2017-01-22 ENCOUNTER — Non-Acute Institutional Stay (SKILLED_NURSING_FACILITY): Payer: Medicare Other | Admitting: Family

## 2017-01-22 ENCOUNTER — Encounter: Payer: Self-pay | Admitting: Family

## 2017-01-22 DIAGNOSIS — L03019 Cellulitis of unspecified finger: Secondary | ICD-10-CM | POA: Diagnosis not present

## 2017-01-22 DIAGNOSIS — R683 Clubbing of fingers: Secondary | ICD-10-CM

## 2017-01-22 MED ORDER — DOXYCYCLINE HYCLATE 100 MG PO TABS: 100.0000 mg | ORAL_TABLET | Freq: Two times a day (BID) | ORAL | 0 refills | Status: AC

## 2017-01-22 NOTE — Progress Notes (Signed)
Location:  [facility] Room Number: 13 Place of Service:  SNF (31) Provider: Koralyn Prestage FNP-C  Blanchie Serve, MD  Patient Care Team: Blanchie Serve, MD as PCP - General (Internal Medicine) Irine Seal, MD as Attending Physician (Urology) Charlotte Crumb, MD as Consulting Physician (Orthopedic Surgery)  Extended Emergency Contact Information Primary Emergency Contact: Neuzil,Norma Address: [address]          [place], [place] [address] [place] Phone: [phone] Relation: Spouse Secondary Emergency Contact: [name],Lois  [place] Phone: [phone] Relation: Daughter  Code Status:  DNR Goals of care: Advanced Directive information Advanced Directives 01/22/2017  Does Patient Have a Medical Advance Directive? Yes  Type of Advance Directive Out of facility DNR (pink MOST or yellow form);[place];Living will  Does patient want to make changes to medical advance directive? -  Copy of [place] in Chart? No - copy requested  Would patient like information on creating a medical advance directive? -  Pre-existing out of facility DNR order (yellow form or pink MOST form) Yellow form placed in chart (order not valid for inpatient use)     Chief Complaint  Patient presents with  . Acute Visit    Check crusting around nail beds; also eval need for Pneumovax booster    HPI:  Pt is a 84 y.o. male seen today at [facility] for an acute visit for evaluation of "crusting around nail beds".Facility Nurse also request evaluation for Pneumovax booster vaccine.He is seen in his room today with wife and seater at bedside.Facility Nurse reports patient's POA requested evaluation of crusting around the nail beds.Per POA patient has had clubbed fingers with crusting for 3-4 months.Patient complains of pain finger nails are touched.He reports drainage from nail beds.he denies  any fever or chills.He recently moved from independent living to [facility] facility.He is on Hospice service.Nurse inquires of pneumovax booster vaccine for pneumonia prophylaxis per facility admission orders.Facility Nurse spoke with Hospice service vaccine not covered but may administered if patient able to meet cost. Nurse will contact patient's POA.        Past Medical History:  Diagnosis Date  . Benign enlargement of prostate   . Chronic constipation 02/26/2014  . Contusion of forehead 03/09/2014  . Depression   . Dizziness   . Fracture, rib   . Gait disturbance   . History of fall 03/09/2014  . Hypertension   . Memory difficulties 07/20/2014  . Memory loss   . Orthostatic hypotension   . Osteoporosis   . Parkinson disease [facility])    Past Surgical History:  Procedure Laterality Date  . APPENDECTOMY    . CATARACT EXTRACTION Left   . HERNIA REPAIR    . TONSILLECTOMY      Allergies  Allergen Reactions  . Myrbetriq [Mirabegron] Nausea And Vomiting  . Stalevo [Carbidopa-Levodopa-Entacapone] Diarrhea    GI upset    Outpatient Encounter Medications as of 01/22/2017  Medication Sig  . acetaminophen (TYLENOL) 500 MG tablet Take 500 mg by mouth 3 (three) times daily.  . AMITIZA 24 MCG capsule TAKE 1 CAPSULE TWICE DAILY WITH MEALS.  Marland Kitchen aspirin EC 81 MG tablet Take 81 mg by mouth at bedtime.  . carbamide peroxide (DEBROX) 6.5 % OTIC solution Place 5-10 drops into both ears daily as needed.  . carbidopa-levodopa (SINEMET CR) 50-200 MG tablet Take 1 tablet by mouth 4 (four) times daily.  . Carbidopa-Levodopa  ER (SINEMET CR) 25-100 MG tablet controlled release Take 0.5 tablets by mouth 4 (four) times daily.  Marland Kitchen docusate sodium (COLACE) 100 MG capsule Take 200 mg by mouth at bedtime.   Marland Kitchen guaiFENesin (MUCINEX) 600 MG 12 hr tablet Take 1 tablet (600 mg total) by mouth 2 (two) times daily as needed.  . loratadine (CLARITIN) 10 MG tablet Take 10 mg by mouth daily.  Marland Kitchen LORazepam (ATIVAN)  0.5 MG tablet Take 0.5 mg by mouth at bedtime.  Marland Kitchen LORazepam (ATIVAN) 0.5 MG tablet Take 0.5 mg by mouth every 4 (four) hours as needed for anxiety.  . pramipexole (MIRAPEX) 0.75 MG tablet TAKE 1 TABLET BY MOUTH 3 TIMES A DAY.  . Probiotic Product (ALIGN PO) Take 1 capsule by mouth daily.  Marland Kitchen pyridostigmine (MESTINON) 60 MG tablet Take 0.5 tablets (30 mg total) by mouth every morning.  . ranitidine (ZANTAC) 75 MG tablet Take 75 mg by mouth daily as needed for heartburn (reflux).   . triamcinolone (NASACORT) 55 MCG/ACT AERO nasal inhaler Place 2 sprays into the nose as needed (allergies).  . vitamin B-12 (CYANOCOBALAMIN) 500 MCG tablet Take 500 mcg by mouth every morning.   . vitamin D, CHOLECALCIFEROL, 400 UNITS tablet Take 400 Units by mouth every morning.  Marland Kitchen doxycycline (VIBRA-TABS) 100 MG tablet Take 1 tablet (100 mg total) by mouth 2 (two) times daily for 7 days.   No facility-administered encounter medications on file as of 01/22/2017.     Review of Systems  Constitutional: Negative for chills, fatigue and fever.  Respiratory: Negative for cough, chest tightness and wheezing.        On continuous oxygen via nasal cannula  Cardiovascular: Negative for chest pain, palpitations and leg swelling.       Chronic clubbed finger nails  Gastrointestinal: Negative for abdominal distention, abdominal pain, constipation, diarrhea, nausea and vomiting.  Endocrine: Negative for cold intolerance, heat intolerance, polydipsia, polyphagia and polyuria.  Musculoskeletal: Positive for gait problem.  Skin: Negative for pallor and rash.       Crusting of nail beds with drainage for several months.Spouce states soaked nails on Epsom salt.  Hematological: Does not bruise/bleed easily.  Psychiatric/Behavioral: Negative for agitation, confusion, hallucinations and sleep disturbance.    Immunization History  Administered Date(s) Administered  . Influenza, High Dose Seasonal PF 10/16/2016  .  Influenza-Unspecified 10/11/2015  . Pneumococcal Conjugate-13 06/30/2013  . Pneumococcal Polysaccharide-23 02/25/2004  . Tdap 07/04/2016  . Zoster 10/17/2004   Pertinent  Health Maintenance Due  Topic Date Due  . INFLUENZA VACCINE  Completed  . PNA vac Low Risk Adult  Completed   Fall Risk  03/23/2016 03/13/2014  Falls in the past year? Yes Yes  Number falls in past yr: 1 2 or more  Injury with Fall? Yes Yes  Risk Factor Category  - High Fall Risk  Risk for fall due to : History of fall(s) History of fall(s);Impaired balance/gait;Impaired mobility;Other (Comment)  Risk for fall due to: Comment - Instability related to parkinsonism  Follow up - Falls evaluation completed;Education provided;Falls prevention discussed    Vitals:   01/22/17 1121  BP: 132/88  Pulse: 74  Resp: (!) 22  Temp: 97.7 F (36.5 C)  SpO2: 97%  Weight: 135 lb 9.6 oz (61.5 kg)  Height: 6' (1.829 m)   Body mass index is 18.39 kg/m. Physical Exam  Constitutional:  Frail elderly in no acute distress   HENT:  Head: Normocephalic.  Mouth/Throat: Oropharynx is clear and moist. No oropharyngeal  exudate.  Eyes: Conjunctivae are normal. Pupils are equal, round, and reactive to light. Right eye exhibits no discharge. Left eye exhibits no discharge. No scleral icterus.  Eye glasses in place   Neck: Normal range of motion. No JVD present. No thyromegaly present.  Cardiovascular: Normal rate, regular rhythm, normal heart sounds and intact distal pulses. Exam reveals no gallop and no friction rub.  No murmur heard. Pulmonary/Chest: Effort normal and breath sounds normal. No respiratory distress. He has no wheezes. He has no rales.  Oxygen 2 liters via nasal cannula in place  Abdominal: Soft. Bowel sounds are normal. He exhibits no distension. There is no tenderness. There is no rebound and no guarding.  Musculoskeletal: He exhibits no edema or tenderness.  Unsteady gait uses wheelchair with assist.     Lymphadenopathy:    He has no cervical adenopathy.  Neurological: Coordination normal.  Alert and oriented to person and place  Skin: Skin is warm and dry. No rash noted.  Bilateral thumb,index, middle and ring fingers proximal cuticle red,warn to touch,swollen with dry blood drainage. Right ring finger cuticle red,warm to touch,swollen with yellow pus drainage.Fingers very tender to touch.   Psychiatric: He has a normal mood and affect.   Labs reviewed: Recent Labs    07/04/16 1038 07/05/16 0237  NA 133* 134*  K 4.0 4.0  CL 99* 104  CO2 27 24  GLUCOSE 119* 87  BUN 19 17  CREATININE 0.89 0.80  CALCIUM 8.5* 7.9*    Recent Labs    07/04/16 1038 07/05/16 0237  WBC 7.1 6.2  NEUTROABS 5.6  --   HGB 11.1* 10.6*  HCT 33.1* 31.1*  MCV 86.9 85.9  PLT 230 205   Lab Results  Component Value Date   TSH 1.225 07/04/2016   Lab Results  Component Value Date   HGBA1C 5.6 07/04/2016    Significant Diagnostic Results in last 30 days:  No results found.  Assessment/Plan 1. Acute paronychia of finger Afebrile.Bilateral thumb,index, middle and ring fingers proximal cuticle red,warn to touch,swollen with dry blood drainage.Right ring finger cuticle red,warm to touch,swollen with yellow pus drainage.Fingers very tender to touch.Has used Epsom salt soaks.Soak both hands in warm water and a tablespoon of Epsom salt for 10 minutes daily x 7 days.Doxycycline 100 mg tablet twice daily x 7 days for cuticle cellulitis.florastor 250 mg capsule one by mouth twice daily x 10 days for diarrhea prophylaxis.continue to monitor.   2. Clubbed fingers Chronic possible due to chronic hypoxia.Chart reviewed no cardiac etiology. continue on continuous oxygen via nasal cannula.  Family/ staff Communication: Reviewed plan of care with patient and facility Nurse.  Labs/tests ordered: None   Joshuah Minella C Londa Mackowski, NP

## 2017-01-23 DIAGNOSIS — G20 Parkinson's disease: Secondary | ICD-10-CM | POA: Diagnosis not present

## 2017-01-23 DIAGNOSIS — N401 Enlarged prostate with lower urinary tract symptoms: Secondary | ICD-10-CM | POA: Diagnosis not present

## 2017-01-23 DIAGNOSIS — I10 Essential (primary) hypertension: Secondary | ICD-10-CM | POA: Diagnosis not present

## 2017-01-23 DIAGNOSIS — I951 Orthostatic hypotension: Secondary | ICD-10-CM | POA: Diagnosis not present

## 2017-01-23 DIAGNOSIS — G3183 Dementia with Lewy bodies: Secondary | ICD-10-CM | POA: Diagnosis not present

## 2017-01-23 DIAGNOSIS — R1310 Dysphagia, unspecified: Secondary | ICD-10-CM | POA: Diagnosis not present

## 2017-01-24 ENCOUNTER — Telehealth: Payer: Self-pay | Admitting: Family

## 2017-01-24 DIAGNOSIS — R1310 Dysphagia, unspecified: Secondary | ICD-10-CM | POA: Diagnosis not present

## 2017-01-24 DIAGNOSIS — N401 Enlarged prostate with lower urinary tract symptoms: Secondary | ICD-10-CM | POA: Diagnosis not present

## 2017-01-24 DIAGNOSIS — G20 Parkinson's disease: Secondary | ICD-10-CM | POA: Diagnosis not present

## 2017-01-24 DIAGNOSIS — G3183 Dementia with Lewy bodies: Secondary | ICD-10-CM | POA: Diagnosis not present

## 2017-01-24 DIAGNOSIS — I10 Essential (primary) hypertension: Secondary | ICD-10-CM | POA: Diagnosis not present

## 2017-01-24 DIAGNOSIS — I951 Orthostatic hypotension: Secondary | ICD-10-CM | POA: Diagnosis not present

## 2017-01-24 NOTE — Telephone Encounter (Signed)
Patient's POA Kathrynn Speed phone call returned.POA had concerns with patient recently starting on antibiotics Doxycycline 100 mg tablet twice daily for acute paronychia.she had requested antibiotics to be discontinued due to previous use without any effect and thought ABXs caused loss of appetite.importance of antibiotics discussed with patient's POA verbalized understanding.She would like Doxycycline restarted.Order written to restart antibiotics.

## 2017-01-25 DIAGNOSIS — G20 Parkinson's disease: Secondary | ICD-10-CM | POA: Diagnosis not present

## 2017-01-25 DIAGNOSIS — I10 Essential (primary) hypertension: Secondary | ICD-10-CM | POA: Diagnosis not present

## 2017-01-25 DIAGNOSIS — N401 Enlarged prostate with lower urinary tract symptoms: Secondary | ICD-10-CM | POA: Diagnosis not present

## 2017-01-25 DIAGNOSIS — I951 Orthostatic hypotension: Secondary | ICD-10-CM | POA: Diagnosis not present

## 2017-01-25 DIAGNOSIS — R1310 Dysphagia, unspecified: Secondary | ICD-10-CM | POA: Diagnosis not present

## 2017-01-25 DIAGNOSIS — G3183 Dementia with Lewy bodies: Secondary | ICD-10-CM | POA: Diagnosis not present

## 2017-01-28 ENCOUNTER — Encounter: Payer: Self-pay | Admitting: Internal Medicine

## 2017-01-28 DIAGNOSIS — I270 Primary pulmonary hypertension: Secondary | ICD-10-CM | POA: Insufficient documentation

## 2017-01-28 DIAGNOSIS — J9611 Chronic respiratory failure with hypoxia: Secondary | ICD-10-CM | POA: Insufficient documentation

## 2017-01-28 MED ORDER — CARBIDOPA-LEVODOPA 25-100 MG PO TABS: 0.5000 | ORAL_TABLET | Freq: Four times a day (QID) | ORAL | 5 refills | Status: DC

## 2017-01-30 DIAGNOSIS — G3183 Dementia with Lewy bodies: Secondary | ICD-10-CM | POA: Diagnosis not present

## 2017-01-30 DIAGNOSIS — N401 Enlarged prostate with lower urinary tract symptoms: Secondary | ICD-10-CM | POA: Diagnosis not present

## 2017-01-30 DIAGNOSIS — R1310 Dysphagia, unspecified: Secondary | ICD-10-CM | POA: Diagnosis not present

## 2017-01-30 DIAGNOSIS — I951 Orthostatic hypotension: Secondary | ICD-10-CM | POA: Diagnosis not present

## 2017-01-30 DIAGNOSIS — I10 Essential (primary) hypertension: Secondary | ICD-10-CM | POA: Diagnosis not present

## 2017-01-30 DIAGNOSIS — G20 Parkinson's disease: Secondary | ICD-10-CM | POA: Diagnosis not present

## 2017-01-31 ENCOUNTER — Non-Acute Institutional Stay (SKILLED_NURSING_FACILITY): Payer: Medicare Other

## 2017-01-31 ENCOUNTER — Telehealth: Payer: Self-pay | Admitting: *Deleted

## 2017-01-31 DIAGNOSIS — Z0000 Encounter for general adult medical examination without abnormal findings: Secondary | ICD-10-CM

## 2017-01-31 NOTE — Progress Notes (Signed)
Subjective:   Brian Aguirre is a 84 y.o. male who presents for Medicare Annual/Subsequent preventive examination at [facility]; incapacitated patient unable to answer questions appropriately, hospice pt       Objective:    Vitals: BP 115/60 (BP Location: Left Arm, Patient Position: Sitting)   Pulse 72   Temp 98.1 F (36.7 C) (Oral)   Ht 6' (1.829 m)   Wt 136 lb (61.7 kg)   SpO2 95%   BMI 18.44 kg/m   Body mass index is 18.44 kg/m.  Advanced Directives 01/31/2017 01/22/2017 01/19/2017 07/04/2016 03/16/2015 07/20/2014 07/06/2014  Does Patient Have a Medical Advance Directive? Yes Yes Yes Yes Yes Yes Yes  Type of Advance Directive Living will;Out of facility DNR (pink MOST or yellow form);Healthcare Power of [employer] of facility DNR (pink MOST or yellow form);[place];Living will Out of facility DNR (pink MOST or yellow form);[place];Living will [place];Living will - Living will;Healthcare Power of Attorney -  Does patient want to make changes to medical advance directive? No - Patient declined - - No - Patient declined - - -  Copy of [place] in Chart? No - copy requested No - copy requested No - copy requested No - copy requested No - copy requested - -  Would patient like information on creating a medical advance directive? - - - - - - -  Pre-existing out of facility DNR order (yellow form or pink MOST form) Yellow form placed in chart (order not valid for inpatient use) Yellow form placed in chart (order not valid for inpatient use) - - - - -    Tobacco Social History   Tobacco Use  Smoking Status Never Smoker  Smokeless Tobacco Never Used     Counseling given: Not Answered   Clinical Intake:  Pre-visit preparation completed: No  Pain : No/denies pain     Nutritional Status: BMI <19  Underweight Nutritional Risks: None Diabetes: No  How often do you need to have  someone help you when you read instructions, pamphlets, or other written materials from your doctor or pharmacy?: 3 - Sometimes  Interpreter Needed?: No  Information entered by :: Tyson Dense, RN  Past Medical History:  Diagnosis Date  . Benign enlargement of prostate   . Chronic constipation 02/26/2014  . Contusion of forehead 03/09/2014  . Depression   . Dizziness   . Fracture, rib   . Gait disturbance   . History of fall 03/09/2014  . Hypertension   . Memory difficulties 07/20/2014  . Memory loss   . Orthostatic hypotension   . Osteoporosis   . Parkinson disease [facility])    Past Surgical History:  Procedure Laterality Date  . APPENDECTOMY    . CATARACT EXTRACTION Left   . HERNIA REPAIR    . TONSILLECTOMY     Family History  Problem Relation Age of Onset  . Cancer Mother   . Heart failure Father   . Breast cancer Daughter    Social History   Socioeconomic History  . Marital status: Married    Spouse name: None  . Number of children: 2  . Years of education: College +  . Highest education level: None  Social Needs  . Financial resource strain: Not hard at all  . Food insecurity - worry: Never true  . Food insecurity - inability: Never true  . Transportation needs - medical: No  .  Transportation needs - non-medical: No  Occupational History  . Occupation: Retired Mining engineer: RETIRED  Tobacco Use  . Smoking status: Never Smoker  . Smokeless tobacco: Never Used  Substance and Sexual Activity  . Alcohol use: No  . Drug use: No  . Sexual activity: None  Other Topics Concern  . None  Social History Narrative   Patient is right handed.   Patient drinks 1/2 cup caffeine daily.    Outpatient Encounter Medications as of 01/31/2017  Medication Sig  . acetaminophen (TYLENOL) 500 MG tablet Take 500 mg by mouth 3 (three) times daily.  . AMITIZA 24 MCG capsule TAKE 1 CAPSULE TWICE DAILY WITH MEALS.  Marland Kitchen aspirin EC 81 MG tablet Take 81 mg by mouth at bedtime.   . carbamide peroxide (DEBROX) 6.5 % OTIC solution Place 5-10 drops into both ears daily as needed.  . carbidopa-levodopa (SINEMET CR) 50-200 MG tablet Take 1 tablet by mouth 4 (four) times daily.  . carbidopa-levodopa (SINEMET) 25-100 MG tablet Take 0.5 tablets by mouth 4 (four) times daily.  Marland Kitchen docusate sodium (COLACE) 100 MG capsule Take 200 mg by mouth at bedtime.   Marland Kitchen guaiFENesin (MUCINEX) 600 MG 12 hr tablet Take 1 tablet (600 mg total) by mouth 2 (two) times daily as needed.  Marland Kitchen LORazepam (ATIVAN) 0.5 MG tablet Take 0.5 mg by mouth at bedtime.  Marland Kitchen LORazepam (ATIVAN) 0.5 MG tablet Take 0.5 mg by mouth every 4 (four) hours as needed for anxiety.  . pramipexole (MIRAPEX) 0.75 MG tablet TAKE 1 TABLET BY MOUTH 3 TIMES A DAY.  . Probiotic Product (ALIGN PO) Take 1 capsule by mouth daily.  Marland Kitchen pyridostigmine (MESTINON) 60 MG tablet Take 0.5 tablets (30 mg total) by mouth every morning.  Marland Kitchen QUEtiapine (SEROQUEL) 25 MG tablet Take 25 mg by mouth at bedtime.  . ranitidine (ZANTAC) 75 MG tablet Take 75 mg by mouth daily as needed for heartburn (reflux).   . triamcinolone (NASACORT) 55 MCG/ACT AERO nasal inhaler Place 2 sprays into the nose as needed (allergies).   No facility-administered encounter medications on file as of 01/31/2017.     Activities of Daily Living In your present state of health, do you have any difficulty performing the following activities: 01/31/2017 07/04/2016  Hearing? Y Y  Comment - slight  Vision? N N  Difficulty concentrating or making decisions? Y N  Walking or climbing stairs? Y Y  Dressing or bathing? Y Y  Doing errands, shopping? Tempie Donning  Preparing Food and eating ? Y -  Using the Toilet? Y -  In the past six months, have you accidently leaked urine? Y -  Do you have problems with loss of bowel control? Y -  Managing your Medications? Y -  Managing your Finances? Y -  Housekeeping or managing your Housekeeping? Y -  Some recent data might be hidden    Patient Care  Team: Blanchie Serve, MD as PCP - General (Internal Medicine) Irine Seal, MD as Attending Physician (Urology) Charlotte Crumb, MD as Consulting Physician (Orthopedic Surgery)   Assessment:   This is a routine wellness examination for Karon.  Exercise Activities and Dietary recommendations Current Exercise Habits: The patient does not participate in regular exercise at present, Exercise limited by: orthopedic condition(s)  Goals    None      Fall Risk Fall Risk  01/31/2017 03/23/2016 03/13/2014  Falls in the past year? Yes Yes Yes  Number falls in past yr: 2 or more  1 2 or more  Injury with Fall? Yes Yes Yes  Risk Factor Category  - - High Fall Risk  Risk for fall due to : - History of fall(s) History of fall(s);Impaired balance/gait;Impaired mobility;Other (Comment)  Risk for fall due to: Comment - - Instability related to parkinsonism  Follow up - - Falls evaluation completed;Education provided;Falls prevention discussed   Is the patient's home free of loose throw rugs in walkways, pet beds, electrical cords, etc?   yes      Grab bars in the bathroom? yes      Handrails on the stairs?   yes      Adequate lighting?   yes  Timed Get Up and Go Performed: unable to perform, pt is unambulatory  Depression Screen PHQ 2/9 Scores 01/31/2017 03/13/2014  PHQ - 2 Score 6 0  PHQ- 9 Score 19 -    Cognitive Function MMSE - Mini Mental State Exam 01/31/2017 07/20/2014 06/23/2014  Orientation to time 4 4 4   Orientation to Place 4 5 5   Registration 3 3 3   Attention/ Calculation 4 2 3   Recall 0 2 2  Language- name 2 objects 2 2 2   Language- repeat 1 1 1   Language- follow 3 step command 3 3 3   Language- read & follow direction 1 1 1   Write a sentence 0 1 1  Copy design 0 0 0  Total score 22 24 25         Immunization History  Administered Date(s) Administered  . Influenza, High Dose Seasonal PF 10/16/2016  . Influenza-Unspecified 10/11/2015  . Pneumococcal Conjugate-13 06/30/2013    . Pneumococcal Polysaccharide-23 02/25/2004  . Tdap 07/04/2016  . Zoster 10/17/2004    Qualifies for Shingles Vaccine? No, pt is hospice  Screening Tests Health Maintenance  Topic Date Due  . TETANUS/TDAP  07/05/2026  . INFLUENZA VACCINE  Completed  . PNA vac Low Risk Adult  Completed   Cancer Screenings: Lung: Low Dose CT Chest recommended if Age 2-80 years, 30 pack-year currently smoking OR have quit w/in 15years. Patient does not qualify. Colorectal: up to date  Additional Screenings:  Hepatitis B/HIV/Syphillis: excluded, hospice pt Hepatitis C Screening: excluded, hospice pt    Plan:    I have personally reviewed and addressed the Medicare Annual Wellness questionnaire and have noted the following in the patient's chart:  A. Medical and social history B. Use of alcohol, tobacco or illicit drugs  C. Current medications and supplements D. Functional ability and status E.  Nutritional status F.  Physical activity G. Advance directives H. List of other physicians I.  Hospitalizations, surgeries, and ER visits in previous 12 months J.  [place] to include hearing, vision, cognitive, depression L. Referrals and appointments - none  In addition, I am unable to review and discuss with incapacitated patient certain preventive protocols, quality metrics, and best practice recommendations. A written personalized care plan for preventive services as well as general preventive health recommendations were provided to patient.  See attached scanned questionnaire for additional information.   Signed,   Tyson Dense, RN Nurse Health Advisor   Quick Notes   Health Maintenance: up to date, pt is hospice     Abnormal Screen: MMSE 22/30, did not pass clock drawing PHQ-9: 19     Patient Concerns: none     Nurse Concerns: none

## 2017-01-31 NOTE — Telephone Encounter (Signed)
Faxed signed orders back to [facility] at [phone]. Received fax confirmation.

## 2017-01-31 NOTE — Patient Instructions (Signed)
[name] , Thank you for taking time to come for your Medicare Wellness Visit. I appreciate your ongoing commitment to your health goals. Please review the following plan we discussed and let me know if I can assist you in the future.   Screening recommendations/referrals: Colonoscopy excluded, pt is over age 82 Recommended yearly ophthalmology/optometry visit for glaucoma screening and checkup Recommended yearly dental visit for hygiene and checkup  Vaccinations: Influenza vaccine up to date Pneumococcal vaccine up to date Tdap vaccine up to date Shingles vaccine up to date    Advanced directives: Need copies for chart  Conditions/risks identified: none  Next appointment: Dr. Bubba Camp makes rounds  Preventive Care 61 Years and Older, Male Preventive care refers to lifestyle choices and visits with your health care provider that can promote health and wellness. What does preventive care include?  A yearly physical exam. This is also called an annual well check.  Dental exams once or twice a year.  Routine eye exams. Ask your health care provider how often you should have your eyes checked.  Personal lifestyle choices, including:  Daily care of your teeth and gums.  Regular physical activity.  Eating a healthy diet.  Avoiding tobacco and drug use.  Limiting alcohol use.  Practicing safe sex.  Taking low doses of aspirin every day.  Taking vitamin and mineral supplements as recommended by your health care provider. What happens during an annual well check? The services and screenings done by your health care provider during your annual well check will depend on your age, overall health, lifestyle risk factors, and family history of disease. Counseling  Your health care provider may ask you questions about your:  Alcohol use.  Tobacco use.  Drug use.  Emotional well-being.  Home and relationship well-being.  Sexual activity.  Eating habits.  History of  falls.  Memory and ability to understand (cognition).  Work and work Statistician. Screening  You may have the following tests or measurements:  Height, weight, and BMI.  Blood pressure.  Lipid and cholesterol levels. These may be checked every 5 years, or more frequently if you are over 80 years old.  Skin check.  Lung cancer screening. You may have this screening every year starting at age 16 if you have a 30-pack-year history of smoking and currently smoke or have quit within the past 15 years.  Fecal occult blood test (FOBT) of the stool. You may have this test every year starting at age 49.  Flexible sigmoidoscopy or colonoscopy. You may have a sigmoidoscopy every 5 years or a colonoscopy every 10 years starting at age 26.  Prostate cancer screening. Recommendations will vary depending on your family history and other risks.  Hepatitis C blood test.  Hepatitis B blood test.  Sexually transmitted disease (STD) testing.  Diabetes screening. This is done by checking your blood sugar (glucose) after you have not eaten for a while (fasting). You may have this done every 1-3 years.  Abdominal aortic aneurysm (AAA) screening. You may need this if you are a current or former smoker.  Osteoporosis. You may be screened starting at age 67 if you are at high risk. Talk with your health care provider about your test results, treatment options, and if necessary, the need for more tests. Vaccines  Your health care provider may recommend certain vaccines, such as:  Influenza vaccine. This is recommended every year.  Tetanus, diphtheria, and acellular pertussis (Tdap, Td) vaccine. You may need a Td booster every 10  years.  Zoster vaccine. You may need this after age 53.  Pneumococcal 13-valent conjugate (PCV13) vaccine. One dose is recommended after age 39.  Pneumococcal polysaccharide (PPSV23) vaccine. One dose is recommended after age 50. Talk to your health care provider about  which screenings and vaccines you need and how often you need them. This information is not intended to replace advice given to you by your health care provider. Make sure you discuss any questions you have with your health care provider. Document Released: 01/22/2015 Document Revised: 09/15/2015 Document Reviewed: 10/27/2014 Elsevier Interactive Patient Education  2017 [place] Prevention in the Home Falls can cause injuries. They can happen to people of all ages. There are many things you can do to make your home safe and to help prevent falls. What can I do on the outside of my home?  Regularly fix the edges of walkways and driveways and fix any cracks.  Remove anything that might make you trip as you walk through a door, such as a raised step or threshold.  Trim any bushes or trees on the path to your home.  Use bright outdoor lighting.  Clear any walking paths of anything that might make someone trip, such as rocks or tools.  Regularly check to see if handrails are loose or broken. Make sure that both sides of any steps have handrails.  Any raised decks and porches should have guardrails on the edges.  Have any leaves, snow, or ice cleared regularly.  Use sand or salt on walking paths during winter.  Clean up any spills in your garage right away. This includes oil or grease spills. What can I do in the bathroom?  Use night lights.  Install grab bars by the toilet and in the tub and shower. Do not use towel bars as grab bars.  Use non-skid mats or decals in the tub or shower.  If you need to sit down in the shower, use a plastic, non-slip stool.  Keep the floor dry. Clean up any water that spills on the floor as soon as it happens.  Remove soap buildup in the tub or shower regularly.  Attach bath mats securely with double-sided non-slip rug tape.  Do not have throw rugs and other things on the floor that can make you trip. What can I do in the  bedroom?  Use night lights.  Make sure that you have a light by your bed that is easy to reach.  Do not use any sheets or blankets that are too big for your bed. They should not hang down onto the floor.  Have a firm chair that has side arms. You can use this for support while you get dressed.  Do not have throw rugs and other things on the floor that can make you trip. What can I do in the kitchen?  Clean up any spills right away.  Avoid walking on wet floors.  Keep items that you use a lot in easy-to-reach places.  If you need to reach something above you, use a strong step stool that has a grab bar.  Keep electrical cords out of the way.  Do not use floor polish or wax that makes floors slippery. If you must use wax, use non-skid floor wax.  Do not have throw rugs and other things on the floor that can make you trip. What can I do with my stairs?  Do not leave any items on the stairs.  Make sure that  there are handrails on both sides of the stairs and use them. Fix handrails that are broken or loose. Make sure that handrails are as long as the stairways.  Check any carpeting to make sure that it is firmly attached to the stairs. Fix any carpet that is loose or worn.  Avoid having throw rugs at the top or bottom of the stairs. If you do have throw rugs, attach them to the floor with carpet tape.  Make sure that you have a light switch at the top of the stairs and the bottom of the stairs. If you do not have them, ask someone to add them for you. What else can I do to help prevent falls?  Wear shoes that:  Do not have high heels.  Have rubber bottoms.  Are comfortable and fit you well.  Are closed at the toe. Do not wear sandals.  If you use a stepladder:  Make sure that it is fully opened. Do not climb a closed stepladder.  Make sure that both sides of the stepladder are locked into place.  Ask someone to hold it for you, if possible.  Clearly mark and make  sure that you can see:  Any grab bars or handrails.  First and last steps.  Where the edge of each step is.  Use tools that help you move around (mobility aids) if they are needed. These include:  Canes.  Walkers.  Scooters.  Crutches.  Turn on the lights when you go into a dark area. Replace any light bulbs as soon as they burn out.  Set up your furniture so you have a clear path. Avoid moving your furniture around.  If any of your floors are uneven, fix them.  If there are any pets around you, be aware of where they are.  Review your medicines with your doctor. Some medicines can make you feel dizzy. This can increase your chance of falling. Ask your doctor what other things that you can do to help prevent falls. This information is not intended to replace advice given to you by your health care provider. Make sure you discuss any questions you have with your health care provider. Document Released: 10/22/2008 Document Revised: 06/03/2015 Document Reviewed: 01/30/2014 Elsevier Interactive Patient Education  2017 [employer].

## 2017-02-01 DIAGNOSIS — N401 Enlarged prostate with lower urinary tract symptoms: Secondary | ICD-10-CM | POA: Diagnosis not present

## 2017-02-01 DIAGNOSIS — G3183 Dementia with Lewy bodies: Secondary | ICD-10-CM | POA: Diagnosis not present

## 2017-02-01 DIAGNOSIS — I951 Orthostatic hypotension: Secondary | ICD-10-CM | POA: Diagnosis not present

## 2017-02-01 DIAGNOSIS — R1310 Dysphagia, unspecified: Secondary | ICD-10-CM | POA: Diagnosis not present

## 2017-02-01 DIAGNOSIS — I10 Essential (primary) hypertension: Secondary | ICD-10-CM | POA: Diagnosis not present

## 2017-02-01 DIAGNOSIS — G20 Parkinson's disease: Secondary | ICD-10-CM | POA: Diagnosis not present

## 2017-02-02 ENCOUNTER — Encounter: Payer: Self-pay | Admitting: Internal Medicine

## 2017-02-02 ENCOUNTER — Non-Acute Institutional Stay (SKILLED_NURSING_FACILITY): Admitting: Internal Medicine

## 2017-02-02 DIAGNOSIS — Z7189 Other specified counseling: Secondary | ICD-10-CM

## 2017-02-02 DIAGNOSIS — J9611 Chronic respiratory failure with hypoxia: Secondary | ICD-10-CM | POA: Diagnosis not present

## 2017-02-02 DIAGNOSIS — R683 Clubbing of fingers: Secondary | ICD-10-CM

## 2017-02-02 DIAGNOSIS — F329 Major depressive disorder, single episode, unspecified: Secondary | ICD-10-CM

## 2017-02-02 DIAGNOSIS — I10 Essential (primary) hypertension: Secondary | ICD-10-CM | POA: Diagnosis not present

## 2017-02-02 DIAGNOSIS — F32A Depression, unspecified: Secondary | ICD-10-CM

## 2017-02-02 DIAGNOSIS — G3183 Dementia with Lewy bodies: Secondary | ICD-10-CM | POA: Diagnosis not present

## 2017-02-02 DIAGNOSIS — I951 Orthostatic hypotension: Secondary | ICD-10-CM | POA: Diagnosis not present

## 2017-02-02 DIAGNOSIS — G20 Parkinson's disease: Secondary | ICD-10-CM

## 2017-02-02 DIAGNOSIS — N401 Enlarged prostate with lower urinary tract symptoms: Secondary | ICD-10-CM | POA: Diagnosis not present

## 2017-02-02 DIAGNOSIS — R1310 Dysphagia, unspecified: Secondary | ICD-10-CM | POA: Diagnosis not present

## 2017-02-02 NOTE — Progress Notes (Signed)
Location:      Place of Service:    Provider:  Blanchie Serve, MD  Blanchie Serve, MD  Patient Care Team: Blanchie Serve, MD as PCP - General (Internal Medicine) Irine Seal, MD as Attending Physician (Urology) Charlotte Crumb, MD as Consulting Physician (Orthopedic Surgery)  Extended Emergency Contact Information Primary Emergency Contact: Sramek,Norma Address: [address]          [place], [place] [address] [place] Phone: [phone] Relation: Spouse Secondary Emergency Contact: [name],Lois  [place] Phone: [phone] Relation: Daughter  Code Status:  DNR  Goals of care: Advanced Directive information Advanced Directives 01/31/2017  Does Patient Have a Medical Advance Directive? Yes  Type of Advance Directive Living will;Out of facility DNR (pink MOST or yellow form);Healthcare Power of Attorney  Does patient want to make changes to medical advance directive? No - Patient declined  Copy of [place] in Chart? No - copy requested  Would patient like information on creating a medical advance directive? -  Pre-existing out of facility DNR order (yellow form or pink MOST form) Yellow form placed in chart (order not valid for inpatient use)     Chief Complaint  Patient presents with  . Acute Visit    patient and family concerns, goals of care discussion    HPI:  Pt is a 84 y.o. male seen today for an acute visit for several concerns and for goals of care counselling. He is seen with his wife, 2 daughters, nurse advocate and caregiver present. Hospice nurse and social worker present as well. He is slow with conversation and gets tearful during conversations. He is not happy about being in SNF. He was residing in independent living with his wife prior to the recent transition of care due to increased needs with assistance with ADLs. Family have several concerns regarding care at facility and  communication that have been answered by the facility team. Family is concerned about his parkinson's and if the current medication regimen is working for him. Changes to sinemet dosing and frequency were made about 2 weeks back by his neurologist. They are also concerned that if need for further medication changes arise, how will communication with his neurologist be made. They are concerned about his recent infection to nail bed and around area and how to prevent it in future. He has severe clubbing with his pulmonary hypertension and recently completed antibiotic for acute paronychia. He has been tearful and sad and wife is concerned. He has trouble taking his mucinex pills. Per his caregiver, it is difficult for him to soak his fingers in epsom salt for desired time.    Past Medical History:  Diagnosis Date  . Benign enlargement of prostate   . Chronic constipation 02/26/2014  . Contusion of forehead 03/09/2014  . Depression   . Dizziness   . Fracture, rib   . Gait disturbance   . History of fall 03/09/2014  . Hypertension   . Memory difficulties 07/20/2014  . Memory loss   . Orthostatic hypotension   . Osteoporosis   . Parkinson disease [facility])    Past Surgical History:  Procedure Laterality Date  . APPENDECTOMY    . CATARACT EXTRACTION Left   . HERNIA REPAIR    . TONSILLECTOMY      Allergies  Allergen Reactions  . Myrbetriq [Mirabegron] Nausea And Vomiting  . Stalevo [Carbidopa-Levodopa-Entacapone] Diarrhea    GI upset    Outpatient Encounter Medications  as of 02/02/2017  Medication Sig  . acetaminophen (TYLENOL) 500 MG tablet Take 500 mg by mouth 3 (three) times daily.  . AMITIZA 24 MCG capsule TAKE 1 CAPSULE TWICE DAILY WITH MEALS.  Marland Kitchen aspirin EC 81 MG tablet Take 81 mg by mouth at bedtime.  . carbamide peroxide (DEBROX) 6.5 % OTIC solution Place 5-10 drops into both ears daily as needed.  . carbidopa-levodopa (SINEMET CR) 50-200 MG tablet Take 1 tablet by mouth 4 (four)  times daily.  . carbidopa-levodopa (SINEMET) 25-100 MG tablet Take 0.5 tablets by mouth 4 (four) times daily.  Marland Kitchen docusate sodium (COLACE) 100 MG capsule Take 200 mg by mouth at bedtime.   Marland Kitchen guaiFENesin (MUCINEX) 600 MG 12 hr tablet Take 1 tablet (600 mg total) by mouth 2 (two) times daily as needed.  Marland Kitchen LORazepam (ATIVAN) 0.5 MG tablet Take 0.5 mg by mouth at bedtime.  Marland Kitchen LORazepam (ATIVAN) 0.5 MG tablet Take 0.5 mg by mouth every 4 (four) hours as needed for anxiety.  . pramipexole (MIRAPEX) 0.75 MG tablet TAKE 1 TABLET BY MOUTH 3 TIMES A DAY.  . Probiotic Product (ALIGN PO) Take 1 capsule by mouth daily.  Marland Kitchen pyridostigmine (MESTINON) 60 MG tablet Take 0.5 tablets (30 mg total) by mouth every morning.  Marland Kitchen QUEtiapine (SEROQUEL) 25 MG tablet Take 25 mg by mouth at bedtime.  . ranitidine (ZANTAC) 75 MG tablet Take 75 mg by mouth daily as needed for heartburn (reflux).   . triamcinolone (NASACORT) 55 MCG/ACT AERO nasal inhaler Place 2 sprays into the nose as needed (allergies).   No facility-administered encounter medications on file as of 02/02/2017.     Review of Systems  Constitutional: Negative for chills and fever.       Appetite is poor but he is eating some  HENT: Positive for hearing loss. Negative for congestion.   Eyes: Positive for visual disturbance.  Respiratory: Positive for cough and shortness of breath.        Not using oxygen at present  Cardiovascular: Negative for chest pain and palpitations.  Gastrointestinal: Negative for abdominal pain.  Musculoskeletal: Positive for gait problem.  Neurological: Positive for tremors and weakness. Negative for dizziness and headaches.  Psychiatric/Behavioral: Positive for confusion and dysphoric mood. The patient is nervous/anxious.        Memory loss     Immunization History  Administered Date(s) Administered  . Influenza, High Dose Seasonal PF 10/16/2016  . Influenza-Unspecified 10/11/2015  . Pneumococcal Conjugate-13 06/30/2013  .  Pneumococcal Polysaccharide-23 02/25/2004  . Tdap 07/04/2016  . Zoster 10/17/2004   Pertinent  Health Maintenance Due  Topic Date Due  . INFLUENZA VACCINE  Completed  . PNA vac Low Risk Adult  Completed   Fall Risk  01/31/2017 03/23/2016 03/13/2014  Falls in the past year? Yes Yes Yes  Number falls in past yr: 2 or more 1 2 or more  Injury with Fall? Yes Yes Yes  Risk Factor Category  - - High Fall Risk  Risk for fall due to : - History of fall(s) History of fall(s);Impaired balance/gait;Impaired mobility;Other (Comment)  Risk for fall due to: Comment - - Instability related to parkinsonism  Follow up - - Falls evaluation completed;Education provided;Falls prevention discussed   Functional Status Survey:     BP 110/60   Pulse 65   Temp 97.9 F (36.6 C) (Oral)   Resp 16   Ht 6' (1.829 m)   Wt 131 lb 14.4 oz (59.8 kg)   SpO2 95%  BMI 17.89 kg/m   Wt Readings from Last 3 Encounters:  02/02/17 131 lb 14.4 oz (59.8 kg)  01/31/17 136 lb (61.7 kg)  01/22/17 135 lb 9.6 oz (61.5 kg)   Physical Exam  Constitutional:  Thin built, frail, in no acute distress  HENT:  Head: Normocephalic and atraumatic.  Mouth/Throat: Oropharynx is clear and moist.  Eyes: Conjunctivae and EOM are normal. Right eye exhibits no discharge.  Neck: Neck supple.  Abdominal: Soft.  Musculoskeletal: He exhibits deformity.  Unsteady gait, has to be wheeled in wheelchair  Lymphadenopathy:    He has no cervical adenopathy.  Neurological: He is alert.  Oriented to person and place, masked facies, has resting tremors, cogwheel rigidity noted in upper extremities  Skin: Skin is warm and dry. He is not diaphoretic.  Severe clubbing with osteodystrophy of distal joints noted, erythema around nail bed with some crusting in some nail beds, no signs of infection  Psychiatric:  Tearful, anxious    Labs reviewed: Recent Labs    07/04/16 1038 07/05/16 0237  NA 133* 134*  K 4.0 4.0  CL 99* 104  CO2 27 24    GLUCOSE 119* 87  BUN 19 17  CREATININE 0.89 0.80  CALCIUM 8.5* 7.9*   No results for input(s): AST, ALT, ALKPHOS, BILITOT, PROT, ALBUMIN in the last 8760 hours. Recent Labs    07/04/16 1038 07/05/16 0237  WBC 7.1 6.2  NEUTROABS 5.6  --   HGB 11.1* 10.6*  HCT 33.1* 31.1*  MCV 86.9 85.9  PLT 230 205   Lab Results  Component Value Date   TSH 1.225 07/04/2016   Lab Results  Component Value Date   HGBA1C 5.6 07/04/2016   No results found for: CHOL, HDL, LDLCALC, LDLDIRECT, TRIG, CHOLHDL  Significant Diagnostic Results in last 30 days:  No results found.  Assessment/Plan  Parkinson's disease with behavioral disturbance Continue pramipexole 0.75 mg daily and pyridostigmine 30 mg daily. Continue sinemet CR 50-200 mg qid and sinemet IR 25-100 mg half tablet qid for now. No change in medication for now. Will make follow up with neurology in 3 weeks. Continue seroquel for now to help with his behavior.   Depression Likely situational with his current environment change, increased dependency and progressive parkinson's. Encouraged supportive care and redirection, staying positive, psychology counselling. Family have agreed to psychology counselling. Hospice services to arrange this. Continue seroquel and ativan 0.5 mg qhs and as needed for anxiety.  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 Prone to nail bed infection. Advised on epsom salt soaking for 10 -12 minutes twice a day for now. Consider NSAIDs if complaints of pain. Advised on using his oxygen.   Chronic respiratory failure with hypoxia Has history of pulmonary hypertension. On chart review, has mild sleep apnea noted 05/21/14. Chest xray showing interstital densities concerning for scarring. Echocardiogram 06/2016 shows normal EF and pulmonary artery pressure of 32 mm Hg, moderate focal basal septal hypertrophy. Not using oxygen at present. Would encourage to use oxygen. Breathing currently  stable. Supportive care.   Goals of care counselling Reviewed goals of care with patient and his HCPOA. Patient, his wife, 2 daughters, nursing advocate for paitnet, his caretaker/CNA, hospice nurse, hospice social worker, Dinah Ngtiech NP and myself present during the visit. At present, parkinson's medications are to be continued. Family have concerns about side effect of medication vs progression of disease and patient feeling low at present. Have recommended giving medication a trial of atleast 4 weeks  to see if desired outcome can be achieved and have recommended counselling from hospice grief counseller. Patient agrees to this along with family. Patient would like to be DNR in absence of pulse or breathing. He would like limited additional intervention and be sent to the hospital if medically indicated. He would like need for iv fluids and antibiotics be determined on a case by case basis and would like it if indicated for defined trial period. He does not want a feeding tube. Form reviewed and signed by myself, patient, his HCPOA and hospice social worker. I have been in the meeting from 12:10 pm to 1:30 pm reviewing goals of care and addressing other medical concerns, providing counseling. Greater than 50% of the visit spent in counselling to patient and his family. Questions have been answered to the best of my knowledge.    Family/ staff Communication: reviewed care plan with patient, his wife and daughters, caregiver, nurse advocate and hospice team.   I spent 90 minutes in total face-to-face time with the patient, more than 50% of which was spent in counseling and coordination of care, reviewing test results, reviewing medication and discussing or reviewing the diagnosis and treatment plan with patient and his family.     Labs/tests ordered:  none  Blanchie Serve, MD Internal Medicine [facility] [address] [place], [place] [address] Cell Phone  (Monday-Friday 8 am - 5 pm): [phone] On Call: [phone] and follow prompts after 5 pm and on weekends Office Phone: [phone] Office Fax: [phone]

## 2017-02-06 DIAGNOSIS — I10 Essential (primary) hypertension: Secondary | ICD-10-CM | POA: Diagnosis not present

## 2017-02-06 DIAGNOSIS — R1310 Dysphagia, unspecified: Secondary | ICD-10-CM | POA: Diagnosis not present

## 2017-02-06 DIAGNOSIS — G3183 Dementia with Lewy bodies: Secondary | ICD-10-CM | POA: Diagnosis not present

## 2017-02-06 DIAGNOSIS — N401 Enlarged prostate with lower urinary tract symptoms: Secondary | ICD-10-CM | POA: Diagnosis not present

## 2017-02-06 DIAGNOSIS — G20 Parkinson's disease: Secondary | ICD-10-CM | POA: Diagnosis not present

## 2017-02-06 DIAGNOSIS — I951 Orthostatic hypotension: Secondary | ICD-10-CM | POA: Diagnosis not present

## 2017-02-08 DIAGNOSIS — I951 Orthostatic hypotension: Secondary | ICD-10-CM | POA: Diagnosis not present

## 2017-02-08 DIAGNOSIS — G3183 Dementia with Lewy bodies: Secondary | ICD-10-CM | POA: Diagnosis not present

## 2017-02-08 DIAGNOSIS — R1310 Dysphagia, unspecified: Secondary | ICD-10-CM | POA: Diagnosis not present

## 2017-02-08 DIAGNOSIS — N401 Enlarged prostate with lower urinary tract symptoms: Secondary | ICD-10-CM | POA: Diagnosis not present

## 2017-02-08 DIAGNOSIS — I10 Essential (primary) hypertension: Secondary | ICD-10-CM | POA: Diagnosis not present

## 2017-02-08 DIAGNOSIS — G20 Parkinson's disease: Secondary | ICD-10-CM | POA: Diagnosis not present

## 2017-02-09 ENCOUNTER — Encounter: Payer: Self-pay | Admitting: Family

## 2017-02-09 ENCOUNTER — Non-Acute Institutional Stay (SKILLED_NURSING_FACILITY): Payer: Medicare Other | Admitting: Family

## 2017-02-09 DIAGNOSIS — G3183 Dementia with Lewy bodies: Secondary | ICD-10-CM | POA: Diagnosis not present

## 2017-02-09 DIAGNOSIS — I951 Orthostatic hypotension: Secondary | ICD-10-CM | POA: Diagnosis not present

## 2017-02-09 DIAGNOSIS — J309 Allergic rhinitis, unspecified: Secondary | ICD-10-CM | POA: Diagnosis not present

## 2017-02-09 DIAGNOSIS — R05 Cough: Secondary | ICD-10-CM | POA: Diagnosis not present

## 2017-02-09 DIAGNOSIS — R1310 Dysphagia, unspecified: Secondary | ICD-10-CM | POA: Diagnosis not present

## 2017-02-09 DIAGNOSIS — N401 Enlarged prostate with lower urinary tract symptoms: Secondary | ICD-10-CM | POA: Diagnosis not present

## 2017-02-09 DIAGNOSIS — R059 Cough, unspecified: Secondary | ICD-10-CM

## 2017-02-09 DIAGNOSIS — G20 Parkinson's disease: Secondary | ICD-10-CM | POA: Diagnosis not present

## 2017-02-09 DIAGNOSIS — I10 Essential (primary) hypertension: Secondary | ICD-10-CM | POA: Diagnosis not present

## 2017-02-09 NOTE — Progress Notes (Signed)
Location:  [place] Room Number: 13 Place of Service:  SNF (31) Provider: Dinah Ngetich FNP-C  Blanchie Serve, MD  Patient Care Team: Blanchie Serve, MD as PCP - General (Internal Medicine) Irine Seal, MD as Attending Physician (Urology) Charlotte Crumb, MD as Consulting Physician (Orthopedic Surgery)  Extended Emergency Contact Information Primary Emergency Contact: Sermons,Norma Address: [address]          [place], [place] [address] [place] Phone: [phone] Relation: Spouse Secondary Emergency Contact: [name],Lois  [place] [employer] [place] Phone: [phone] Relation: Daughter  Code Status:  DNR Goals of care: Advanced Directive information Advanced Directives 02/09/2017  Does Patient Have a Medical Advance Directive? Yes  Type of Advance Directive Out of facility DNR (pink MOST or yellow form);[place];Living will  Does patient want to make changes to medical advance directive? -  Copy of [place] in Chart? Yes  Would patient like information on creating a medical advance directive? -  Pre-existing out of facility DNR order (yellow form or pink MOST form) Yellow form placed in chart (order not valid for inpatient use);Pink MOST form placed in chart (order not valid for inpatient use)     Chief Complaint  Patient presents with  . Acute Visit    [place] per nursing staff    HPI:  Pt is a 84 y.o. male seen today at [facility] for an acute visit for evaluation of cough.He is seen in his room today with seater and facility Nurse at bedside.Facility Nurse reported that seater reported that patient had some wheezing during his morning care yesterday.No cough or wheezing noted today.Patient denies any shortness of breath or wheezing during the visit. Of note he has a medical history of chronic respiratory failure with hypoxia.His oxygen is off during the visit. He  denies any fever or chills.    Past Medical History:  Diagnosis Date  . Benign enlargement of prostate   . Chronic constipation 02/26/2014  . Contusion of forehead 03/09/2014  . Depression   . Dizziness   . Fracture, rib   . Gait disturbance   . History of fall 03/09/2014  . Hypertension   . Memory difficulties 07/20/2014  . Memory loss   . Orthostatic hypotension   . Osteoporosis   . Parkinson disease [facility])    Past Surgical History:  Procedure Laterality Date  . APPENDECTOMY    . CATARACT EXTRACTION Left   . HERNIA REPAIR    . TONSILLECTOMY      Allergies  Allergen Reactions  . Myrbetriq [Mirabegron] Nausea And Vomiting  . Stalevo [Carbidopa-Levodopa-Entacapone] Diarrhea    GI upset    Outpatient Encounter Medications as of 02/09/2017  Medication Sig  . acetaminophen (TYLENOL) 500 MG tablet Take 500 mg by mouth 3 (three) times daily.  . AMITIZA 24 MCG capsule TAKE 1 CAPSULE TWICE DAILY WITH MEALS.  Marland Kitchen aspirin EC 81 MG tablet Take 81 mg by mouth at bedtime.  . carbamide peroxide (DEBROX) 6.5 % OTIC solution Place 5-10 drops into both ears as needed.   . carbidopa-levodopa (SINEMET CR) 50-200 MG tablet Take 1 tablet by mouth 4 (four) times daily.  . carbidopa-levodopa (SINEMET) 25-100 MG tablet Take 0.5 tablets by mouth 4 (four) times daily.  Marland Kitchen docusate sodium (COLACE) 100 MG capsule Take 200 mg by mouth at bedtime.   Marland Kitchen guaiFENesin (ROBITUSSIN) 100 MG/5ML SOLN Take 10 mLs by mouth 2 (two) times daily.  Marland Kitchen  LORazepam (ATIVAN) 0.5 MG tablet Take 0.5 mg by mouth at bedtime.  Marland Kitchen LORazepam (ATIVAN) 0.5 MG tablet Take 0.5 mg by mouth every 8 (eight) hours as needed for anxiety.   . OXYGEN Inhale 2 L into the lungs continuous.  . pramipexole (MIRAPEX) 0.75 MG tablet TAKE 1 TABLET BY MOUTH 3 TIMES A DAY.  . Probiotic Product (ALIGN PO) Take 1 capsule by mouth daily.  Marland Kitchen pyridostigmine (MESTINON) 60 MG tablet Take 0.5 tablets (30 mg total) by mouth every morning.  Marland Kitchen QUEtiapine  (SEROQUEL) 25 MG tablet Take 25 mg by mouth at bedtime.  . ranitidine (ZANTAC) 75 MG tablet Take 75 mg by mouth daily.   Marland Kitchen triamcinolone (NASACORT) 55 MCG/ACT AERO nasal inhaler Place 2 sprays into the nose daily as needed (allergies).   . [DISCONTINUED] guaiFENesin (MUCINEX) 600 MG 12 hr tablet Take 1 tablet (600 mg total) by mouth 2 (two) times daily as needed. (Patient not taking: Reported on 02/09/2017)   No facility-administered encounter medications on file as of 02/09/2017.     Review of Systems  Constitutional: Negative for appetite change, chills and fever.  HENT: Positive for hearing loss. Negative for congestion, rhinorrhea, sinus pressure, sinus pain, sneezing and sore throat.   Eyes: Negative for discharge, redness and itching.       Wears eye glasses   Respiratory: Negative for chest tightness, shortness of breath and wheezing.        Chronic respiratory failure with hypoxia wears oxygen 2 Liters via nasal cannula   Cardiovascular: Negative for chest pain, palpitations and leg swelling.  Gastrointestinal: Negative for abdominal distention, abdominal pain, constipation, diarrhea, nausea and vomiting.  Musculoskeletal: Positive for gait problem.  Skin: Negative for color change, pallor and rash.  Hematological: Does not bruise/bleed easily.  Psychiatric/Behavioral: Negative for agitation and sleep disturbance. The patient is not nervous/anxious.     Immunization History  Administered Date(s) Administered  . Influenza, High Dose Seasonal PF 10/16/2016  . Influenza-Unspecified 10/11/2015  . Pneumococcal Conjugate-13 06/30/2013  . Pneumococcal Polysaccharide-23 02/25/2004, 01/25/2017  . Tdap 07/04/2016  . Zoster 10/17/2004   Pertinent  Health Maintenance Due  Topic Date Due  . INFLUENZA VACCINE  Completed  . PNA vac Low Risk Adult  Completed   Fall Risk  01/31/2017 03/23/2016 03/13/2014  Falls in the past year? Yes Yes Yes  Number falls in past yr: 2 or more 1 2 or more    Injury with Fall? Yes Yes Yes  Risk Factor Category  - - High Fall Risk  Risk for fall due to : - History of fall(s) History of fall(s);Impaired balance/gait;Impaired mobility;Other (Comment)  Risk for fall due to: Comment - - Instability related to parkinsonism  Follow up - - Falls evaluation completed;Education provided;Falls prevention discussed   Functional Status Survey:    Vitals:   02/09/17 1011  BP: 116/64  Pulse: 60  Resp: 15  Temp: (!) 96.8 F (36 C)  SpO2: 95%  Weight: 131 lb 1.6 oz (59.5 kg)  Height: 6' (1.829 m)   Body mass index is 17.78 kg/m. Physical Exam  Constitutional:  Thin tall frail elderly in no acute distress   HENT:  Head: Normocephalic.  Right Ear: External ear normal.  Left Ear: External ear normal.  Mouth/Throat: Oropharynx is clear and moist. No oropharyngeal exudate.  Eyes: Conjunctivae and EOM are normal. Pupils are equal, round, and reactive to light. Right eye exhibits no discharge. Left eye exhibits no discharge. No scleral icterus.  Neck:  Normal range of motion. No JVD present. No thyromegaly present.  Cardiovascular: Normal rate, regular rhythm and normal heart sounds. Exam reveals no gallop and no friction rub.  No murmur heard. Pulmonary/Chest: Effort normal and breath sounds normal. No respiratory distress. He has no wheezes. He has no rales.  Oxygen 2 liters via nasal cannula off during the visit no difficulties breathing noted.   Abdominal: Soft. Bowel sounds are normal. He exhibits no distension. There is no tenderness. There is no rebound and no guarding.  Musculoskeletal: He exhibits no tenderness.  Moves x 4 extremities.Wheelchair bound and transfers with assistance.knee high ted hose in place.   Lymphadenopathy:    He has no cervical adenopathy.  Neurological: Coordination normal.  Alert and oriented to person and place   Skin: Skin is warm and dry. No rash noted. No pallor.  Psychiatric: He has a normal mood and affect. His  behavior is normal.   Labs reviewed: Recent Labs    07/04/16 1038 07/05/16 0237  NA 133* 134*  K 4.0 4.0  CL 99* 104  CO2 27 24  GLUCOSE 119* 87  BUN 19 17  CREATININE 0.89 0.80  CALCIUM 8.5* 7.9*    Recent Labs    07/04/16 1038 07/05/16 0237  WBC 7.1 6.2  NEUTROABS 5.6  --   HGB 11.1* 10.6*  HCT 33.1* 31.1*  MCV 86.9 85.9  PLT 230 205   Lab Results  Component Value Date   TSH 1.225 07/04/2016   Lab Results  Component Value Date   HGBA1C 5.6 07/04/2016    Significant Diagnostic Results in last 30 days:  No results found.  Assessment/Plan  Cough Afebrile.VSS. Hx of chronic respiratory failure with hypoxia uses oxygen 2 liters via nasal cannula.Oxygen off during the visit respiration unlabored. Lungs examine findings negative.Wheezing was reported by seater during morning care one day ago but none today.Suspect this could be from his chronic respiratory failure verse an acute findings.Seems to be stable during visit. Will continue with oxygen via nasal cannula for now and monitor.will initiate breathing treatment if wheezes noted.Continue on hospice service.  Family/ staff Communication: Reviewed plan of care with patient and facility Nurse.   Labs/tests ordered: None   Dinah C Ngetich, NP

## 2017-02-13 DIAGNOSIS — N401 Enlarged prostate with lower urinary tract symptoms: Secondary | ICD-10-CM | POA: Diagnosis not present

## 2017-02-13 DIAGNOSIS — G3183 Dementia with Lewy bodies: Secondary | ICD-10-CM | POA: Diagnosis not present

## 2017-02-13 DIAGNOSIS — I10 Essential (primary) hypertension: Secondary | ICD-10-CM | POA: Diagnosis not present

## 2017-02-13 DIAGNOSIS — I951 Orthostatic hypotension: Secondary | ICD-10-CM | POA: Diagnosis not present

## 2017-02-13 DIAGNOSIS — G20 Parkinson's disease: Secondary | ICD-10-CM | POA: Diagnosis not present

## 2017-02-13 DIAGNOSIS — R1310 Dysphagia, unspecified: Secondary | ICD-10-CM | POA: Diagnosis not present

## 2017-02-14 ENCOUNTER — Non-Acute Institutional Stay (SKILLED_NURSING_FACILITY): Payer: Medicare Other | Admitting: Family

## 2017-02-14 DIAGNOSIS — R4189 Other symptoms and signs involving cognitive functions and awareness: Secondary | ICD-10-CM | POA: Diagnosis not present

## 2017-02-14 DIAGNOSIS — I959 Hypotension, unspecified: Secondary | ICD-10-CM | POA: Diagnosis not present

## 2017-02-14 NOTE — Progress Notes (Signed)
Location:  [place] Room Number: 13 Place of Service:  SNF (31) Provider: Jania Steinke FNP-C  Blanchie Serve, MD  Patient Care Team: Blanchie Serve, MD as PCP - General (Internal Medicine) Irine Seal, MD as Attending Physician (Urology) Charlotte Crumb, MD as Consulting Physician (Orthopedic Surgery)  Extended Emergency Contact Information Primary Emergency Contact: Bedoya,Norma Address: [address]          [place], [place] [address] [place] Phone: [phone] Relation: Spouse Secondary Emergency Contact: [name],Lois  [place] [employer] Phone: [phone] Relation: Daughter  Code Status:  DNR Goals of care: Advanced Directive information Advanced Directives 02/09/2017  Does Patient Have a Medical Advance Directive? Yes  Type of Advance Directive Out of facility DNR (pink MOST or yellow form);[place] Aguir[place];Living will  Does patient want to make changes to medical advance directive? -  Copy of [place] in Chart? Yes  Would patient like information on creating a medical advance directive? -  Pre-existing out of facility DNR order (yellow form or pink MOST form) Yellow form placed in chart (order not valid for inpatient use);Pink MOST form placed in chart (order not valid for inpatient use)     Chief Complaint  Patient presents with  . Acute Visit    unresponsive     HPI:  Pt is a 84 y.o. male seen today at [facility] for an acute visit for evaluation of unresponsiveness.He is seen in his room today with wife,seater and visitor, facility Nurse at bedside.Provider called to the room by facility Nurse who states patient's wife came to the Nursing station and reported that she had been in the room with patient for one hour and patient has not woken up.Also stated patient's seater had checked blood pressure and was low.Facility Nurse assessed patient Temp 97.8,pulse  63,Resp 18,B/P 92/60 and oxygen saturation 92% on 2 liters via nasal cannula.Patient was also noted to be drooling on the right side.Nurse states patient was assisted to bed but remained non-verbal.Upon arrival to patient's room provider found patient lying in bed non-verbal,not following commands, strong carotid and apical pulses.Patient's legs elevated with much improvement.Patient started moving legs, arms yawning and coughing at times. Patient finally opened eyes when asked to open.[name] states patient has had unresponsive episodes multiple times while in the independent living and has had to be assisted to bed and legs elevated. Hospice services was notified by facility Nurse.  Past Medical History:  Diagnosis Date  . Benign enlargement of prostate   . Chronic constipation 02/26/2014  . Contusion of forehead 03/09/2014  . Depression   . Dizziness   . Fracture, rib   . Gait disturbance   . History of fall 03/09/2014  . Hypertension   . Memory difficulties 07/20/2014  . Memory loss   . Orthostatic hypotension   . Osteoporosis   . Parkinson disease [facility])    Past Surgical History:  Procedure Laterality Date  . APPENDECTOMY    . CATARACT EXTRACTION Left   . HERNIA REPAIR    . TONSILLECTOMY      Allergies  Allergen Reactions  . Myrbetriq [Mirabegron] Nausea And Vomiting  . Stalevo [Carbidopa-Levodopa-Entacapone] Diarrhea    GI upset    Outpatient Encounter Medications as of 02/14/2017  Medication Sig  . acetaminophen (TYLENOL) 500 MG tablet Take 500 mg by mouth 3 (three) times daily.  . AMITIZA 24 MCG capsule TAKE 1 CAPSULE TWICE DAILY WITH MEALS.  Marland Kitchen  aspirin EC 81 MG tablet Take 81 mg by mouth at bedtime.  . carbamide peroxide (DEBROX) 6.5 % OTIC solution Place 5-10 drops into both ears as needed.   . carbidopa-levodopa (SINEMET CR) 50-200 MG tablet Take 1 tablet by mouth 4 (four) times daily.  . carbidopa-levodopa (SINEMET) 25-100 MG tablet Take 0.5 tablets by mouth 4  (four) times daily.  Marland Kitchen docusate sodium (COLACE) 100 MG capsule Take 200 mg by mouth at bedtime.   Marland Kitchen guaiFENesin (ROBITUSSIN) 100 MG/5ML SOLN Take 10 mLs by mouth 2 (two) times daily.  Marland Kitchen LORazepam (ATIVAN) 0.5 MG tablet Take 0.5 mg by mouth at bedtime.  Marland Kitchen LORazepam (ATIVAN) 0.5 MG tablet Take 0.5 mg by mouth every 8 (eight) hours as needed for anxiety.   . OXYGEN Inhale 2 L into the lungs continuous.  . pramipexole (MIRAPEX) 0.75 MG tablet TAKE 1 TABLET BY MOUTH 3 TIMES A DAY.  . Probiotic Product (ALIGN PO) Take 1 capsule by mouth daily.  Marland Kitchen pyridostigmine (MESTINON) 60 MG tablet Take 0.5 tablets (30 mg total) by mouth every morning.  Marland Kitchen QUEtiapine (SEROQUEL) 25 MG tablet Take 25 mg by mouth at bedtime.  . ranitidine (ZANTAC) 75 MG tablet Take 75 mg by mouth daily.   Marland Kitchen triamcinolone (NASACORT) 55 MCG/ACT AERO nasal inhaler Place 2 sprays into the nose daily as needed (allergies).    No facility-administered encounter medications on file as of 02/14/2017.     Review of Systems  Unable to perform ROS: Acuity of condition (additional information provided by patient's wife,seater and facility Nurse/supervisor.)    Immunization History  Administered Date(s) Administered  . Influenza, High Dose Seasonal PF 10/16/2016  . Influenza-Unspecified 10/11/2015  . Pneumococcal Conjugate-13 06/30/2013  . Pneumococcal Polysaccharide-23 02/25/2004, 01/25/2017  . Tdap 07/04/2016  . Zoster 10/17/2004   Pertinent  Health Maintenance Due  Topic Date Due  . INFLUENZA VACCINE  Completed  . PNA vac Low Risk Adult  Completed   Fall Risk  01/31/2017 03/23/2016 03/13/2014  Falls in the past year? Yes Yes Yes  Number falls in past yr: 2 or more 1 2 or more  Injury with Fall? Yes Yes Yes  Risk Factor Category  - - High Fall Risk  Risk for fall due to : - History of fall(s) History of fall(s);Impaired balance/gait;Impaired mobility;Other (Comment)  Risk for fall due to: Comment - - Instability related to  parkinsonism  Follow up - - Falls evaluation completed;Education provided;Falls prevention discussed    Vitals:   02/14/17 1305  BP: 92/60  Pulse: 63  Resp: 18  Temp: 97.8 F (36.6 C)  SpO2: 92%  Weight: 131 lb 1.6 oz (59.5 kg)  Height: 6' (1.829 m)   Body mass index is 17.78 kg/m. Physical Exam  Constitutional: No distress.  Tall thin frail elderly non-verbal and nonreponsive to stimuli.Responded to stimuli after the legs were elevated above heart level in bed.   HENT:  Head: Normocephalic.  Mouth/Throat: Oropharynx is clear and moist. No oropharyngeal exudate.  Eyes: Conjunctivae are normal. Pupils are equal, round, and reactive to light. No scleral icterus.  Neck: Normal range of motion. No JVD present. No thyromegaly present.  Cardiovascular: Normal rate, regular rhythm, normal heart sounds and intact distal pulses. Exam reveals no gallop and no friction rub.  No murmur heard. Pulmonary/Chest: Effort normal and breath sounds normal. No respiratory distress. He has no wheezes. He has no rales.  Oxygen via nasal cannula in place   Abdominal: Soft. Bowel sounds are  normal. He exhibits no distension. There is no tenderness. There is no rebound and no guarding.  Musculoskeletal: He exhibits no edema or tenderness.  Lymphadenopathy:    He has no cervical adenopathy.  Neurological: He is alert.  non-verbal and nonreponsive to stimuli.Responded to stimuli after the legs were elevated above heart level in bed.    Skin: Skin is warm and dry. No rash noted. No erythema.  Psychiatric: He has a normal mood and affect.   Labs reviewed: Recent Labs    07/04/16 1038 07/05/16 0237  NA 133* 134*  K 4.0 4.0  CL 99* 104  CO2 27 24  GLUCOSE 119* 87  BUN 19 17  CREATININE 0.89 0.80  CALCIUM 8.5* 7.9*    Recent Labs    07/04/16 1038 07/05/16 0237  WBC 7.1 6.2  NEUTROABS 5.6  --   HGB 11.1* 10.6*  HCT 33.1* 31.1*  MCV 86.9 85.9  PLT 230 205   Lab Results  Component Value  Date   TSH 1.225 07/04/2016   Lab Results  Component Value Date   HGBA1C 5.6 07/04/2016    Significant Diagnostic Results in last 30 days:  No results found.  Assessment/Plan 1. Unresponsive episode Afebrile.Wife reported non responsive.Responded to stimuli and opened eyes after the legs were elevated above heart level in bed.Per patient's wife and facility Nurse patient has had episodes of non responsiveness in the past whenever the blood pressure is low.Patient code status is DNR and currently on Hospice service.I spoke with Dr.Mazzocchi Hospice provider concerning patient's episode of unresponsive. will continue to monitor.continue on hospice service.    2. Hypotension Chronic hx of low blood pressures.B/p 92/60 continue to monitor.continue on hospice service.    Family/ staff Communication: Reviewed plan of care with patient's wife,Dr.Mazzocchi Hospice service MD and facility Nurse/ supervisor.  Labs/tests ordered: None   Kana Reimann C Rohini Jaroszewski, NP

## 2017-02-16 ENCOUNTER — Encounter: Payer: Self-pay | Admitting: Neurology

## 2017-02-17 DIAGNOSIS — G3183 Dementia with Lewy bodies: Secondary | ICD-10-CM | POA: Diagnosis not present

## 2017-02-17 DIAGNOSIS — I951 Orthostatic hypotension: Secondary | ICD-10-CM | POA: Diagnosis not present

## 2017-02-17 DIAGNOSIS — R1310 Dysphagia, unspecified: Secondary | ICD-10-CM | POA: Diagnosis not present

## 2017-02-17 DIAGNOSIS — G20 Parkinson's disease: Secondary | ICD-10-CM | POA: Diagnosis not present

## 2017-02-17 DIAGNOSIS — N401 Enlarged prostate with lower urinary tract symptoms: Secondary | ICD-10-CM | POA: Diagnosis not present

## 2017-02-17 DIAGNOSIS — I10 Essential (primary) hypertension: Secondary | ICD-10-CM | POA: Diagnosis not present

## 2017-02-19 ENCOUNTER — Ambulatory Visit (INDEPENDENT_AMBULATORY_CARE_PROVIDER_SITE_OTHER): Admitting: Neurology

## 2017-02-19 ENCOUNTER — Non-Acute Institutional Stay (SKILLED_NURSING_FACILITY): Payer: Medicare Other | Admitting: Family

## 2017-02-19 ENCOUNTER — Encounter: Payer: Self-pay | Admitting: Family

## 2017-02-19 ENCOUNTER — Encounter: Payer: Self-pay | Admitting: Neurology

## 2017-02-19 VITALS — BP 151/68 | HR 59

## 2017-02-19 DIAGNOSIS — K5901 Slow transit constipation: Secondary | ICD-10-CM | POA: Diagnosis not present

## 2017-02-19 DIAGNOSIS — N401 Enlarged prostate with lower urinary tract symptoms: Secondary | ICD-10-CM | POA: Diagnosis not present

## 2017-02-19 DIAGNOSIS — I951 Orthostatic hypotension: Secondary | ICD-10-CM

## 2017-02-19 DIAGNOSIS — G20 Parkinson's disease: Secondary | ICD-10-CM

## 2017-02-19 DIAGNOSIS — J9611 Chronic respiratory failure with hypoxia: Secondary | ICD-10-CM

## 2017-02-19 DIAGNOSIS — G20A1 Parkinson's disease without dyskinesia, without mention of fluctuations: Secondary | ICD-10-CM

## 2017-02-19 DIAGNOSIS — R683 Clubbing of fingers: Secondary | ICD-10-CM | POA: Diagnosis not present

## 2017-02-19 DIAGNOSIS — R1310 Dysphagia, unspecified: Secondary | ICD-10-CM | POA: Diagnosis not present

## 2017-02-19 DIAGNOSIS — I10 Essential (primary) hypertension: Secondary | ICD-10-CM | POA: Diagnosis not present

## 2017-02-19 DIAGNOSIS — G3183 Dementia with Lewy bodies: Secondary | ICD-10-CM | POA: Diagnosis not present

## 2017-02-19 MED ORDER — BUPROPION HCL ER (SR) 100 MG PO TB12: 100.0000 mg | ORAL_TABLET | Freq: Every day | ORAL | 1 refills | Status: DC

## 2017-02-19 MED ORDER — CARBIDOPA-LEVODOPA 25-250 MG PO TABS: 1.0000 | ORAL_TABLET | Freq: Four times a day (QID) | ORAL | 1 refills | Status: AC

## 2017-02-19 MED ORDER — BUPROPION HCL ER (SR) 100 MG PO TB12: 100.0000 mg | ORAL_TABLET | Freq: Every day | ORAL | 1 refills | Status: AC

## 2017-02-19 NOTE — Patient Instructions (Signed)
   WE will stop the Sinemet cr 50/200 mg and sinemet IR 25/100.  Start sinemet 25/250 one 4 times a day.  Start wellbutrin 100 mg daily

## 2017-02-19 NOTE — Progress Notes (Signed)
Location:  [place] Room Number: 13 Place of Service:  SNF (31) Provider: Dinah Ngetich FNP-C   Blanchie Serve, MD  Patient Care Team: Blanchie Serve, MD as PCP - General (Internal Medicine) Irine Seal, MD as Attending Physician (Urology) Charlotte Crumb, MD as Consulting Physician (Orthopedic Surgery)  Extended Emergency Contact Information Primary Emergency Contact: [name],Norma Address: [address]          [place], [place] [address] [place] Phone: [phone] Relation: Spouse Secondary Emergency Contact: [name],Lois  [place] [employer] Phone: [phone] Relation: Daughter  Code Status:  DNR Goals of care: Advanced Directive information Advanced Directives 02/19/2017  Does Patient Have a Medical Advance Directive? Yes  Type of Paramedic of [place];Out of facility DNR (pink MOST or yellow form);Living will  Does patient want to make changes to medical advance directive? -  Copy of [place] in Chart? Yes  Would patient like information on creating a medical advance directive? -  Pre-existing out of facility DNR order (yellow form or pink MOST form) Yellow form placed in chart (order not valid for inpatient use);Pink MOST form placed in chart (order not valid for inpatient use)     Chief Complaint  Patient presents with  . Medical Management of Chronic Issues    routine visit    HPI:  Pt is a 84 y.o. male seen today [place] for medical management of chronic diseases.he has a medical history of Parkinson disease,chronic respiratory failure with hypoxia on oxygen via nasal cannula,orthostatic hypotension,GERD among other conditions.He is seen in his room today with facility Nurse,patient's wife and care giver at bedside.[name] states patient had an appointment with Neurologist Dr.willis sinemet decreased to 25/250 mg one by mouth four times  daily.Patient was also started on Wellbutrin SR 100 mg Tablet daily for depression.Facility Nurse states Patient's daughter request soaking of clubbed fingers with epsom salt to be continued.Daughter also stated patient's great Toenail was trimmed at the beauty shop and some liquid like pus was noted.she would like NP to evaluate great toenail.Care giver states no drainage,redness or tenderness noted on toenail today.No recent fall episode reported.No recent weight loss over one month.        Past Medical History:  Diagnosis Date  . Benign enlargement of prostate   . Chronic constipation 02/26/2014  . Contusion of forehead 03/09/2014  . Depression   . Dizziness   . Fracture, rib   . Gait disturbance   . History of fall 03/09/2014  . Hypertension   . Memory difficulties 07/20/2014  . Memory loss   . Orthostatic hypotension   . Osteoporosis   . [name]    Past Surgical History:  Procedure Laterality Date  . APPENDECTOMY    . CATARACT EXTRACTION Left   . HERNIA REPAIR    . TONSILLECTOMY      Allergies  Allergen Reactions  . Myrbetriq [Mirabegron] Nausea And Vomiting  . Stalevo [Carbidopa-Levodopa-Entacapone] Diarrhea    GI upset    Allergies as of 02/19/2017      Reactions   Myrbetriq [mirabegron] Nausea And Vomiting   Stalevo [carbidopa-levodopa-entacapone] Diarrhea   GI upset      Medication List        Accurate as of 02/19/17  5:43 PM. Always use your most recent med list.          acetaminophen 500 MG tablet Commonly known as:  TYLENOL Take  500 mg by mouth 3 (three) times daily.   ALIGN PO Take 1 capsule by mouth daily.   AMITIZA 24 MCG capsule Generic drug:  lubiprostone TAKE 1 CAPSULE TWICE DAILY WITH MEALS.   aspirin EC 81 MG tablet Take 81 mg by mouth at bedtime.   buPROPion 100 MG 12 hr tablet Commonly known as:  WELLBUTRIN SR Take 1 tablet (100 mg total) by mouth daily.   carbidopa-levodopa 25-250 MG tablet Commonly known as:   SINEMET Take 1 tablet by mouth 4 (four) times daily.   docusate sodium 100 MG capsule Commonly known as:  COLACE Take 200 mg by mouth at bedtime.   guaiFENesin 100 MG/5ML liquid Commonly known as:  ROBITUSSIN Take 10 mLs by mouth 2 (two) times daily.   LORazepam 0.5 MG tablet Commonly known as:  ATIVAN Take 0.5 mg by mouth at bedtime.   LORazepam 0.5 MG tablet Commonly known as:  ATIVAN Take 0.5 mg by mouth every 8 (eight) hours as needed for anxiety.   OXYGEN Inhale 2 L into the lungs continuous.   pramipexole 0.75 MG tablet Commonly known as:  MIRAPEX TAKE 1 TABLET BY MOUTH 3 TIMES A DAY.   pyridostigmine 60 MG tablet Commonly known as:  MESTINON Take 0.5 tablets (30 mg total) by mouth every morning.   QUEtiapine 25 MG tablet Commonly known as:  SEROQUEL Take 25 mg by mouth at bedtime.   ranitidine 75 MG tablet Commonly known as:  ZANTAC Take 75 mg by mouth daily.   triamcinolone 55 MCG/ACT Aero nasal inhaler Commonly known as:  NASACORT Place 2 sprays into the nose daily as needed (allergies).       Review of Systems  Constitutional: Negative for appetite change, chills, fatigue, fever and unexpected weight change.  HENT: Positive for hearing loss. Negative for congestion, rhinorrhea, sinus pressure, sinus pain, sneezing and sore throat.   Eyes: Positive for visual disturbance. Negative for discharge, redness and itching.  Respiratory: Negative for cough, chest tightness and wheezing.        Oxygen via nasal cannula   Cardiovascular: Positive for leg swelling. Negative for chest pain and palpitations.  Gastrointestinal: Negative for abdominal distention, abdominal pain, constipation, diarrhea, nausea and vomiting.  Endocrine: Negative for cold intolerance, heat intolerance, polydipsia, polyphagia and polyuria.  Genitourinary: Negative for flank pain and urgency.  Musculoskeletal: Positive for gait problem.  Skin: Negative for color change, pallor, rash and  wound.  Neurological: Positive for tremors. Negative for dizziness, light-headedness and headaches.  Hematological: Does not bruise/bleed easily.  Psychiatric/Behavioral: Negative for agitation and sleep disturbance. The patient is not nervous/anxious.     Immunization History  Administered Date(s) Administered  . Influenza, High Dose Seasonal PF 10/16/2016  . Influenza-Unspecified 10/11/2015  . Pneumococcal Conjugate-13 06/30/2013  . Pneumococcal Polysaccharide-23 02/25/2004, 01/25/2017  . Tdap 07/04/2016  . Zoster 10/17/2004   Pertinent  Health Maintenance Due  Topic Date Due  . INFLUENZA VACCINE  Completed  . PNA vac Low Risk Adult  Completed   Fall Risk  01/31/2017 03/23/2016 03/13/2014  Falls in the past year? Yes Yes Yes  Number falls in past yr: 2 or more 1 2 or more  Injury with Fall? Yes Yes Yes  Risk Factor Category  - - High Fall Risk  Risk for fall due to : - History of fall(s) History of fall(s);Impaired balance/gait;Impaired mobility;Other (Comment)  Risk for fall due to: Comment - - Instability related to parkinsonism  Follow up - - Falls evaluation  completed;Education provided;Falls prevention discussed     Vitals:   02/19/17 1411  BP: (!) 150/70  Pulse: 68  Resp: 14  Temp: (!) 97 F (36.1 C)  SpO2: 92%  Weight: 131 lb 1.6 oz (59.5 kg)  Height: 6' (1.829 m)   Body mass index is 17.78 kg/m. Physical Exam  Constitutional:  Tall thin frail elderly in no acute distress   HENT:  Head: Normocephalic.  Right Ear: External ear normal.  Left Ear: External ear normal.  Mouth/Throat: Oropharynx is clear and moist. No oropharyngeal exudate.  Eyes: Conjunctivae and EOM are normal. Pupils are equal, round, and reactive to light. Right eye exhibits no discharge. Left eye exhibits no discharge. No scleral icterus.  Eye glasses in place   Neck: Normal range of motion. No JVD present. No thyromegaly present.  Cardiovascular: Normal rate, regular rhythm, normal heart  sounds and intact distal pulses. Exam reveals no gallop and no friction rub.  No murmur heard. Pulmonary/Chest: Effort normal and breath sounds normal. No respiratory distress. He has no wheezes. He has no rales.  Oxygen 2 liters nasal cannula in place   Abdominal: Soft. Bowel sounds are normal. He exhibits no distension. There is no tenderness. There is no rebound and no guarding.  Musculoskeletal: He exhibits no edema or tenderness.  Unsteady gait transfers to wheelchair with assistance.   Lymphadenopathy:    He has no cervical adenopathy.  Neurological: He is alert. He displays tremor. Gait abnormal.  Skin: Skin is warm and dry. No rash noted. No erythema. No pallor.  Severe clubbing of fingers previous erythema has improved. No swelling or drainage noted.   Psychiatric: His affect is labile.   Labs reviewed: Recent Labs    07/04/16 1038 07/05/16 0237  NA 133* 134*  K 4.0 4.0  CL 99* 104  CO2 27 24  GLUCOSE 119* 87  BUN 19 17  CREATININE 0.89 0.80  CALCIUM 8.5* 7.9*     Recent Labs    07/04/16 1038 07/05/16 0237  WBC 7.1 6.2  NEUTROABS 5.6  --   HGB 11.1* 10.6*  HCT 33.1* 31.1*  MCV 86.9 85.9  PLT 230 205   Lab Results  Component Value Date   TSH 1.225 07/04/2016   Lab Results  Component Value Date   HGBA1C 5.6 07/04/2016   Significant Diagnostic Results in last 30 days:  No results found.  Assessment/Plan 1. Chronic respiratory failure with hypoxia  Breathing stable.lungs CTA.continue on oxygen 2 liters via nasal cannula.continue robitussin 10 ml twice daily.   2. Parkinson disease  Seen by Neurologist prior today sinemet decreased to 25/250 mg tablet four times daily.continue to monitor.Follow up with Neurologist DR.[name] in 4 weeks as directed.    3. Clubbing of fingers Afebrile.Previous swelling,redness,crust and drainage resolved.Patient's daughter want Epsom salt soak to be continued 5-10 minutes  twice daily.continue Epsom salt soak per POA  request. Continue to monitor for signs of infections.     4. Slow transit constipation Current regimen effective.continue to encourage oral intake and fluid hydration.  Family/ staff Communication: Reviewed plan of care with patient and facility Nurse.   Labs/tests ordered: None   Dinah C Ngetich, NP

## 2017-02-19 NOTE — Progress Notes (Signed)
Reason for visit: Parkinson's disease  Brian Aguirre is an 84 y.o. male  History of present illness:  Brian Aguirre is an 84 year old right-handed white male with a history of Parkinson's disease.  The patient currently is in the skilled nursing facility at [facility].  The patient has continued to have episodes of significant low blood pressure, he had an episode today with systolic pressures in the 50s with a loss of consciousness.  The patient will lay back for 10 minutes, the blood pressure will recover to the 140 range.  The patient continues to have a lot of coughing, bringing up phlegm in the morning.  He has some tremors of the arms off and on, he also has a chin tremor.  He has had some decline in his memory, he feels somewhat confused off and on.  The patient has had a significant decline in his ability to ambulate, he is able to walk some with his trainer with a walker, but most the time he spends in the wheelchair.  He is having trouble with swallowing, he has difficulty taking his medications.  He has had a reduction in appetite.  He sleeps well at night with the Seroquel, he has had increased problems with depression.  He may have some agitation at times.  He returns for an evaluation.  Past Medical History:  Diagnosis Date  . Benign enlargement of prostate   . Chronic constipation 02/26/2014  . Contusion of forehead 03/09/2014  . Depression   . Dizziness   . Fracture, rib   . Gait disturbance   . History of fall 03/09/2014  . Hypertension   . Memory difficulties 07/20/2014  . Memory loss   . Orthostatic hypotension   . Osteoporosis   . Parkinson disease [facility])     Past Surgical History:  Procedure Laterality Date  . APPENDECTOMY    . CATARACT EXTRACTION Left   . HERNIA REPAIR    . TONSILLECTOMY      Family History  Problem Relation Age of Onset  . Cancer Mother   . Heart failure Father   . Breast cancer Daughter     Social history:  reports that  has never  smoked. he has never used smokeless tobacco. He reports that he does not drink alcohol or use drugs.    Allergies  Allergen Reactions  . Myrbetriq [Mirabegron] Nausea And Vomiting  . Stalevo [Carbidopa-Levodopa-Entacapone] Diarrhea    GI upset    Medications:  Prior to Admission medications   Medication Sig Start Date End Date Taking? Authorizing Provider  acetaminophen (TYLENOL) 500 MG tablet Take 500 mg by mouth 3 (three) times daily.   Yes [provider]  AMITIZA 24 MCG capsule TAKE 1 CAPSULE TWICE DAILY WITH MEALS. 09/26/16  Yes Kathrynn Ducking, MD  aspirin EC 81 MG tablet Take 81 mg by mouth at bedtime.   Yes [provider]  docusate sodium (COLACE) 100 MG capsule Take 200 mg by mouth at bedtime.    Yes [provider]  guaiFENesin (ROBITUSSIN) 100 MG/5ML liquid Take 10 mLs by mouth 2 (two) times daily.   Yes [provider]  LORazepam (ATIVAN) 0.5 MG tablet Take 0.5 mg by mouth at bedtime.   Yes [provider]  LORazepam (ATIVAN) 0.5 MG tablet Take 0.5 mg by mouth every 8 (eight) hours as needed for anxiety.    Yes [provider]  OXYGEN Inhale 2 L into the lungs continuous.   Yes  [provider]  pramipexole (MIRAPEX) 0.75 MG tablet TAKE 1 TABLET BY MOUTH 3 TIMES A DAY. 12/06/16  Yes Kathrynn Ducking, MD  Probiotic Product (ALIGN PO) Take 1 capsule by mouth daily.   Yes [provider]  pyridostigmine (MESTINON) 60 MG tablet Take 0.5 tablets (30 mg total) by mouth every morning. 10/27/16  Yes Kathrynn Ducking, MD  QUEtiapine (SEROQUEL) 25 MG tablet Take 25 mg by mouth at bedtime.   Yes [provider]  ranitidine (ZANTAC) 75 MG tablet Take 75 mg by mouth daily.    Yes [provider]  triamcinolone (NASACORT) 55 MCG/ACT AERO nasal inhaler Place 2 sprays into the nose daily as needed (allergies).    Yes [provider]  buPROPion (WELLBUTRIN SR) 100 MG 12 hr tablet Take 1 tablet  (100 mg total) by mouth daily. 02/19/17   Kathrynn Ducking, MD  carbidopa-levodopa (SINEMET) 25-250 MG tablet Take 1 tablet by mouth 4 (four) times daily. 02/19/17   Kathrynn Ducking, MD    ROS:  Out of a complete 14 system review of symptoms, the patient complains only of the following symptoms, and all other reviewed systems are negative.  Drooling Cough, wheezing, shortness of breath Constipation Frequent infections Aching muscles, muscle cramps Bruising easily Dizziness, weakness, tremors Agitation, confusion, depression  Blood pressure (!) 151/68, pulse (!) 59, SpO2 97 %.  Physical Exam  General: The patient is alert and cooperative at the time of the examination.  Skin: No significant peripheral edema is noted.   Neurologic Exam  Mental status: The patient is alert and oriented x 3 at the time of the examination. The patient has apparent normal recent and remote memory, with an apparently normal attention span and concentration ability.   Cranial nerves: Facial symmetry is present.  Masking of the face is seen.  Speech is hypophonic.Marland Kitchen Extraocular movements are full, with exception that there is restriction of superior gaze. Visual fields are full.  Motor: The patient has good strength in all 4 extremities.  Sensory examination: Soft touch sensation is symmetric on the face, arms, and legs.  Coordination: The patient has good finger-nose-finger and heel-to-shin bilaterally, but there is apraxia with use of the extremities..  Gait and station: The patient is able to stand with assistance, he is not functionally able to walk.  Reflexes: Deep tendon reflexes are symmetric.   Assessment/Plan:  1.  Parkinson's disease  2.  Orthostatic hypotension  The patient has had a decline in his overall functional level, he is not able to walk effectively at this point.  He has had some decline in cognitive function and some worsening depression.  The patient will be placed on  Wellbutrin 100 mg daily, the Sinemet CR and Sinemet IR 25/100 mg tablets will be discontinued and the patient will be placed on Sinemet IR 25/250, 1 tablet 4 times daily.  The patient will follow-up in about 4 months.  He is currently followed through hospice.  Jill Alexanders MD 02/19/2017 1:14 PM  [facility] [address] [place], [place] [address]  Phone [phone] Fax [phone]

## 2017-02-20 DIAGNOSIS — I951 Orthostatic hypotension: Secondary | ICD-10-CM | POA: Diagnosis not present

## 2017-02-20 DIAGNOSIS — G3183 Dementia with Lewy bodies: Secondary | ICD-10-CM | POA: Diagnosis not present

## 2017-02-20 DIAGNOSIS — G20 Parkinson's disease: Secondary | ICD-10-CM | POA: Diagnosis not present

## 2017-02-20 DIAGNOSIS — R1310 Dysphagia, unspecified: Secondary | ICD-10-CM | POA: Diagnosis not present

## 2017-02-20 DIAGNOSIS — I10 Essential (primary) hypertension: Secondary | ICD-10-CM | POA: Diagnosis not present

## 2017-02-20 DIAGNOSIS — N401 Enlarged prostate with lower urinary tract symptoms: Secondary | ICD-10-CM | POA: Diagnosis not present

## 2017-02-21 ENCOUNTER — Non-Acute Institutional Stay (SKILLED_NURSING_FACILITY): Payer: Medicare Other | Admitting: Family

## 2017-02-21 ENCOUNTER — Encounter: Payer: Self-pay | Admitting: Family

## 2017-02-21 DIAGNOSIS — G20 Parkinson's disease: Secondary | ICD-10-CM | POA: Diagnosis not present

## 2017-02-21 DIAGNOSIS — N401 Enlarged prostate with lower urinary tract symptoms: Secondary | ICD-10-CM | POA: Diagnosis not present

## 2017-02-21 DIAGNOSIS — I10 Essential (primary) hypertension: Secondary | ICD-10-CM | POA: Diagnosis not present

## 2017-02-21 DIAGNOSIS — G3183 Dementia with Lewy bodies: Secondary | ICD-10-CM | POA: Diagnosis not present

## 2017-02-21 DIAGNOSIS — L600 Ingrowing nail: Secondary | ICD-10-CM | POA: Diagnosis not present

## 2017-02-21 DIAGNOSIS — R1310 Dysphagia, unspecified: Secondary | ICD-10-CM | POA: Diagnosis not present

## 2017-02-21 DIAGNOSIS — I951 Orthostatic hypotension: Secondary | ICD-10-CM | POA: Diagnosis not present

## 2017-02-21 NOTE — Progress Notes (Signed)
Location:  [place] Room Number: 13 Place of Service:  SNF (31) Provider: Dinah Ngetich FNP-C  Blanchie Serve, MD  Patient Care Team: Blanchie Serve, MD as PCP - General (Internal Medicine) Irine Seal, MD as Attending Physician (Urology) Charlotte Crumb, MD as Consulting Physician (Orthopedic Surgery)  Extended Emergency Contact Information Primary Emergency Contact: Stann,Norma Address: [address]          [place], [place] [address] [place] Phone: [phone] Relation: Spouse Secondary Emergency Contact: [name],Lois  [place] [employer] Phone: [phone] Relation: Daughter  Code Status:  DNR Goals of care: Advanced Directive information Advanced Directives 02/21/2017  Does Patient Have a Medical Advance Directive? Yes  Type of Paramedic of [place];Living will;Out of facility DNR (pink MOST or yellow form)  Does patient want to make changes to medical advance directive? -  Copy of [place] in Chart? Yes  Would patient like information on creating a medical advance directive? -  Pre-existing out of facility DNR order (yellow form or pink MOST form) Yellow form placed in chart (order not valid for inpatient use);Pink MOST form placed in chart (order not valid for inpatient use)     Chief Complaint  Patient presents with  . Acute Visit    Pus draining from right big toe    HPI:  Pt is a 84 y.o. male seen today at [facility] for an acute visit for evaluation of right great toe.he is seen in his room today with facility Nurse and care giver at the bedside.Patient's daughter reported to DON that patient was seen in the beauty shop to trim nail.she states right great toe had pus.Patient recently evaluated and there was no signs or symptoms of infections on toe.Patient's care giver states no drainage noted. No fever or chills.Patient was observed walking on  hallway with [place] assisted by personal trainer. He denies any acute issues during visit.   Past Medical History:  Diagnosis Date  . Benign enlargement of prostate   . Chronic constipation 02/26/2014  . Contusion of forehead 03/09/2014  . Depression   . Dizziness   . Fracture, rib   . Gait disturbance   . History of fall 03/09/2014  . Hypertension   . Memory difficulties 07/20/2014  . Memory loss   . Orthostatic hypotension   . Osteoporosis   . Parkinson disease [facility])    Past Surgical History:  Procedure Laterality Date  . APPENDECTOMY    . CATARACT EXTRACTION Left   . HERNIA REPAIR    . TONSILLECTOMY      Allergies  Allergen Reactions  . Myrbetriq [Mirabegron] Nausea And Vomiting  . Stalevo [Carbidopa-Levodopa-Entacapone] Diarrhea    GI upset    Outpatient Encounter Medications as of 02/21/2017  Medication Sig  . acetaminophen (TYLENOL) 500 MG tablet Take 500 mg by mouth 3 (three) times daily.  . AMITIZA 24 MCG capsule TAKE 1 CAPSULE TWICE DAILY WITH MEALS.  Marland Kitchen aspirin EC 81 MG tablet Take 81 mg by mouth at bedtime.  Marland Kitchen buPROPion (WELLBUTRIN SR) 100 MG 12 hr tablet Take 1 tablet (100 mg total) by mouth daily.  . carbamide peroxide (DEBROX) 6.5 % OTIC solution Place 5 drops into both ears as needed.  . carbidopa-levodopa (SINEMET) 25-250 MG tablet Take 1 tablet by mouth 4 (four) times daily.  Marland Kitchen docusate sodium (COLACE) 100 MG capsule Take 200 mg by mouth at bedtime.   Marland Kitchen guaiFENesin (  ROBITUSSIN) 100 MG/5ML liquid Take 10 mLs by mouth 2 (two) times daily.  Marland Kitchen LORazepam (ATIVAN) 0.5 MG tablet Take 0.5 mg by mouth at bedtime.  Marland Kitchen LORazepam (ATIVAN) 0.5 MG tablet Take 0.5 mg by mouth every 8 (eight) hours as needed for anxiety.   . OXYGEN Inhale 2 L into the lungs continuous.  . pramipexole (MIRAPEX) 0.75 MG tablet TAKE 1 TABLET BY MOUTH 3 TIMES A DAY.  . Probiotic Product (ALIGN PO) Take 1 capsule by mouth daily.  Marland Kitchen pyridostigmine (MESTINON) 60 MG tablet Take 0.5 tablets (30 mg  total) by mouth every morning.  Marland Kitchen QUEtiapine (SEROQUEL) 25 MG tablet Take 25 mg by mouth at bedtime.  . ranitidine (ZANTAC) 75 MG tablet Take 75 mg by mouth daily.   Marland Kitchen triamcinolone (NASACORT) 55 MCG/ACT AERO nasal inhaler Place 2 sprays into the nose daily as needed (allergies).    No facility-administered encounter medications on file as of 02/21/2017.     Review of Systems  Constitutional: Negative for chills, fatigue and fever.  Respiratory: Negative for cough, chest tightness, shortness of breath and wheezing.        Oxygen via nasal cannula   Cardiovascular: Negative for chest pain, palpitations and leg swelling.  Gastrointestinal: Negative for abdominal distention, abdominal pain, constipation, diarrhea, nausea and vomiting.  Musculoskeletal: Positive for gait problem.  Skin: Negative for color change, pallor, rash and wound.  Psychiatric/Behavioral: Negative for agitation and sleep disturbance. The patient is not nervous/anxious.     Immunization History  Administered Date(s) Administered  . Influenza, High Dose Seasonal PF 10/16/2016  . Influenza-Unspecified 10/11/2015  . Pneumococcal Conjugate-13 06/30/2013  . Pneumococcal Polysaccharide-23 02/25/2004, 01/25/2017  . Tdap 07/04/2016  . Zoster 10/17/2004   Pertinent  Health Maintenance Due  Topic Date Due  . INFLUENZA VACCINE  Completed  . PNA vac Low Risk Adult  Completed   Fall Risk  01/31/2017 03/23/2016 03/13/2014  Falls in the past year? Yes Yes Yes  Number falls in past yr: 2 or more 1 2 or more  Injury with Fall? Yes Yes Yes  Risk Factor Category  - - High Fall Risk  Risk for fall due to : - History of fall(s) History of fall(s);Impaired balance/gait;Impaired mobility;Other (Comment)  Risk for fall due to: Comment - - Instability related to parkinsonism  Follow up - - Falls evaluation completed;Education provided;Falls prevention discussed   Functional Status Survey:    Vitals:   02/21/17 1113  BP: (!) 116/56    Pulse: 60  Resp: 20  Temp: 98.4 F (36.9 C)  TempSrc: Oral  SpO2: 92%  Weight: 131 lb 1.6 oz (59.5 kg)  Height: 6' (1.829 m)   Body mass index is 17.78 kg/m. Physical Exam  Constitutional:  Thin tall frail elderly in no acute distress   HENT:  Head: Normocephalic.  Mouth/Throat: No oropharyngeal exudate.  Eyes: Conjunctivae and EOM are normal. Pupils are equal, round, and reactive to light. Right eye exhibits no discharge. Left eye exhibits no discharge.  Neck: Normal range of motion. No JVD present. No thyromegaly present.  Cardiovascular: Normal rate, regular rhythm, normal heart sounds and intact distal pulses. Exam reveals no gallop and no friction rub.  No murmur heard. Pulmonary/Chest: Effort normal and breath sounds normal. No respiratory distress. He has no wheezes. He has no rales.  Abdominal: Soft. Bowel sounds are normal. He exhibits no distension. There is no tenderness. There is no rebound and no guarding.  Lymphadenopathy:    He has  no cervical adenopathy.  Skin: Skin is warm and dry. No rash noted. No erythema. No pallor.  Right great toenail short.No redness,tenderness,swelling or drainage noted.Toenail edges ingrown.   Psychiatric: He has a normal mood and affect.   Labs reviewed: Recent Labs    07/04/16 1038 07/05/16 0237  NA 133* 134*  K 4.0 4.0  CL 99* 104  CO2 27 24  GLUCOSE 119* 87  BUN 19 17  CREATININE 0.89 0.80  CALCIUM 8.5* 7.9*    Recent Labs    07/04/16 1038 07/05/16 0237  WBC 7.1 6.2  NEUTROABS 5.6  --   HGB 11.1* 10.6*  HCT 33.1* 31.1*  MCV 86.9 85.9  PLT 230 205   Lab Results  Component Value Date   TSH 1.225 07/04/2016   Lab Results  Component Value Date   HGBA1C 5.6 07/04/2016    Significant Diagnostic Results in last 30 days:  No results found.  Assessment/Plan   Ingrowing right great toenail Afebrile.Right great toenail short.No redness,tenderness,swelling or drainage noted. Toenail edges ingrown. Refer to  podiatrist to evaluate.   Family/ staff Communication: Reviewed plan of care with patient and facility Nurse.   Labs/tests ordered: None   Dinah C Ngetich, NP

## 2017-02-22 DIAGNOSIS — G20 Parkinson's disease: Secondary | ICD-10-CM | POA: Diagnosis not present

## 2017-02-22 DIAGNOSIS — I10 Essential (primary) hypertension: Secondary | ICD-10-CM | POA: Diagnosis not present

## 2017-02-22 DIAGNOSIS — G3183 Dementia with Lewy bodies: Secondary | ICD-10-CM | POA: Diagnosis not present

## 2017-02-22 DIAGNOSIS — R1310 Dysphagia, unspecified: Secondary | ICD-10-CM | POA: Diagnosis not present

## 2017-02-22 DIAGNOSIS — I951 Orthostatic hypotension: Secondary | ICD-10-CM | POA: Diagnosis not present

## 2017-02-22 DIAGNOSIS — N401 Enlarged prostate with lower urinary tract symptoms: Secondary | ICD-10-CM | POA: Diagnosis not present

## 2017-02-23 DIAGNOSIS — I951 Orthostatic hypotension: Secondary | ICD-10-CM | POA: Diagnosis not present

## 2017-02-23 DIAGNOSIS — I10 Essential (primary) hypertension: Secondary | ICD-10-CM | POA: Diagnosis not present

## 2017-02-23 DIAGNOSIS — G20 Parkinson's disease: Secondary | ICD-10-CM | POA: Diagnosis not present

## 2017-02-23 DIAGNOSIS — G3183 Dementia with Lewy bodies: Secondary | ICD-10-CM | POA: Diagnosis not present

## 2017-02-23 DIAGNOSIS — R1310 Dysphagia, unspecified: Secondary | ICD-10-CM | POA: Diagnosis not present

## 2017-02-23 DIAGNOSIS — N401 Enlarged prostate with lower urinary tract symptoms: Secondary | ICD-10-CM | POA: Diagnosis not present

## 2017-02-27 DIAGNOSIS — I951 Orthostatic hypotension: Secondary | ICD-10-CM | POA: Diagnosis not present

## 2017-02-27 DIAGNOSIS — N401 Enlarged prostate with lower urinary tract symptoms: Secondary | ICD-10-CM | POA: Diagnosis not present

## 2017-02-27 DIAGNOSIS — G20 Parkinson's disease: Secondary | ICD-10-CM | POA: Diagnosis not present

## 2017-02-27 DIAGNOSIS — I10 Essential (primary) hypertension: Secondary | ICD-10-CM | POA: Diagnosis not present

## 2017-02-27 DIAGNOSIS — G3183 Dementia with Lewy bodies: Secondary | ICD-10-CM | POA: Diagnosis not present

## 2017-02-27 DIAGNOSIS — R1310 Dysphagia, unspecified: Secondary | ICD-10-CM | POA: Diagnosis not present

## 2017-03-01 DIAGNOSIS — R1310 Dysphagia, unspecified: Secondary | ICD-10-CM | POA: Diagnosis not present

## 2017-03-01 DIAGNOSIS — I10 Essential (primary) hypertension: Secondary | ICD-10-CM | POA: Diagnosis not present

## 2017-03-01 DIAGNOSIS — I951 Orthostatic hypotension: Secondary | ICD-10-CM | POA: Diagnosis not present

## 2017-03-01 DIAGNOSIS — G20 Parkinson's disease: Secondary | ICD-10-CM | POA: Diagnosis not present

## 2017-03-01 DIAGNOSIS — G3183 Dementia with Lewy bodies: Secondary | ICD-10-CM | POA: Diagnosis not present

## 2017-03-01 DIAGNOSIS — N401 Enlarged prostate with lower urinary tract symptoms: Secondary | ICD-10-CM | POA: Diagnosis not present

## 2017-03-02 DIAGNOSIS — I951 Orthostatic hypotension: Secondary | ICD-10-CM | POA: Diagnosis not present

## 2017-03-02 DIAGNOSIS — I10 Essential (primary) hypertension: Secondary | ICD-10-CM | POA: Diagnosis not present

## 2017-03-02 DIAGNOSIS — G20 Parkinson's disease: Secondary | ICD-10-CM | POA: Diagnosis not present

## 2017-03-02 DIAGNOSIS — R1310 Dysphagia, unspecified: Secondary | ICD-10-CM | POA: Diagnosis not present

## 2017-03-02 DIAGNOSIS — N401 Enlarged prostate with lower urinary tract symptoms: Secondary | ICD-10-CM | POA: Diagnosis not present

## 2017-03-02 DIAGNOSIS — G3183 Dementia with Lewy bodies: Secondary | ICD-10-CM | POA: Diagnosis not present

## 2017-03-06 DIAGNOSIS — N401 Enlarged prostate with lower urinary tract symptoms: Secondary | ICD-10-CM | POA: Diagnosis not present

## 2017-03-06 DIAGNOSIS — I951 Orthostatic hypotension: Secondary | ICD-10-CM | POA: Diagnosis not present

## 2017-03-06 DIAGNOSIS — G20 Parkinson's disease: Secondary | ICD-10-CM | POA: Diagnosis not present

## 2017-03-06 DIAGNOSIS — G3183 Dementia with Lewy bodies: Secondary | ICD-10-CM | POA: Diagnosis not present

## 2017-03-06 DIAGNOSIS — R1310 Dysphagia, unspecified: Secondary | ICD-10-CM | POA: Diagnosis not present

## 2017-03-06 DIAGNOSIS — I10 Essential (primary) hypertension: Secondary | ICD-10-CM | POA: Diagnosis not present

## 2017-03-08 DIAGNOSIS — I10 Essential (primary) hypertension: Secondary | ICD-10-CM | POA: Diagnosis not present

## 2017-03-08 DIAGNOSIS — G3183 Dementia with Lewy bodies: Secondary | ICD-10-CM | POA: Diagnosis not present

## 2017-03-08 DIAGNOSIS — R1310 Dysphagia, unspecified: Secondary | ICD-10-CM | POA: Diagnosis not present

## 2017-03-08 DIAGNOSIS — G20 Parkinson's disease: Secondary | ICD-10-CM | POA: Diagnosis not present

## 2017-03-08 DIAGNOSIS — I951 Orthostatic hypotension: Secondary | ICD-10-CM | POA: Diagnosis not present

## 2017-03-08 DIAGNOSIS — N401 Enlarged prostate with lower urinary tract symptoms: Secondary | ICD-10-CM | POA: Diagnosis not present

## 2017-03-09 DIAGNOSIS — J309 Allergic rhinitis, unspecified: Secondary | ICD-10-CM | POA: Diagnosis not present

## 2017-03-09 DIAGNOSIS — G20 Parkinson's disease: Secondary | ICD-10-CM | POA: Diagnosis not present

## 2017-03-09 DIAGNOSIS — G3183 Dementia with Lewy bodies: Secondary | ICD-10-CM | POA: Diagnosis not present

## 2017-03-09 DIAGNOSIS — I10 Essential (primary) hypertension: Secondary | ICD-10-CM | POA: Diagnosis not present

## 2017-03-09 DIAGNOSIS — I951 Orthostatic hypotension: Secondary | ICD-10-CM | POA: Diagnosis not present

## 2017-03-09 DIAGNOSIS — R1310 Dysphagia, unspecified: Secondary | ICD-10-CM | POA: Diagnosis not present

## 2017-03-09 DIAGNOSIS — N401 Enlarged prostate with lower urinary tract symptoms: Secondary | ICD-10-CM | POA: Diagnosis not present

## 2017-03-13 DIAGNOSIS — G3183 Dementia with Lewy bodies: Secondary | ICD-10-CM | POA: Diagnosis not present

## 2017-03-13 DIAGNOSIS — I10 Essential (primary) hypertension: Secondary | ICD-10-CM | POA: Diagnosis not present

## 2017-03-13 DIAGNOSIS — R1310 Dysphagia, unspecified: Secondary | ICD-10-CM | POA: Diagnosis not present

## 2017-03-13 DIAGNOSIS — I951 Orthostatic hypotension: Secondary | ICD-10-CM | POA: Diagnosis not present

## 2017-03-13 DIAGNOSIS — N401 Enlarged prostate with lower urinary tract symptoms: Secondary | ICD-10-CM | POA: Diagnosis not present

## 2017-03-13 DIAGNOSIS — G20 Parkinson's disease: Secondary | ICD-10-CM | POA: Diagnosis not present

## 2017-03-15 DIAGNOSIS — I951 Orthostatic hypotension: Secondary | ICD-10-CM | POA: Diagnosis not present

## 2017-03-15 DIAGNOSIS — G3183 Dementia with Lewy bodies: Secondary | ICD-10-CM | POA: Diagnosis not present

## 2017-03-15 DIAGNOSIS — G20 Parkinson's disease: Secondary | ICD-10-CM | POA: Diagnosis not present

## 2017-03-15 DIAGNOSIS — R1310 Dysphagia, unspecified: Secondary | ICD-10-CM | POA: Diagnosis not present

## 2017-03-15 DIAGNOSIS — I10 Essential (primary) hypertension: Secondary | ICD-10-CM | POA: Diagnosis not present

## 2017-03-15 DIAGNOSIS — N401 Enlarged prostate with lower urinary tract symptoms: Secondary | ICD-10-CM | POA: Diagnosis not present

## 2017-03-16 DIAGNOSIS — R1310 Dysphagia, unspecified: Secondary | ICD-10-CM | POA: Diagnosis not present

## 2017-03-16 DIAGNOSIS — G20 Parkinson's disease: Secondary | ICD-10-CM | POA: Diagnosis not present

## 2017-03-16 DIAGNOSIS — G3183 Dementia with Lewy bodies: Secondary | ICD-10-CM | POA: Diagnosis not present

## 2017-03-16 DIAGNOSIS — N401 Enlarged prostate with lower urinary tract symptoms: Secondary | ICD-10-CM | POA: Diagnosis not present

## 2017-03-16 DIAGNOSIS — I10 Essential (primary) hypertension: Secondary | ICD-10-CM | POA: Diagnosis not present

## 2017-03-16 DIAGNOSIS — I951 Orthostatic hypotension: Secondary | ICD-10-CM | POA: Diagnosis not present

## 2017-03-20 ENCOUNTER — Encounter: Payer: Self-pay | Admitting: Family

## 2017-03-20 ENCOUNTER — Non-Acute Institutional Stay (SKILLED_NURSING_FACILITY): Payer: Medicare Other | Admitting: Family

## 2017-03-20 DIAGNOSIS — I10 Essential (primary) hypertension: Secondary | ICD-10-CM | POA: Diagnosis not present

## 2017-03-20 DIAGNOSIS — G3183 Dementia with Lewy bodies: Secondary | ICD-10-CM | POA: Diagnosis not present

## 2017-03-20 DIAGNOSIS — I951 Orthostatic hypotension: Secondary | ICD-10-CM | POA: Diagnosis not present

## 2017-03-20 DIAGNOSIS — G20 Parkinson's disease: Secondary | ICD-10-CM | POA: Diagnosis not present

## 2017-03-20 DIAGNOSIS — L602 Onychogryphosis: Secondary | ICD-10-CM

## 2017-03-20 DIAGNOSIS — H6121 Impacted cerumen, right ear: Secondary | ICD-10-CM

## 2017-03-20 DIAGNOSIS — R683 Clubbing of fingers: Secondary | ICD-10-CM

## 2017-03-20 DIAGNOSIS — N401 Enlarged prostate with lower urinary tract symptoms: Secondary | ICD-10-CM | POA: Diagnosis not present

## 2017-03-20 DIAGNOSIS — R1310 Dysphagia, unspecified: Secondary | ICD-10-CM | POA: Diagnosis not present

## 2017-03-20 NOTE — Progress Notes (Signed)
Location:  [place] of Service:  SNF (31) Provider: Thecla Forgione FNP-C  Blanchie Serve, MD  Patient Care Team: Blanchie Serve, MD as PCP - General (Internal Medicine) Irine Seal, MD as Attending Physician (Urology) Charlotte Crumb, MD as Consulting Physician (Orthopedic Surgery)  Extended Emergency Contact Information Primary Emergency Contact: Mosso,Norma Address: [address]           [place], [place] [address] [place] Phone: [phone] Relation: Spouse Secondary Emergency Contact: [name],Lois  [place] [employer] Phone: [phone] Relation: Daughter  Code Status:  DNR Goals of care: Advanced Directive information Advanced Directives 03/20/2017  Does Patient Have a Medical Advance Directive? Yes  Type of Paramedic of [place];Out of facility DNR (pink MOST or yellow form);Living will  Does patient want to make changes to medical advance directive? -  Copy of [place] in Chart? Yes  Would patient like information on creating a medical advance directive? -  Pre-existing out of facility DNR order (yellow form or pink MOST form) Yellow form placed in chart (order not valid for inpatient use);Pink MOST form placed in chart (order not valid for inpatient use)     Chief Complaint  Patient presents with  . Acute Visit    problems hearing    HPI:  Pt is a 84 y.o. male seen today at [facility] for an acute visit for evaluation of hearing problems.He is seen in his room with patient's wife and sitter at bedside.[name] reported patient's seems to be having problems hearing.Patient unable to provide HPI and ROS during visit sitter stated patient woke up early this morning so has been sleepy.He follows commands during visit. Also spoke with Patient's daughter Lucita Ferrara during visit who was contacted by sitter.Patient's daughter concerned about patient's drooling  states on last visit with Neuro Botox injection under the tongue was recommended.Patient currently on Hospice service will discuss with Hospice if Botox injection are covered. Patient's daughter would also like dermatology referral to trip fingernail. No fever or chills reported.facility Nurse states no new concerns.    Past Medical History:  Diagnosis Date  . Benign enlargement of prostate   . Chronic constipation 02/26/2014  . Contusion of forehead 03/09/2014  . Depression   . Dizziness   . Fracture, rib   . Gait disturbance   . History of fall 03/09/2014  . Hypertension   . Memory difficulties 07/20/2014  . Memory loss   . Orthostatic hypotension   . Osteoporosis   . Parkinson disease [facility])    Past Surgical History:  Procedure Laterality Date  . APPENDECTOMY    . CATARACT EXTRACTION Left   . HERNIA REPAIR    . TONSILLECTOMY      Allergies  Allergen Reactions  . Myrbetriq [Mirabegron] Nausea And Vomiting  . Stalevo [Carbidopa-Levodopa-Entacapone] Diarrhea    GI upset    Outpatient Encounter Medications as of 03/20/2017  Medication Sig  . acetaminophen (TYLENOL) 500 MG tablet Take 500 mg by mouth 3 (three) times daily.  . AMITIZA 24 MCG capsule TAKE 1 CAPSULE TWICE DAILY WITH MEALS.  Marland Kitchen aspirin EC 81 MG tablet Take 81 mg by mouth at bedtime.  Marland Kitchen buPROPion (WELLBUTRIN SR) 100 MG 12 hr tablet Take 1 tablet (100 mg total) by mouth daily.  . carbamide peroxide (DEBROX) 6.5 % OTIC solution Place 5 drops into both ears as needed.  . carbidopa-levodopa (SINEMET) 25-250 MG tablet Take 1 tablet  by mouth 4 (four) times daily.  Marland Kitchen docusate sodium (COLACE) 100 MG capsule Take 200 mg by mouth at bedtime.   Marland Kitchen LORazepam (ATIVAN) 0.5 MG tablet Take 0.5 mg by mouth at bedtime.  Marland Kitchen LORazepam (ATIVAN) 0.5 MG tablet Take 0.5 mg by mouth every 8 (eight) hours as needed for anxiety.   . OXYGEN Inhale 2 L into the lungs continuous.  . pramipexole (MIRAPEX) 0.75 MG tablet TAKE 1 TABLET BY MOUTH 3  TIMES A DAY.  . Probiotic Product (ALIGN PO) Take 1 capsule by mouth daily.  . Pseudoephedrine-Guaifenesin (MUCINEX D MAX STRENGTH PO) Take 600 mg by mouth 2 (two) times daily.  Marland Kitchen pyridostigmine (MESTINON) 60 MG tablet Take 0.5 tablets (30 mg total) by mouth every morning.  Marland Kitchen QUEtiapine (SEROQUEL) 25 MG tablet Take 25 mg by mouth at bedtime.  . ranitidine (ZANTAC) 75 MG tablet Take 75 mg by mouth daily.   Marland Kitchen triamcinolone (NASACORT) 55 MCG/ACT AERO nasal inhaler Place 2 sprays into the nose daily as needed (allergies).   . [DISCONTINUED] guaiFENesin (ROBITUSSIN) 100 MG/5ML liquid Take 10 mLs by mouth 2 (two) times daily.   No facility-administered encounter medications on file as of 03/20/2017.     Review of Systems  Unable to perform ROS: Dementia (additional information provided by patient's POA,sitter and facility Nurse )  Constitutional: Negative for chills and fever.  HENT: Positive for drooling and hearing loss. Negative for congestion, rhinorrhea, sinus pressure, sinus pain and sneezing.   Eyes: Negative for discharge, redness and itching.  Respiratory: Negative for chest tightness, shortness of breath and wheezing.        Chronic cough   Cardiovascular: Negative for chest pain, palpitations and leg swelling.  Gastrointestinal: Negative for abdominal distention, abdominal pain, constipation, diarrhea, nausea and vomiting.  Genitourinary: Negative for dysuria, flank pain and urgency.  Musculoskeletal: Positive for gait problem.  Skin: Negative for color change, pallor and rash.       Clubbed Fingernail continues with Epsom salt soaking twice daily though sitter reports patient doesn't cooperative to immerse thumb in the Epsom salt.  Psychiatric/Behavioral: Positive for confusion. Negative for agitation and sleep disturbance. The patient is not nervous/anxious.     Immunization History  Administered Date(s) Administered  . Influenza, High Dose Seasonal PF 10/16/2016  .  Influenza-Unspecified 10/11/2015  . Pneumococcal Conjugate-13 06/30/2013  . Pneumococcal Polysaccharide-23 02/25/2004, 01/25/2017  . Tdap 07/04/2016  . Zoster 10/17/2004   Pertinent  Health Maintenance Due  Topic Date Due  . INFLUENZA VACCINE  Completed  . PNA vac Low Risk Adult  Completed   Fall Risk  01/31/2017 03/23/2016 03/13/2014  Falls in the past year? Yes Yes Yes  Number falls in past yr: 2 or more 1 2 or more  Injury with Fall? Yes Yes Yes  Risk Factor Category  - - High Fall Risk  Risk for fall due to : - History of fall(s) History of fall(s);Impaired balance/gait;Impaired mobility;Other (Comment)  Risk for fall due to: Comment - - Instability related to parkinsonism  Follow up - - Falls evaluation completed;Education provided;Falls prevention discussed    Vitals:   03/20/17 1036  BP: 120/76  Pulse: 70  Resp: 20  Temp: (!) 97.5 F (36.4 C)  SpO2: 96%  Weight: 131 lb 1.6 oz (59.5 kg)  Height: 6' (1.829 m)   Body mass index is 17.78 kg/m. Physical Exam  Constitutional:  Thin built tall elderly in no acute distress.sleepy during visit but response verbally.   HENT:  Head: Normocephalic.  Left Ear: External ear normal.  Mouth/Throat: Oropharynx is clear and moist. No oropharyngeal exudate.  Right ear cerumen impaction unable to visualize TM   Eyes: Conjunctivae are normal. Pupils are equal, round, and reactive to light. Right eye exhibits no discharge. Left eye exhibits no discharge. No scleral icterus.  Eye glasses in place   Neck: Neck supple. No JVD present. No thyromegaly present.  Cardiovascular: Normal rate, regular rhythm, normal heart sounds and intact distal pulses. Exam reveals no gallop and no friction rub.  No murmur heard. Pulmonary/Chest: Effort normal and breath sounds normal. No respiratory distress. He has no wheezes. He has no rales.  Abdominal: Soft. Bowel sounds are normal. He exhibits no distension. There is no tenderness. There is no rebound and  no guarding.  Musculoskeletal: He exhibits no edema or tenderness.  Unsteady gait uses FWW and wheelchair.   Lymphadenopathy:    He has no cervical adenopathy.  Neurological: He is alert.  Follows commands during visit but sleepy.   Skin: Skin is warm and dry. No rash noted. No erythema. No pallor.  Fingernails clubbing to both hands previous skin redness has improved.New fingernail growing underneath clubbed nails.old dry blood noted on right fingernail.None tender to touch.overgrown fingernail to left hand.   Psychiatric: He has a normal mood and affect.    Labs reviewed: Recent Labs    07/04/16 1038 07/05/16 0237  NA 133* 134*  K 4.0 4.0  CL 99* 104  CO2 27 24  GLUCOSE 119* 87  BUN 19 17  CREATININE 0.89 0.80  CALCIUM 8.5* 7.9*    Recent Labs    07/04/16 1038 07/05/16 0237  WBC 7.1 6.2  NEUTROABS 5.6  --   HGB 11.1* 10.6*  HCT 33.1* 31.1*  MCV 86.9 85.9  PLT 230 205   Lab Results  Component Value Date   TSH 1.225 07/04/2016   Lab Results  Component Value Date   HGBA1C 5.6 07/04/2016    Significant Diagnostic Results in last 30 days:  No results found.  Assessment/Plan 1. Impacted cerumen of right ear Right ear cerumen impaction unable to visualize TM.No tenderness to touch.Apply Debrox 6.5 % otic drops 5 gtt to right ear twice daily x 4 days then lavage with warm water.continue to monitor.   2. Clubbing of fingers Afebrile.Fingernails clubbing to both hands previous skin redness has improved.New fingernail growing underneath clubbed nails.old dry blood noted on right fingernail.None tender to touch.overgrown fingernail to left hand. Refer to dermatology to trim overgrown fingernail per Patient's daughter request.Continue Epsom salt soak twice daily.   3. Overgrown fingernails Left hand fingernails overgrown.Refer to dermatology to trim overgrown fingernail per Patient's daughter request.   Family/ staff Communication: Reviewed plan of care with  patient,patient's wife,Daughter Lucita Ferrara and facility Nurse.  Labs/tests ordered: Refer to dermatology to trim overgrown fingernail    Sandrea Hughs, NP

## 2017-03-21 ENCOUNTER — Telehealth: Payer: Self-pay | Admitting: *Deleted

## 2017-03-21 ENCOUNTER — Telehealth: Payer: Self-pay

## 2017-03-21 DIAGNOSIS — D692 Other nonthrombocytopenic purpura: Secondary | ICD-10-CM | POA: Diagnosis not present

## 2017-03-21 DIAGNOSIS — L03019 Cellulitis of unspecified finger: Secondary | ICD-10-CM | POA: Diagnosis not present

## 2017-03-21 DIAGNOSIS — L814 Other melanin hyperpigmentation: Secondary | ICD-10-CM | POA: Diagnosis not present

## 2017-03-21 NOTE — Telephone Encounter (Signed)
Faxed signed orders re: wellbutrin SR po 100mg  daily, sinemet 25-250mg  tablet po 4 times daily"  Back to [facility]/palliative [facility] at [phone]. Received fax confirmation.

## 2017-03-21 NOTE — Telephone Encounter (Signed)
Dr. Bubba Camp is scheduled to see Brian Aguirre on 03/26/17. Patient's daughter has some concerns Dr. Bubba Camp would like for the daughter to be present during the visit. Attempted to call Kathrynn Speed but was unable to get in contact with her so I left her a voicemail to call me back here at [facility] down in the clinic.

## 2017-03-23 ENCOUNTER — Encounter: Payer: Self-pay | Admitting: Internal Medicine

## 2017-03-28 ENCOUNTER — Encounter: Payer: Self-pay | Admitting: Internal Medicine

## 2017-03-28 ENCOUNTER — Non-Acute Institutional Stay (SKILLED_NURSING_FACILITY): Payer: Medicare Other | Admitting: Internal Medicine

## 2017-03-28 DIAGNOSIS — G3183 Dementia with Lewy bodies: Secondary | ICD-10-CM | POA: Diagnosis not present

## 2017-03-28 DIAGNOSIS — R1310 Dysphagia, unspecified: Secondary | ICD-10-CM | POA: Diagnosis not present

## 2017-03-28 DIAGNOSIS — J9611 Chronic respiratory failure with hypoxia: Secondary | ICD-10-CM

## 2017-03-28 DIAGNOSIS — R269 Unspecified abnormalities of gait and mobility: Secondary | ICD-10-CM | POA: Diagnosis not present

## 2017-03-28 DIAGNOSIS — I951 Orthostatic hypotension: Secondary | ICD-10-CM | POA: Diagnosis not present

## 2017-03-28 DIAGNOSIS — G20 Parkinson's disease: Secondary | ICD-10-CM

## 2017-03-28 DIAGNOSIS — F0281 Dementia in other diseases classified elsewhere with behavioral disturbance: Secondary | ICD-10-CM

## 2017-03-28 DIAGNOSIS — K5909 Other constipation: Secondary | ICD-10-CM

## 2017-03-28 DIAGNOSIS — E440 Moderate protein-calorie malnutrition: Secondary | ICD-10-CM

## 2017-03-28 DIAGNOSIS — I10 Essential (primary) hypertension: Secondary | ICD-10-CM | POA: Diagnosis not present

## 2017-03-28 DIAGNOSIS — M1990 Unspecified osteoarthritis, unspecified site: Secondary | ICD-10-CM | POA: Diagnosis not present

## 2017-03-28 DIAGNOSIS — K117 Disturbances of salivary secretion: Secondary | ICD-10-CM

## 2017-03-28 DIAGNOSIS — R05 Cough: Secondary | ICD-10-CM | POA: Diagnosis not present

## 2017-03-28 DIAGNOSIS — N401 Enlarged prostate with lower urinary tract symptoms: Secondary | ICD-10-CM | POA: Diagnosis not present

## 2017-03-28 DIAGNOSIS — K219 Gastro-esophageal reflux disease without esophagitis: Secondary | ICD-10-CM

## 2017-03-28 DIAGNOSIS — R059 Cough, unspecified: Secondary | ICD-10-CM

## 2017-03-28 NOTE — Progress Notes (Signed)
Location:  [facility] Room Number: 13 Place of Service:  SNF [phone]) Provider:  Blanchie Serve MD  Blanchie Serve, MD  Patient Care Team: Blanchie Serve, MD as PCP - General (Internal Medicine) Irine Seal, MD as Attending Physician (Urology) Charlotte Crumb, MD as Consulting Physician (Orthopedic Surgery)  Extended Emergency Contact Information Primary Emergency Contact: Thivierge,Norma Address: [address]          [place], [place] [address] [place] Phone: [phone] Relation: Spouse Secondary Emergency Contact: [name],Lois  [place] [employer] Phone: [phone] Relation: Daughter  Code Status:  DNR  Goals of care: Advanced Directive information Advanced Directives 03/28/2017  Does Patient Have a Medical Advance Directive? Yes  Type of Paramedic of [place];Living will;Out of facility DNR (pink MOST or yellow form)  Does patient want to make changes to medical advance directive? No - Patient declined  Copy of [place] in Chart? Yes  Would patient like information on creating a medical advance directive? -  Pre-existing out of facility DNR order (yellow form or pink MOST form) Yellow form placed in chart (order not valid for inpatient use);Pink MOST form placed in chart (order not valid for inpatient use)     Chief Complaint  Patient presents with  . Medical Management of Chronic Issues    Routine Visit     HPI:  Pt is a 84 y.o. male seen today for medical management of chronic diseases. He is seen in his room with his wife and personal care giver present. He has parkinson's disease with dementia and this limits his HPI and ROS. He is under hospice services.  gerd- currently on ranitidine 75 mg daily, tends to have cough that is most prominent in morning after getting up. Denies any heartburn or indigestion  Cough- refuses mucinex at times. Has coughing with meals  and in the morning that are most prominent per family. Cough has increased in last few weeks with increased drooling of saliva noted by family and staff.   OA- on tylenol 500 mg tid, denies pain this visit. Needs assistance with transfers and is currently working with personal trainer to help strengthen gait and balance.  Dementia with behavioral disturbance- takes seroquel 25 mg daily and bupropion 100 mg daily, per staff and family has episodes of agitation and frustration, refuses care at times. currenttly on ativam 0.5 mg qhs and additional q8h prn dosing.   Constipation- chronic problem, on colace 200 mg daily and amitiza bid at present.  Parkinson's disease- takes sinemet 25-250 mg qid with pramipexole 0.75 mg tid and pyridostigmine 30 mg daily. Mobility is limited, uses wheelchair for ambulation except for walker with therapy team. Memory has declined. Some episodes of drop in blood pressure with position change has also been reported. Also has had trouble with swallowing.   Pulmonary hypertension- with need to use o2 by nasal canula and also on nasocort inhaler.   Protein calorie malnutrition- currently on feeding supplement, feeds himself, takes 25% of his meals.    Past Medical History:  Diagnosis Date  . Benign enlargement of prostate   . Chronic constipation 02/26/2014  . Contusion of forehead 03/09/2014  . Depression   . Dizziness   . Fracture, rib   . Gait disturbance   . History of fall 03/09/2014  . Hypertension   . Memory difficulties 07/20/2014  . Memory loss   . Orthostatic hypotension   . Osteoporosis   .  Parkinson disease [facility])    Past Surgical History:  Procedure Laterality Date  . APPENDECTOMY    . CATARACT EXTRACTION Left   . HERNIA REPAIR    . TONSILLECTOMY      Allergies  Allergen Reactions  . Myrbetriq [Mirabegron] Nausea And Vomiting  . Stalevo [Carbidopa-Levodopa-Entacapone] Diarrhea    GI upset    Outpatient Encounter Medications as of  03/28/2017  Medication Sig  . acetaminophen (TYLENOL) 500 MG tablet Take 500 mg by mouth 3 (three) times daily.  . AMITIZA 24 MCG capsule TAKE 1 CAPSULE TWICE DAILY WITH MEALS.  Marland Kitchen aspirin EC 81 MG tablet Take 81 mg by mouth at bedtime.  Marland Kitchen buPROPion (WELLBUTRIN SR) 100 MG 12 hr tablet Take 1 tablet (100 mg total) by mouth daily.  . carbamide peroxide (DEBROX) 6.5 % OTIC solution Place 5 drops into both ears as needed.  . carbidopa-levodopa (SINEMET) 25-250 MG tablet Take 1 tablet by mouth 4 (four) times daily.  Marland Kitchen docusate sodium (COLACE) 100 MG capsule Take 200 mg by mouth at bedtime.   . feeding supplement (BOOST HIGH PROTEIN) LIQD Take 1 Container by mouth 2 (two) times daily.  Marland Kitchen LORazepam (ATIVAN) 0.5 MG tablet Take 0.5 mg by mouth at bedtime.  Marland Kitchen LORazepam (ATIVAN) 0.5 MG tablet Take 0.5 mg by mouth every 8 (eight) hours as needed for anxiety.   . mupirocin ointment (BACTROBAN) 2 % Apply 1 application topically 3 (three) times daily. Stop date 04/01/17  . NON FORMULARY Take by mouth daily. 1 glass of Gatorade  . OXYGEN Inhale 2 L into the lungs continuous.  . pramipexole (MIRAPEX) 0.75 MG tablet TAKE 1 TABLET BY MOUTH 3 TIMES A DAY.  . Probiotic Product (ALIGN PO) Take 1 capsule by mouth daily.  . Pseudoephedrine-Guaifenesin (MUCINEX D MAX STRENGTH PO) Take 600 mg by mouth 2 (two) times daily.  Marland Kitchen pyridostigmine (MESTINON) 60 MG tablet Take 0.5 tablets (30 mg total) by mouth every morning.  Marland Kitchen QUEtiapine (SEROQUEL) 25 MG tablet Take 25 mg by mouth at bedtime.  . ranitidine (ZANTAC) 75 MG tablet Take 75 mg by mouth daily.   Marland Kitchen triamcinolone (NASACORT) 55 MCG/ACT AERO nasal inhaler Place 2 sprays into the nose daily as needed (allergies).    No facility-administered encounter medications on file as of 03/28/2017.     Review of Systems  Constitutional: Positive for appetite change and fatigue. Negative for chills and fever.  HENT: Negative for congestion, ear pain, hearing loss, mouth sores,  rhinorrhea, sore throat and trouble swallowing.   Eyes: Positive for visual disturbance.  Respiratory: Positive for cough and shortness of breath. Negative for choking.   Cardiovascular: Negative for chest pain.  Gastrointestinal: Positive for constipation. Negative for abdominal pain, diarrhea, nausea and vomiting.  Genitourinary: Negative for dysuria and hematuria.       Uses condom catheter  Musculoskeletal: Positive for arthralgias and gait problem. Negative for back pain.  Skin: Negative for rash.  Neurological: Positive for weakness. Negative for dizziness, seizures, syncope, light-headedness and headaches.  Psychiatric/Behavioral: Positive for behavioral problems and confusion. The patient is nervous/anxious.     Immunization History  Administered Date(s) Administered  . Influenza, High Dose Seasonal PF 10/16/2016  . Influenza-Unspecified 10/11/2015  . Pneumococcal Conjugate-13 06/30/2013  . Pneumococcal Polysaccharide-23 02/25/2004, 01/25/2017  . Tdap 07/04/2016  . Zoster 10/17/2004   Pertinent  Health Maintenance Due  Topic Date Due  . INFLUENZA VACCINE  Completed  . PNA vac Low Risk Adult  Completed  Fall Risk  01/31/2017 03/23/2016 03/13/2014  Falls in the past year? Yes Yes Yes  Number falls in past yr: 2 or more 1 2 or more  Injury with Fall? Yes Yes Yes  Risk Factor Category  - - High Fall Risk  Risk for fall due to : - History of fall(s) History of fall(s);Impaired balance/gait;Impaired mobility;Other (Comment)  Risk for fall due to: Comment - - Instability related to parkinsonism  Follow up - - Falls evaluation completed;Education provided;Falls prevention discussed   Functional Status Survey:    Vitals:   03/28/17 1449  BP: (!) 142/78  Pulse: 61  Resp: 18  Temp: (!) 96.5 F (35.8 C)  TempSrc: Oral  SpO2: 96%  Weight: 131 lb 1.6 oz (59.5 kg)  Height: 6' (1.829 m)   Body mass index is 17.78 kg/m.   Wt Readings from Last 3 Encounters:  03/28/17 131 lb  1.6 oz (59.5 kg)  03/20/17 131 lb 1.6 oz (59.5 kg)  02/21/17 131 lb 1.6 oz (59.5 kg)   Physical Exam  Constitutional: No distress.  Frail, elderly male in no acute distress  HENT:  Head: Normocephalic and atraumatic.  Right Ear: External ear normal.  Left Ear: External ear normal.  Nose: Nose normal.  Mouth/Throat: Oropharynx is clear and moist. No oropharyngeal exudate.  Drooling saliva   Eyes: Conjunctivae and EOM are normal. Pupils are equal, round, and reactive to light. Right eye exhibits no discharge. Left eye exhibits no discharge. No scleral icterus.  Has corrective glasses  Neck: Normal range of motion. Neck supple. No thyromegaly present.  Cardiovascular: Normal rate, regular rhythm and intact distal pulses.  Pulmonary/Chest: Effort normal and breath sounds normal. No respiratory distress. He has no wheezes. He has no rales.  On 2 liters oxygen by nasal canula  Abdominal: Soft. Bowel sounds are normal. There is no tenderness. There is no guarding.  Musculoskeletal: He exhibits deformity. He exhibits no edema.  Able to move all 4 extremities, weakness more prominent to lower extremities, strength 3/5 to LE and 5/5 to UE.   Neurological: He is alert.  Oriented to person and place, not to time, has slurred speech not coherent at times, has some word finding difficulty and low tone of the voice. Mask like face  Skin: Skin is warm and dry. No rash noted. He is not diaphoretic.    Labs reviewed: Recent Labs    07/04/16 1038 07/05/16 0237  NA 133* 134*  K 4.0 4.0  CL 99* 104  CO2 27 24  GLUCOSE 119* 87  BUN 19 17  CREATININE 0.89 0.80  CALCIUM 8.5* 7.9*   No results for input(s): AST, ALT, ALKPHOS, BILITOT, PROT, ALBUMIN in the last 8760 hours. Recent Labs    07/04/16 1038 07/05/16 0237  WBC 7.1 6.2  NEUTROABS 5.6  --   HGB 11.1* 10.6*  HCT 33.1* 31.1*  MCV 86.9 85.9  PLT 230 205   Lab Results  Component Value Date   TSH 1.225 07/04/2016   Lab Results    Component Value Date   HGBA1C 5.6 07/04/2016   No results found for: CHOL, HDL, LDLCALC, LDLDIRECT, TRIG, CHOLHDL  Significant Diagnostic Results in last 30 days:  No results found.  Assessment/Plan  1. Chronic respiratory failure with hypoxia (HCC) Continue oxygen by nasal canula, supportive care  2. Gastroesophageal reflux disease without esophagitis Discontinue ranitidine. Start omeprazole 20 mg daily for now in am  3. Chronic constipation Continue amitiza and stool softener, maintain hydration  4. Parkinson disease ([place]) Continue sinemet 25-250 mg qid with pramipexole 0.75 mg tid and pyridostigmine 30 mg daily. Reviewed Dr Jannifer Franklin note from 02/2017  5. Abnormality of gait High fall risk. Wheelchair for ambulation and walker only with trainer for now.   6. Dementia due to Parkinson's disease with behavioral disturbance ([place]) Continue wellbutrin and seroquel for now. Supportive care and ativan current regimen. Hospice service on board.   7. Osteoarthritis, unspecified osteoarthritis type, unspecified site Continue tylenol 500 mg tid, continue to work with Physiological scientist as tolerated. Fall precautions. Remains a fall risk.   8. Cough Concern of GERD and dysphagia with muscle weakness contributing. PPI as above. Aspiration precautions.   9. Moderate protein-calorie malnutrition ([place]) feeding supplement to be continued, assist with feeding if needed. Aspiration precautions. Decline anticipated.  10. Salivary secretion Increased drooling noted affecting quality of his life. Try scopolamine patch 1 mg q3 days for now and reassess.    Family/ staff Communication: reviewed care plan with patient, his wife and his daughter Lucita Ferrara over the phone and charge nurse.    Labs/tests ordered:  none   Blanchie Serve, MD Internal Medicine [facility] [address] [place], [place] [address] Cell Phone (Monday-Friday 8 am - 5 pm): [phone] On  Call: [phone] and follow prompts after 5 pm and on weekends Office Phone: [phone] Office Fax: [phone]

## 2017-03-30 DIAGNOSIS — R1310 Dysphagia, unspecified: Secondary | ICD-10-CM | POA: Diagnosis not present

## 2017-03-30 DIAGNOSIS — G20 Parkinson's disease: Secondary | ICD-10-CM | POA: Diagnosis not present

## 2017-03-30 DIAGNOSIS — N401 Enlarged prostate with lower urinary tract symptoms: Secondary | ICD-10-CM | POA: Diagnosis not present

## 2017-03-30 DIAGNOSIS — I10 Essential (primary) hypertension: Secondary | ICD-10-CM | POA: Diagnosis not present

## 2017-03-30 DIAGNOSIS — G3183 Dementia with Lewy bodies: Secondary | ICD-10-CM | POA: Diagnosis not present

## 2017-03-30 DIAGNOSIS — I951 Orthostatic hypotension: Secondary | ICD-10-CM | POA: Diagnosis not present

## 2017-04-02 DIAGNOSIS — R1310 Dysphagia, unspecified: Secondary | ICD-10-CM | POA: Diagnosis not present

## 2017-04-02 DIAGNOSIS — I10 Essential (primary) hypertension: Secondary | ICD-10-CM | POA: Diagnosis not present

## 2017-04-02 DIAGNOSIS — G3183 Dementia with Lewy bodies: Secondary | ICD-10-CM | POA: Diagnosis not present

## 2017-04-02 DIAGNOSIS — N401 Enlarged prostate with lower urinary tract symptoms: Secondary | ICD-10-CM | POA: Diagnosis not present

## 2017-04-02 DIAGNOSIS — I951 Orthostatic hypotension: Secondary | ICD-10-CM | POA: Diagnosis not present

## 2017-04-02 DIAGNOSIS — G20 Parkinson's disease: Secondary | ICD-10-CM | POA: Diagnosis not present

## 2017-04-03 DIAGNOSIS — I951 Orthostatic hypotension: Secondary | ICD-10-CM | POA: Diagnosis not present

## 2017-04-03 DIAGNOSIS — N401 Enlarged prostate with lower urinary tract symptoms: Secondary | ICD-10-CM | POA: Diagnosis not present

## 2017-04-03 DIAGNOSIS — I10 Essential (primary) hypertension: Secondary | ICD-10-CM | POA: Diagnosis not present

## 2017-04-03 DIAGNOSIS — R1310 Dysphagia, unspecified: Secondary | ICD-10-CM | POA: Diagnosis not present

## 2017-04-03 DIAGNOSIS — G3183 Dementia with Lewy bodies: Secondary | ICD-10-CM | POA: Diagnosis not present

## 2017-04-03 DIAGNOSIS — G20 Parkinson's disease: Secondary | ICD-10-CM | POA: Diagnosis not present

## 2017-04-05 ENCOUNTER — Encounter: Payer: Self-pay | Admitting: Family

## 2017-04-05 ENCOUNTER — Non-Acute Institutional Stay (SKILLED_NURSING_FACILITY): Payer: Medicare Other | Admitting: Family

## 2017-04-05 DIAGNOSIS — G20 Parkinson's disease: Secondary | ICD-10-CM | POA: Diagnosis not present

## 2017-04-05 NOTE — Progress Notes (Signed)
Location:  [place] Room Number: 13 Place of Service:  SNF (31) Provider: Dinah Ngetich FNP-C  Blanchie Serve, MD  Patient Care Team: Blanchie Serve, MD as PCP - General (Internal Medicine) Irine Seal, MD as Attending Physician (Urology) Charlotte Crumb, MD as Consulting Physician (Orthopedic Surgery)  Extended Emergency Contact Information Primary Emergency Contact: Inocencio,Norma Address: [address]          [place], [place] [address] [place] Phone: [phone] Relation: Spouse Secondary Emergency Contact: [name],Lois  [place] [employer] Phone: [phone] Relation: Daughter  Code Status:  DNR Goals of care: Advanced Directive information Advanced Directives 04/05/2017  Does Patient Have a Medical Advance Directive? Yes  Type of Paramedic of [place];Out of facility DNR (pink MOST or yellow form);Living will  Does patient want to make changes to medical advance directive? -  Copy of [place] in Chart? Yes  Would patient like information on creating a medical advance directive? -  Pre-existing out of facility DNR order (yellow form or pink MOST form) Pink MOST form placed in chart (order not valid for inpatient use);Yellow form placed in chart (order not valid for inpatient use)     Chief Complaint  Patient presents with  . Acute Visit    ? seizure yesterday per patient's wife     HPI:  Pt is a 84 y.o. male seen today at [facility] for an acute visit for evaluation of possible seizure per patient's wife report.He is seen in his room today with wife,sitter,POA,and Nurse at bedside.Patient Nurse reports wife stated patient had a seizure activity yesterday during lunch time.She states patient leaned back on wheelchair and was unresponsive,stiff and jerking hands.Also patient has been fidgety and reaching out in the air.Patient was assessed by Nurse no  seizure activity noted. Patient seen today up on wheelchairHe is slow to answer question but understood.He denies any acute issues during visit.Sitter states no new concerns.Previous finger infections have improved with vinegar soaks.   Past Medical History:  Diagnosis Date  . Benign enlargement of prostate   . Chronic constipation 02/26/2014  . Contusion of forehead 03/09/2014  . Depression   . Dizziness   . Fracture, rib   . Gait disturbance   . History of fall 03/09/2014  . Hypertension   . Memory difficulties 07/20/2014  . Memory loss   . Orthostatic hypotension   . Osteoporosis   . Parkinson disease [facility])    Past Surgical History:  Procedure Laterality Date  . APPENDECTOMY    . CATARACT EXTRACTION Left   . HERNIA REPAIR    . TONSILLECTOMY      Allergies  Allergen Reactions  . Myrbetriq [Mirabegron] Nausea And Vomiting  . Stalevo [Carbidopa-Levodopa-Entacapone] Diarrhea    GI upset    Outpatient Encounter Medications as of 04/05/2017  Medication Sig  . acetaminophen (TYLENOL) 500 MG tablet Take 500 mg by mouth 3 (three) times daily.  . AMITIZA 24 MCG capsule TAKE 1 CAPSULE TWICE DAILY WITH MEALS.  Marland Kitchen aspirin EC 81 MG tablet Take 81 mg by mouth at bedtime.  Marland Kitchen buPROPion (WELLBUTRIN SR) 100 MG 12 hr tablet Take 1 tablet (100 mg total) by mouth daily.  . carbidopa-levodopa (SINEMET) 25-250 MG tablet Take 1 tablet by mouth 4 (four) times daily.  Marland Kitchen docusate sodium (COLACE) 100 MG capsule Take 200 mg by mouth at bedtime.   . feeding supplement (BOOST HIGH PROTEIN) LIQD Take  1 Container by mouth 2 (two) times daily.  Marland Kitchen guaiFENesin (MUCINEX MAXIMUM STRENGTH PO) Take 600 mg by mouth 2 (two) times daily.  Marland Kitchen LORazepam (ATIVAN) 0.5 MG tablet Take 0.5 mg by mouth at bedtime.  Marland Kitchen LORazepam (ATIVAN) 0.5 MG tablet Take 0.5 mg by mouth every 8 (eight) hours as needed for anxiety.   . NON FORMULARY Take by mouth daily. 1 glass of Gatorade  . omeprazole (PRILOSEC) 20 MG capsule Take 20 mg  by mouth daily.  . OXYGEN Inhale 2 L into the lungs continuous.  . pramipexole (MIRAPEX) 0.75 MG tablet TAKE 1 TABLET BY MOUTH 3 TIMES A DAY.  . Probiotic Product (ALIGN PO) Take 1 capsule by mouth daily.  . Pseudoephedrine-Guaifenesin (MUCINEX D MAX STRENGTH PO) Take 600 mg by mouth 2 (two) times daily.  Marland Kitchen pyridostigmine (MESTINON) 60 MG tablet Take 0.5 tablets (30 mg total) by mouth every morning.  Marland Kitchen QUEtiapine (SEROQUEL) 25 MG tablet Take 25 mg by mouth at bedtime.  . triamcinolone (NASACORT) 55 MCG/ACT AERO nasal inhaler Place 2 sprays into the nose daily as needed (allergies).   . [DISCONTINUED] carbamide peroxide (DEBROX) 6.5 % OTIC solution Place 5 drops into both ears as needed.  . [DISCONTINUED] mupirocin ointment (BACTROBAN) 2 % Apply 1 application topically 3 (three) times daily. Stop date 04/01/17  . [DISCONTINUED] ranitidine (ZANTAC) 75 MG tablet Take 75 mg by mouth daily.    No facility-administered encounter medications on file as of 04/05/2017.     Review of Systems  Constitutional: Negative for appetite change, chills and fever.  HENT: Negative for congestion, rhinorrhea, sinus pressure, sinus pain, sneezing and sore throat.   Eyes: Positive for visual disturbance.       Wears eye glasses  Respiratory: Negative for chest tightness, shortness of breath and wheezing.        Chronic cough on oxygen   Cardiovascular: Negative for chest pain, palpitations and leg swelling.  Gastrointestinal: Negative for abdominal distention, abdominal pain, constipation, diarrhea, nausea and vomiting.  Endocrine: Negative for cold intolerance, heat intolerance, polydipsia, polyphagia and polyuria.  Genitourinary: Negative for dysuria, flank pain and urgency.  Musculoskeletal: Positive for gait problem.  Skin: Negative for color change, pallor, rash and wound.  Neurological: Negative for dizziness, syncope, light-headedness and headaches.       Seizure activity per HPI   Hematological:  Bruises/bleeds easily.  Psychiatric/Behavioral: Positive for confusion. Negative for agitation and sleep disturbance. The patient is not nervous/anxious.     Immunization History  Administered Date(s) Administered  . Influenza, High Dose Seasonal PF 10/16/2016  . Influenza-Unspecified 10/11/2015  . Pneumococcal Conjugate-13 06/30/2013  . Pneumococcal Polysaccharide-23 02/25/2004, 01/25/2017  . Tdap 07/04/2016  . Zoster 10/17/2004   Pertinent  Health Maintenance Due  Topic Date Due  . INFLUENZA VACCINE  Completed  . PNA vac Low Risk Adult  Completed   Fall Risk  01/31/2017 03/23/2016 03/13/2014  Falls in the past year? Yes Yes Yes  Number falls in past yr: 2 or more 1 2 or more  Injury with Fall? Yes Yes Yes  Risk Factor Category  - - High Fall Risk  Risk for fall due to : - History of fall(s) History of fall(s);Impaired balance/gait;Impaired mobility;Other (Comment)  Risk for fall due to: Comment - - Instability related to parkinsonism  Follow up - - Falls evaluation completed;Education provided;Falls prevention discussed    Vitals:   04/05/17 1205  BP: 130/72  Pulse: (!) 57  Resp: 18  Temp: Marland Kitchen)  96.9 F (36.1 C)  SpO2: 96%  Weight: 131 lb 1.6 oz (59.5 kg)  Height: 6' (1.829 m)   Body mass index is 17.78 kg/m. Physical Exam  Constitutional:  Tal thin built elderly in no acute distress   HENT:  Head: Normocephalic.  Mouth/Throat: Oropharynx is clear and moist. No oropharyngeal exudate.  Eyes: Pupils are equal, round, and reactive to light. Conjunctivae and EOM are normal. Right eye exhibits no discharge. Left eye exhibits no discharge. No scleral icterus.  Eye glasses in place   Neck: Normal range of motion. No JVD present. No thyromegaly present.  Cardiovascular: Normal rate, regular rhythm, normal heart sounds and intact distal pulses. Exam reveals no gallop and no friction rub.  No murmur heard. Pulmonary/Chest: Effort normal and breath sounds normal. No respiratory  distress. He has no wheezes. He has no rales.  On room air during visit   Abdominal: Soft. Bowel sounds are normal. He exhibits no distension. There is no tenderness. There is no rebound and no guarding.  Lymphadenopathy:    He has no cervical adenopathy.  Neurological: He is alert. Gait abnormal.  Skin: Skin is warm and dry. No rash noted. No erythema. No pallor.  Previous redness on cuticles resolved.   Psychiatric: He has a normal mood and affect. His behavior is normal. Judgment normal.  Slow speech     Labs reviewed: Recent Labs    07/04/16 1038 07/05/16 0237  NA 133* 134*  K 4.0 4.0  CL 99* 104  CO2 27 24  GLUCOSE 119* 87  BUN 19 17  CREATININE 0.89 0.80  CALCIUM 8.5* 7.9*   No results for input(s): AST, ALT, ALKPHOS, BILITOT, PROT, ALBUMIN in the last 8760 hours. Recent Labs    07/04/16 1038 07/05/16 0237  WBC 7.1 6.2  NEUTROABS 5.6  --   HGB 11.1* 10.6*  HCT 33.1* 31.1*  MCV 86.9 85.9  PLT 230 205   Lab Results  Component Value Date   TSH 1.225 07/04/2016   Lab Results  Component Value Date   HGBA1C 5.6 07/04/2016   Significant Diagnostic Results in last 30 days:  No results found.  Assessment/Plan   Parkinson disease  [name] reported seizure like activity one day ago but none noticed by facility Nurse.Patient  alert sitting up on wheelchair and communicating more than previous visit.No acute issues this visit. Afebrile.Progressive decline with rigidity/stiffness expected with parkinson.will continue to monitor.continue on sinemet 25-250 mg tablet four times daily.continue to follow with Neurology.continue with Hospice service.    Family/ staff Communication: Reviewed plan of care with patient,patient's wife and facility Nurse.   Labs/tests ordered: None   Dinah C Ngetich, NP

## 2017-04-07 DIAGNOSIS — I10 Essential (primary) hypertension: Secondary | ICD-10-CM | POA: Diagnosis not present

## 2017-04-07 DIAGNOSIS — R5381 Other malaise: Secondary | ICD-10-CM | POA: Diagnosis not present

## 2017-04-07 DIAGNOSIS — G20 Parkinson's disease: Secondary | ICD-10-CM | POA: Diagnosis not present

## 2017-04-07 LAB — BASIC METABOLIC PANEL
BUN: 28 — AB (ref 4–21)
Creatinine: 0.6 (ref 0.6–1.3)
Glucose: 109
POTASSIUM: 4.8 (ref 3.4–5.3)
Sodium: 134 — AB (ref 137–147)

## 2017-04-07 LAB — HEPATIC FUNCTION PANEL
ALK PHOS: 111 (ref 25–125)
AST: 7 — AB (ref 14–40)
BILIRUBIN, TOTAL: 0.5

## 2017-04-07 LAB — CBC AND DIFFERENTIAL
HCT: 26 — AB (ref 41–53)
HEMOGLOBIN: 8.8 — AB (ref 13.5–17.5)
PLATELETS: 366 (ref 150–399)
WBC: 7.4

## 2017-04-09 ENCOUNTER — Encounter: Payer: Self-pay | Admitting: Internal Medicine

## 2017-04-09 ENCOUNTER — Non-Acute Institutional Stay (SKILLED_NURSING_FACILITY): Payer: Medicare Other | Admitting: Internal Medicine

## 2017-04-09 DIAGNOSIS — J309 Allergic rhinitis, unspecified: Secondary | ICD-10-CM | POA: Diagnosis not present

## 2017-04-09 DIAGNOSIS — G3183 Dementia with Lewy bodies: Secondary | ICD-10-CM | POA: Diagnosis not present

## 2017-04-09 DIAGNOSIS — R410 Disorientation, unspecified: Secondary | ICD-10-CM | POA: Diagnosis not present

## 2017-04-09 DIAGNOSIS — R1310 Dysphagia, unspecified: Secondary | ICD-10-CM | POA: Diagnosis not present

## 2017-04-09 DIAGNOSIS — R258 Other abnormal involuntary movements: Secondary | ICD-10-CM | POA: Diagnosis not present

## 2017-04-09 DIAGNOSIS — G20 Parkinson's disease: Secondary | ICD-10-CM | POA: Diagnosis not present

## 2017-04-09 DIAGNOSIS — I951 Orthostatic hypotension: Secondary | ICD-10-CM | POA: Diagnosis not present

## 2017-04-09 DIAGNOSIS — I10 Essential (primary) hypertension: Secondary | ICD-10-CM | POA: Diagnosis not present

## 2017-04-09 DIAGNOSIS — R82998 Other abnormal findings in urine: Secondary | ICD-10-CM

## 2017-04-09 DIAGNOSIS — N401 Enlarged prostate with lower urinary tract symptoms: Secondary | ICD-10-CM | POA: Diagnosis not present

## 2017-04-09 DIAGNOSIS — S0083XA Contusion of other part of head, initial encounter: Secondary | ICD-10-CM | POA: Diagnosis not present

## 2017-04-09 LAB — CMP 10231
Albumin: 2.6
CO2: 26
Calcium: 8.2
Chloride: 101
EGFR (Non-African Amer.): 89
GLOBULIN: 3.9
Total Protein: 6.5

## 2017-04-09 NOTE — Progress Notes (Signed)
Location:  [place] Room Number: 13 Place of Service:  SNF [phone]) Provider:  Blanchie Serve MD  Blanchie Serve, MD  Patient Care Team: Blanchie Serve, MD as PCP - General (Internal Medicine) Irine Seal, MD as Attending Physician (Urology) Charlotte Crumb, MD as Consulting Physician (Orthopedic Surgery)  Extended Emergency Contact Information Primary Emergency Contact: Marschke,Norma Address: [address]          [place], [place] [address] [place] Phone: [phone] Relation: Spouse Secondary Emergency Contact: [name],Lois  [place] [employer] Phone: [phone] Relation: Daughter  Code Status:  DNR  Goals of care: Advanced Directive information Advanced Directives 04/09/2017  Does Patient Have a Medical Advance Directive? Yes  Type of Advance Directive Living will;Healthcare Power of [place];Out of facility DNR (pink MOST or yellow form)  Does patient want to make changes to medical advance directive? No - Patient declined  Copy of [place] in Chart? Yes  Would patient like information on creating a medical advance directive? -  Pre-existing out of facility DNR order (yellow form or pink MOST form) Yellow form placed in chart (order not valid for inpatient use);Pink MOST form placed in chart (order not valid for inpatient use)     Chief Complaint  Patient presents with  . Acute Visit    bump on the head, confusion    HPI:  Pt is a 84 y.o. male seen today for acute visit. Over the weekend, patient bumped his head on the wall of the bathroom while being transferred by his aide. He has a contusion with slight bump on left forehead. He has been more sleepy and confused over the weekend per nursing note. Poor po intake that has been ongoing for sometime. There was concern of seizure like activity on 04/08/17 per patient's wife and per charge nurse on duty, patient was noticed to have 2 brief  episodes of stiffening, jerking and his eyes rolling backwards. During the witnessed episode, on call provider was not notified. Patient seen in his room today with charge nurse and patient's aide present. He mentions feeling better as day progresses. He says he is somewhat slow in the morning. Limited participation in conversation. Per charge nurse today, patient has been afebrile. He ate about 25% of his breakfast.   Past Medical History:  Diagnosis Date  . Benign enlargement of prostate   . Chronic constipation 02/26/2014  . Contusion of forehead 03/09/2014  . Depression   . Dizziness   . Fracture, rib   . Gait disturbance   . History of fall 03/09/2014  . Hypertension   . Memory difficulties 07/20/2014  . Memory loss   . Orthostatic hypotension   . Osteoporosis   . Parkinson disease [facility])    Past Surgical History:  Procedure Laterality Date  . APPENDECTOMY    . CATARACT EXTRACTION Left   . HERNIA REPAIR    . TONSILLECTOMY      Allergies  Allergen Reactions  . Myrbetriq [Mirabegron] Nausea And Vomiting  . Stalevo [Carbidopa-Levodopa-Entacapone] Diarrhea    GI upset    Outpatient Encounter Medications as of 04/09/2017  Medication Sig  . acetaminophen (TYLENOL) 325 MG tablet Take 650 mg by mouth every 4 (four) hours as needed.  Marland Kitchen acetaminophen (TYLENOL) 500 MG tablet Take 500 mg by mouth 3 (three) times daily.  . AMITIZA 24 MCG capsule TAKE 1 CAPSULE TWICE DAILY WITH MEALS.  Marland Kitchen aspirin EC 81 MG tablet Take  81 mg by mouth at bedtime.  Marland Kitchen buPROPion (WELLBUTRIN SR) 100 MG 12 hr tablet Take 1 tablet (100 mg total) by mouth daily.  . carbamide peroxide (DEBROX) 6.5 % OTIC solution Place 5 drops into both ears as needed.  . carbidopa-levodopa (SINEMET) 25-250 MG tablet Take 1 tablet by mouth 4 (four) times daily.  Marland Kitchen docusate sodium (COLACE) 100 MG capsule Take 200 mg by mouth at bedtime.   . feeding supplement (BOOST HIGH PROTEIN) LIQD Take 1 Container by mouth 2 (two) times daily.    Marland Kitchen LORazepam (ATIVAN) 0.5 MG tablet Take 0.5 mg by mouth at bedtime.  Marland Kitchen LORazepam (ATIVAN) 0.5 MG tablet Take 0.5 mg by mouth every 8 (eight) hours as needed for anxiety.   . NON FORMULARY Take by mouth daily. 1 glass of Gatorade  . omeprazole (PRILOSEC) 20 MG capsule Take 20 mg by mouth daily.  . OXYGEN Inhale 2 L into the lungs continuous.  . pramipexole (MIRAPEX) 0.75 MG tablet TAKE 1 TABLET BY MOUTH 3 TIMES A DAY.  . Probiotic Product (ALIGN PO) Take 1 capsule by mouth daily.  . Pseudoephedrine-Guaifenesin (MUCINEX D MAX STRENGTH PO) Take 600 mg by mouth 2 (two) times daily.  Marland Kitchen pyridostigmine (MESTINON) 60 MG tablet Take 0.5 tablets (30 mg total) by mouth every morning.  Marland Kitchen QUEtiapine (SEROQUEL) 25 MG tablet Take 25 mg by mouth at bedtime.  . triamcinolone (NASACORT) 55 MCG/ACT AERO nasal inhaler Place 2 sprays into the nose daily as needed (allergies).   . [DISCONTINUED] guaiFENesin (MUCINEX MAXIMUM STRENGTH PO) Take 600 mg by mouth 2 (two) times daily.   No facility-administered encounter medications on file as of 04/09/2017.     Review of Systems  Reason unable to perform ROS: obtained from charge nurse.  Constitutional: Negative for chills and fever.  Respiratory: Positive for cough and shortness of breath.        On chronic oxygen by nasal canula  Musculoskeletal:       No involuntary jerks, stiffening movement noted today per nursing and his aide  Psychiatric/Behavioral: Positive for confusion.    Immunization History  Administered Date(s) Administered  . Influenza, High Dose Seasonal PF 10/16/2016  . Influenza-Unspecified 10/11/2015  . Pneumococcal Conjugate-13 06/30/2013  . Pneumococcal Polysaccharide-23 02/25/2004, 01/25/2017  . Tdap 07/04/2016  . Zoster 10/17/2004   Pertinent  Health Maintenance Due  Topic Date Due  . INFLUENZA VACCINE  08/09/2017  . PNA vac Low Risk Adult  Completed   Fall Risk  01/31/2017 03/23/2016 03/13/2014  Falls in the past year? Yes Yes Yes   Number falls in past yr: 2 or more 1 2 or more  Injury with Fall? Yes Yes Yes  Risk Factor Category  - - High Fall Risk  Risk for fall due to : - History of fall(s) History of fall(s);Impaired balance/gait;Impaired mobility;Other (Comment)  Risk for fall due to: Comment - - Instability related to parkinsonism  Follow up - - Falls evaluation completed;Education provided;Falls prevention discussed   Functional Status Survey:    Vitals:   04/09/17 1123  BP: 120/60  Pulse: 84  Resp: 20  Temp: (!) 96.8 F (36 C)  TempSrc: Oral  SpO2: 97%  Weight: 131 lb 1.6 oz (59.5 kg)  Height: 6' (1.829 m)   Body mass index is 17.78 kg/m.   Wt Readings from Last 3 Encounters:  04/09/17 131 lb 1.6 oz (59.5 kg)  04/05/17 131 lb 1.6 oz (59.5 kg)  03/28/17 131 lb 1.6 oz (59.5  kg)   Physical Exam  Constitutional: No distress.  Frail, elderly, chronically ill appearing male  HENT:  Head: Normocephalic and atraumatic.  Right Ear: External ear normal.  Left Ear: External ear normal.  Nose: Nose normal.  Mouth/Throat: Oropharynx is clear and moist.  Eyes: Pupils are equal, round, and reactive to light. Conjunctivae and EOM are normal.  Corrective glasses  Neck: Normal range of motion. Neck supple.  Cardiovascular: Normal rate and regular rhythm.  Pulmonary/Chest: Effort normal and breath sounds normal.  Abdominal: Soft. Bowel sounds are normal. There is no tenderness.  Musculoskeletal: He exhibits no edema.  Able to move all 4 extremities, generalized weakness, needs 1-2 person assistance with transfers, mask like facial expression  Lymphadenopathy:    He has no cervical adenopathy.  Neurological: He is alert.  Unable to assess his orientation, hypotonia, weakness more prominent to LE  Skin: Skin is warm and dry. He is not diaphoretic.    Labs reviewed:   Recent Labs    07/04/16 1038 07/05/16 0237 04/07/17  NA 133* 134* 134*  K 4.0 4.0 4.8  CL 99* 104 101  CO2 27 24 26   GLUCOSE  119* 87  --   BUN 19 17 28*  CREATININE 0.89 0.80 0.6  CALCIUM 8.5* 7.9* 8.2   Recent Labs    04/07/17  AST 7*  ALKPHOS 111  PROT 6.5  ALBUMIN 2.6   Recent Labs    07/04/16 1038 07/05/16 0237 04/07/17  WBC 7.1 6.2 7.4  NEUTROABS 5.6  --   --   HGB 11.1* 10.6* 8.8*  HCT 33.1* 31.1* 26*  MCV 86.9 85.9  --   PLT 230 205 366   Lab Results  Component Value Date   TSH 1.225 07/04/2016   Lab Results  Component Value Date   HGBA1C 5.6 07/04/2016   No results found for: CHOL, HDL, LDLCALC, LDLDIRECT, TRIG, CHOLHDL  Significant Diagnostic Results in last 30 days:  No results found.  Assessment/Plan  Left forehead contusion Post trauma. Denies pain, supportive care, neurochecks. Fall precautions  Confusion Per aide and charge nurse, patient is at baseline today. Reviewed labs- no acute lyte imbalance, mild hyponatremia that appears chronic on lab review. Normal wbc. Has parkinson's with dementia, continue medication and supportive care. ? Post seizure per family with witnessed seizure like activity. Monitor clinically.   Jerky movement Unwitnessed, have advised family to call nursing staff if patient has another such episode. None at present. Concern of stiffness and rigidity secondary to parkinson's. Will refer to neurology service if recurs for seizure workup. No known history of seizure.   Dark urine Afebrile, no increased urinary frequency or pain reported. No blood in urine reported. Blood work not suggestive of infection. Advised to encourage hydration and perineal hygiene. Monitor clinically.    Family/ staff Communication: reviewed care plan with patient, his aide and charge nurse.    Labs/tests ordered:  none   Blanchie Serve, MD Internal Medicine [facility] [address] [place], [place] [address] Cell Phone (Monday-Friday 8 am - 5 pm): [phone] On Call: [phone] and follow prompts after 5 pm and on  weekends Office Phone: [phone] Office Fax: [phone]

## 2017-04-10 ENCOUNTER — Telehealth: Payer: Self-pay | Admitting: *Deleted

## 2017-04-10 NOTE — Telephone Encounter (Signed)
Patient's daughter called. She is asking to stop the prilosec and restart the zantac. She has read up on the prilosec and wonders if it possibly caused his seizure. With all of his other problems, she wants it stopped so as not to possibly exacerbate something else. She says the cough he has isn't only possibly caused by reflux but by many other factors. She can be reached at [phone] if you have any questions.

## 2017-04-10 NOTE — Telephone Encounter (Addendum)
Ok. Thanks. Family notified. Order on Dinah's desk for signature.

## 2017-04-10 NOTE — Telephone Encounter (Signed)
Ok to switch him back to zantac and stop the prilosec per family request. Please have Dinah write the order to resume previous dose of zantac and stop prilosec. Thanks.

## 2017-04-11 ENCOUNTER — Encounter: Payer: Self-pay | Admitting: Internal Medicine

## 2017-04-11 ENCOUNTER — Encounter: Payer: Self-pay | Admitting: Neurology

## 2017-04-11 DIAGNOSIS — G3183 Dementia with Lewy bodies: Secondary | ICD-10-CM | POA: Diagnosis not present

## 2017-04-11 DIAGNOSIS — N401 Enlarged prostate with lower urinary tract symptoms: Secondary | ICD-10-CM | POA: Diagnosis not present

## 2017-04-11 DIAGNOSIS — G20 Parkinson's disease: Secondary | ICD-10-CM | POA: Diagnosis not present

## 2017-04-11 DIAGNOSIS — R1310 Dysphagia, unspecified: Secondary | ICD-10-CM | POA: Diagnosis not present

## 2017-04-11 DIAGNOSIS — I10 Essential (primary) hypertension: Secondary | ICD-10-CM | POA: Diagnosis not present

## 2017-04-11 DIAGNOSIS — I951 Orthostatic hypotension: Secondary | ICD-10-CM | POA: Diagnosis not present

## 2017-04-13 ENCOUNTER — Non-Acute Institutional Stay (SKILLED_NURSING_FACILITY): Payer: Medicare Other | Admitting: Internal Medicine

## 2017-04-13 ENCOUNTER — Encounter: Payer: Self-pay | Admitting: Internal Medicine

## 2017-04-13 DIAGNOSIS — Z8679 Personal history of other diseases of the circulatory system: Secondary | ICD-10-CM | POA: Diagnosis not present

## 2017-04-13 DIAGNOSIS — Z7189 Other specified counseling: Secondary | ICD-10-CM | POA: Diagnosis not present

## 2017-04-13 DIAGNOSIS — G3183 Dementia with Lewy bodies: Secondary | ICD-10-CM | POA: Diagnosis not present

## 2017-04-13 DIAGNOSIS — R253 Fasciculation: Secondary | ICD-10-CM | POA: Diagnosis not present

## 2017-04-13 DIAGNOSIS — R1310 Dysphagia, unspecified: Secondary | ICD-10-CM

## 2017-04-13 DIAGNOSIS — I10 Essential (primary) hypertension: Secondary | ICD-10-CM | POA: Diagnosis not present

## 2017-04-13 DIAGNOSIS — G20 Parkinson's disease: Secondary | ICD-10-CM | POA: Diagnosis not present

## 2017-04-13 DIAGNOSIS — I951 Orthostatic hypotension: Secondary | ICD-10-CM | POA: Diagnosis not present

## 2017-04-13 DIAGNOSIS — R0600 Dyspnea, unspecified: Secondary | ICD-10-CM

## 2017-04-13 DIAGNOSIS — N401 Enlarged prostate with lower urinary tract symptoms: Secondary | ICD-10-CM | POA: Diagnosis not present

## 2017-04-13 NOTE — Telephone Encounter (Signed)
Please see additional Mychart message from patient's daughter.

## 2017-04-13 NOTE — Progress Notes (Signed)
Location:  [facility] Room Number: 13 Place of Service:  SNF [id number]) Provider:  Blanchie Serve, MD  Blanchie Serve, MD  Patient Care Team: Blanchie Serve, MD as PCP - General (Internal Medicine) Irine Seal, MD as Attending Physician (Urology) Charlotte Crumb, MD as Consulting Physician (Orthopedic Surgery)  Extended Emergency Contact Information Primary Emergency Contact: Rodier,Norma Address: [address]          [place], [place] [address] [place] Phone: [phone] Relation: Spouse Secondary Emergency Contact: [name],Lois  [place] [employer] Phone: [phone] Relation: Daughter  Code Status:  DNR  Goals of care: Advanced Directive information Advanced Directives 04/13/2017  Does Patient Have a Medical Advance Directive? Yes  Type of Paramedic of [place];Living will;Out of facility DNR (pink MOST or yellow form)  Does patient want to make changes to medical advance directive? No - Patient declined  Copy of [place] in Chart? Yes  Would patient like information on creating a medical advance directive? -  Pre-existing out of facility DNR order (yellow form or pink MOST form) Yellow form placed in chart (order not valid for inpatient use);Pink MOST form placed in chart (order not valid for inpatient use)     Chief Complaint  Patient presents with  . Acute Visit    Family Concerns    HPI:  Pt is a 84 y.o. male seen today for an acute visit for several concerns. Patient's daughter has sent me few email listing out several concerns.  1. Jerking movement- Daughter is concerned about patient having stiffening in his arms, eyes rolling backwards and him having jerking movement for 1-2 weeks. When asked further about time of occurrence, she mentions that her mother has noticed it few times and she has noticed it once or twice. She is not sure about exact time it lasts for  but thinks it is for few seconds. She thinks it has happened 3 times in total. She would like to know if it is seizure or parkinson's causing it.  2. Shortness of breath- Daughter is concerned about patient's breathing for a week, she mentions that it has worsened. When asked about it further, she mentions not to have noticed it by herself but has been notified by patient's aide. When I ask the aide, she says that patient was having some fast breathing yesterday afternoon only but not other time this week. Aide and nursing denies worsening of his chronic cough.  3. Difficulty swallowing- family noticed that patient took only 2 bites of chicken nuggets last night for dinner and a sip of milk shake. After that he took few more bites and sip but held them in his mouth and then spit them out. She is concerned about his swallowing. Nursing reports that patient has been refusing his medications several days this week starting 04/10/17. This morning he took the medication and held it in his mouth and spit it out. Last night, he took his medication. Per aide, he ate more than half of pimento cheese sandwich yesterday afternoon for lunch without problem.  4. Low blood pressure- he had BP of 98/50 on 04/11/17 at 11:04 am. Family is concerned about him being dehydrated, having renal failure and need for possible hydration through iv route. On chart review, his BP yesterday was 121/69, 120/70, 115/63 and BP on Wednesday evening was 121/71. Overall BP reading stable.  Patient is seen in his room today. He does not  participate in HPI and ROS. He appears in no acute distress. He requires 2 person assistance with transfer. He had a bowel movement this am. He has been afebrile. He ate about 5% of his breakfast this am. Reviewed his recent lab result and medication. Patient is currently under hospice services. He has parkinson's disease with dementia that has been progressing.    Past Medical History:  Diagnosis Date  . Benign  enlargement of prostate   . Chronic constipation 02/26/2014  . Contusion of forehead 03/09/2014  . Depression   . Dizziness   . Fracture, rib   . Gait disturbance   . History of fall 03/09/2014  . Hypertension   . Memory difficulties 07/20/2014  . Memory loss   . Orthostatic hypotension   . Osteoporosis   . Parkinson disease [facility])    Past Surgical History:  Procedure Laterality Date  . APPENDECTOMY    . CATARACT EXTRACTION Left   . HERNIA REPAIR    . TONSILLECTOMY      Allergies  Allergen Reactions  . Myrbetriq [Mirabegron] Nausea And Vomiting  . Stalevo [Carbidopa-Levodopa-Entacapone] Diarrhea    GI upset    Outpatient Encounter Medications as of 04/13/2017  Medication Sig  . acetaminophen (TYLENOL) 500 MG tablet Take 500 mg by mouth 3 (three) times daily.  . AMITIZA 24 MCG capsule TAKE 1 CAPSULE TWICE DAILY WITH MEALS.  Marland Kitchen aspirin EC 81 MG tablet Take 81 mg by mouth at bedtime.  Marland Kitchen buPROPion (WELLBUTRIN SR) 100 MG 12 hr tablet Take 1 tablet (100 mg total) by mouth daily.  . carbamide peroxide (DEBROX) 6.5 % OTIC solution Place 5 drops into both ears as needed.  . carbidopa-levodopa (SINEMET) 25-250 MG tablet Take 1 tablet by mouth 4 (four) times daily.  Marland Kitchen docusate sodium (COLACE) 100 MG capsule Take 200 mg by mouth at bedtime.   . feeding supplement (BOOST HIGH PROTEIN) LIQD Take 1 Container by mouth 2 (two) times daily.  Marland Kitchen LORazepam (ATIVAN) 0.5 MG tablet Take 0.5 mg by mouth at bedtime.  Marland Kitchen LORazepam (ATIVAN) 0.5 MG tablet Take 0.5 mg by mouth every 8 (eight) hours as needed for anxiety.   . NON FORMULARY Take by mouth daily. 1 glass of Gatorade  . OXYGEN Inhale 2 L into the lungs continuous.  . pramipexole (MIRAPEX) 0.75 MG tablet TAKE 1 TABLET BY MOUTH 3 TIMES A DAY.  . Probiotic Product (ALIGN PO) Take 1 capsule by mouth daily.  . Pseudoephedrine-Guaifenesin (MUCINEX D MAX STRENGTH PO) Take 600 mg by mouth 2 (two) times daily.  Marland Kitchen pyridostigmine (MESTINON) 60 MG tablet  Take 0.5 tablets (30 mg total) by mouth every morning.  Marland Kitchen QUEtiapine (SEROQUEL) 25 MG tablet Take 25 mg by mouth at bedtime.  . ranitidine (ZANTAC) 75 MG tablet Take 75 mg by mouth at bedtime.  . triamcinolone (NASACORT) 55 MCG/ACT AERO nasal inhaler Place 2 sprays into the nose daily as needed (allergies).   . [DISCONTINUED] acetaminophen (TYLENOL) 325 MG tablet Take 650 mg by mouth every 4 (four) hours as needed.  . [DISCONTINUED] omeprazole (PRILOSEC) 20 MG capsule Take 20 mg by mouth daily.   No facility-administered encounter medications on file as of 04/13/2017.     Review of Systems  Unable to perform ROS: Dementia  Constitutional: Positive for appetite change. Negative for chills and fever.  Respiratory: Positive for cough. Negative for shortness of breath.   Gastrointestinal: Negative for diarrhea and vomiting.  Musculoskeletal: Positive for gait problem.  Psychiatric/Behavioral: Positive  for confusion.    Immunization History  Administered Date(s) Administered  . Influenza, High Dose Seasonal PF 10/16/2016  . Influenza-Unspecified 10/11/2015  . Pneumococcal Conjugate-13 06/30/2013  . Pneumococcal Polysaccharide-23 02/25/2004, 01/25/2017  . Tdap 07/04/2016  . Zoster 10/17/2004   Pertinent  Health Maintenance Due  Topic Date Due  . INFLUENZA VACCINE  08/09/2017  . PNA vac Low Risk Adult  Completed   Fall Risk  01/31/2017 03/23/2016 03/13/2014  Falls in the past year? Yes Yes Yes  Number falls in past yr: 2 or more 1 2 or more  Injury with Fall? Yes Yes Yes  Risk Factor Category  - - High Fall Risk  Risk for fall due to : - History of fall(s) History of fall(s);Impaired balance/gait;Impaired mobility;Other (Comment)  Risk for fall due to: Comment - - Instability related to parkinsonism  Follow up - - Falls evaluation completed;Education provided;Falls prevention discussed   Functional Status Survey:    Vitals:   04/13/17 0915  BP: 121/69  Pulse: 90  Resp: 18  Temp:  (!) 97.2 F (36.2 C)  TempSrc: Oral  SpO2: 92%  Weight: 131 lb 1.6 oz (59.5 kg)  Height: 6' (1.829 m)   Body mass index is 17.78 kg/m.   Wt Readings from Last 3 Encounters:  04/13/17 131 lb 1.6 oz (59.5 kg)  04/09/17 131 lb 1.6 oz (59.5 kg)  04/05/17 131 lb 1.6 oz (59.5 kg)    Physical Exam  Constitutional: No distress.  Thin built, elderly, frail male  HENT:  Head: Normocephalic and atraumatic.  Does not open his mouth  Eyes: Conjunctivae are normal.  Neck: Neck supple.  Cardiovascular: Normal rate and regular rhythm.  Pulmonary/Chest: Effort normal and breath sounds normal. He has no wheezes. He has no rales.  Abdominal: Soft. Bowel sounds are normal. There is no tenderness. There is no guarding.  Musculoskeletal:  Needs assistance with transfer, on wheelchair and has to be wheeled around  Lymphadenopathy:    He has no cervical adenopathy.  Neurological:  Confused, unable to assess orientation, mask like expression  Skin: Skin is warm and dry. He is not diaphoretic.    Labs reviewed: Recent Labs    07/04/16 1038 07/05/16 0237 04/07/17  NA 133* 134* 134*  K 4.0 4.0 4.8  CL 99* 104 101  CO2 27 24 26   GLUCOSE 119* 87  --   BUN 19 17 28*  CREATININE 0.89 0.80 0.6  CALCIUM 8.5* 7.9* 8.2   Recent Labs    04/07/17  AST 7*  ALKPHOS 111  PROT 6.5  ALBUMIN 2.6   Recent Labs    07/04/16 1038 07/05/16 0237 04/07/17  WBC 7.1 6.2 7.4  NEUTROABS 5.6  --   --   HGB 11.1* 10.6* 8.8*  HCT 33.1* 31.1* 26*  MCV 86.9 85.9  --   PLT 230 205 366   Lab Results  Component Value Date   TSH 1.225 07/04/2016   Lab Results  Component Value Date   HGBA1C 5.6 07/04/2016   No results found for: CHOL, HDL, LDLCALC, LDLDIRECT, TRIG, CHOLHDL  Significant Diagnostic Results in last 30 days:  No results found.  Assessment/Plan  1. Jerking With episodes lasting for brief seconds per daughter, unlikely to be seizure. seroquel can cause some EPS but this does not appear  typical finding of this. Also this is not a new medication for him. Labs are stable. counselled daughter that we can refer him to neurology for seizure workup but she  does not want this at present.   2. Dyspnea, unspecified type None at present, he is at risk for aspiration with trouble swallowing and his parkinson's. SLP consult  3. History of hypotension One low reading this week, otherwise stable BP reading. He has had orthostasis in the past. Encouraged hydration for him. Supportive care for now. Did review with daughter that his parkinson's with autonomic dysfunction can also cause drop in blood pressure. Daughter voices understanding this and agrees with supportive care for now, BP this visit stable.   4. Dysphagia, unspecified type SLP consult to assess for need to change diet consistency, see if patient can follow directions for safe swallowing techinques but given his cognitive impairment and communication,t his can be challenging, daughter understands this. Aspiration precautions.   5. Goals of care, counseling/discussion Spoke with daughter Lucita Ferrara over the phone from 9:46 to 10:16 am and 1:16 pm to 1:25 pm. Answered her questions as above. Explained that most likely his parkinson's disease is progressing and with him refusing to take medications several times, the current medication might not have a desired effect on him and this can also cause worsening of his symptoms. His trouble swallowing could be a progression of parkinson's along with his deconditioning. Recommended discontinuing medication after SLP evaluation if patient continues to refuse them. This can help reduce pill burden and provide some quality of life. If his diet consistency gets changed and patient still refuses to eat, to consider liberalizing his diet to atleast let patient enjoy what he eats as long as family is aware about aspiration precautions and agrees to proceed. Daughter understands these and agree with them. She  would like to talk further with her mother and sister before finalising on the plan. I have recommended to set up a meeting with family members and health care team (nursing team, hospice team and myself) for review of further goals of care. Daughter agrees to work on a date.  For now, no changes to medications. SLP consult. Hospice physician updated on current clinical situation and plan. She will call patient's family to talk to them as well.     Family/ staff Communication: reviewed care plan with patient, his aide, his daughter Lucita Ferrara, hospice Physician and charge nurse. I spent 60 minutes in total this visit, face-to-face time with the patient, talking to daughter over the phone, consulting with hospice physician over the phone- more than than 50% of which was spent in counseling and coordination of care, reviewing test results, reviewing medication and discussing or reviewing the diagnosis and goals of care for the patient.     Labs/tests ordered:  none  Blanchie Serve, MD Internal Medicine [facility] [address] [place], [place] [address] Cell Phone (Monday-Friday 8 am - 5 pm): [phone] On Call: [phone] and follow prompts after 5 pm and on weekends Office Phone: [phone] Office Fax: [phone]

## 2017-04-14 DIAGNOSIS — I10 Essential (primary) hypertension: Secondary | ICD-10-CM | POA: Diagnosis not present

## 2017-04-14 DIAGNOSIS — G3183 Dementia with Lewy bodies: Secondary | ICD-10-CM | POA: Diagnosis not present

## 2017-04-14 DIAGNOSIS — I951 Orthostatic hypotension: Secondary | ICD-10-CM | POA: Diagnosis not present

## 2017-04-14 DIAGNOSIS — N401 Enlarged prostate with lower urinary tract symptoms: Secondary | ICD-10-CM | POA: Diagnosis not present

## 2017-04-14 DIAGNOSIS — G20 Parkinson's disease: Secondary | ICD-10-CM | POA: Diagnosis not present

## 2017-04-14 DIAGNOSIS — R1310 Dysphagia, unspecified: Secondary | ICD-10-CM | POA: Diagnosis not present

## 2017-04-15 DIAGNOSIS — R1310 Dysphagia, unspecified: Secondary | ICD-10-CM | POA: Diagnosis not present

## 2017-04-15 DIAGNOSIS — I951 Orthostatic hypotension: Secondary | ICD-10-CM | POA: Diagnosis not present

## 2017-04-15 DIAGNOSIS — I10 Essential (primary) hypertension: Secondary | ICD-10-CM | POA: Diagnosis not present

## 2017-04-15 DIAGNOSIS — G20 Parkinson's disease: Secondary | ICD-10-CM | POA: Diagnosis not present

## 2017-04-15 DIAGNOSIS — N401 Enlarged prostate with lower urinary tract symptoms: Secondary | ICD-10-CM | POA: Diagnosis not present

## 2017-04-15 DIAGNOSIS — G3183 Dementia with Lewy bodies: Secondary | ICD-10-CM | POA: Diagnosis not present

## 2017-04-19 ENCOUNTER — Telehealth: Payer: Self-pay | Admitting: Neurology

## 2017-04-19 NOTE — Telephone Encounter (Signed)
I called the wife.  The daughter had called earlier today looked to let me know that Brian Aguirre had passed away.  I appreciate the call.

## 2017-04-19 NOTE — Telephone Encounter (Signed)
FYI pt daughter just wanted to ensure that Dr Jannifer Franklin was made aware of the passing of pt.  She is not for a call back.

## 2017-04-19 NOTE — Telephone Encounter (Signed)
error 

## 2017-05-09 DEATH — deceased

## 2017-06-21 ENCOUNTER — Ambulatory Visit: Payer: Medicare Other | Admitting: Neurology

## 2018-06-23 IMAGING — CT CT HEAD W/O CM
3 of 8 series · 14 of 47 positions shown, 17 images · non-contrast
Comparison: 03/23/2014 head CT. 03/09/2014 head CT and cervical
spine CT.

CLINICAL DATA: 85-year-old male post fall after becoming dizzy and
lightheaded. Hit forehead. Denies loss of consciousness. Initial
encounter.

EXAM:
CT HEAD WITHOUT CONTRAST
CT CERVICAL SPINE WITHOUT CONTRAST
TECHNIQUE: Multidetector CT imaging of the head and cervical spine was
performed following the standard protocol without intravenous
contrast. Multiplanar CT image reconstructions of the cervical spine
were also generated.

[Series 5: coronal · coronal · 0.33mm/px · 3 of 68 slices shown]
[im 20/68  brain]
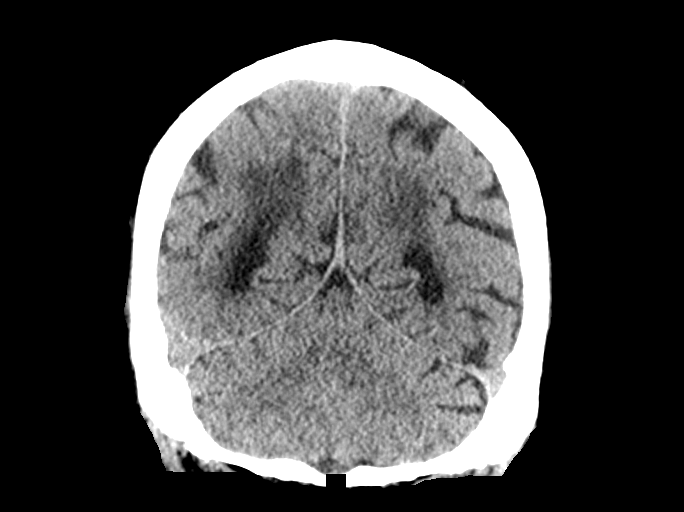
[im 29/68  brain]
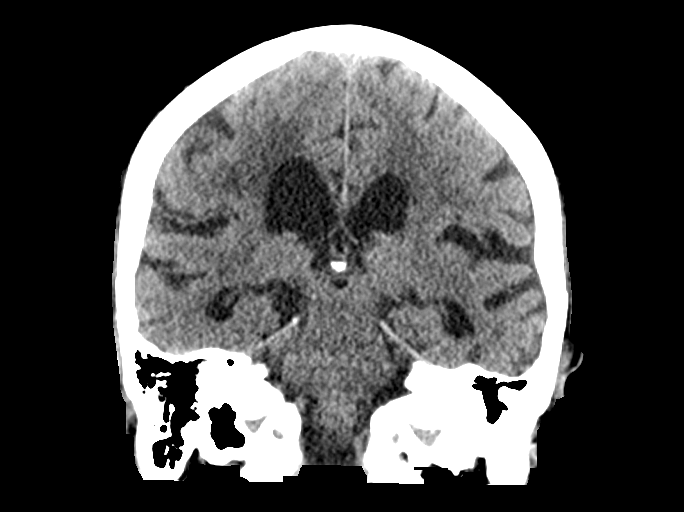
[im 39/68  brain]
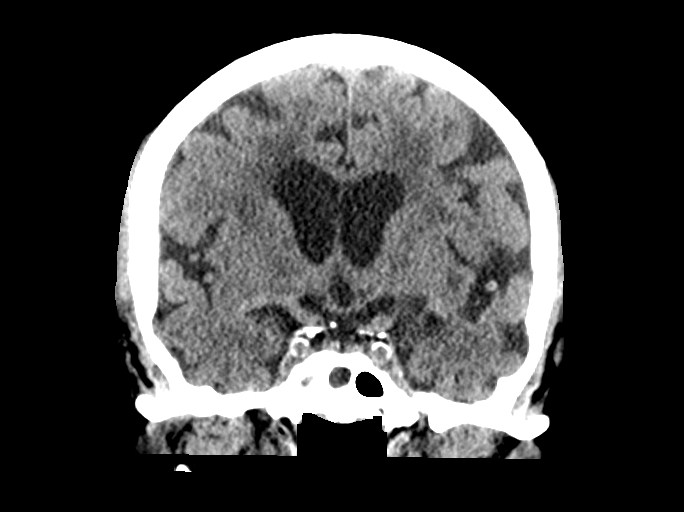

[Series 10: axial recon · axial · 0.25mm/px · z∈[-295,-127]mm · 9 of 115 slices shown, 12 images]
[im 11/115  brain]
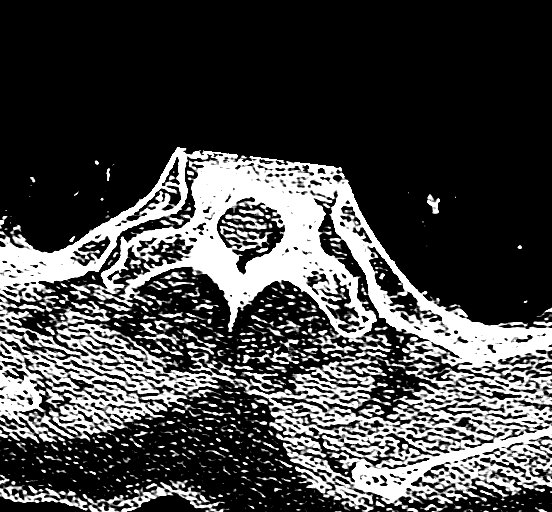
[im 11/115  bone]
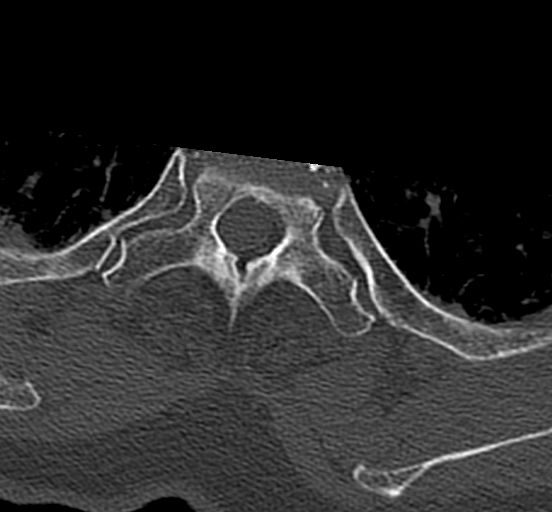
[im 21/115  brain]
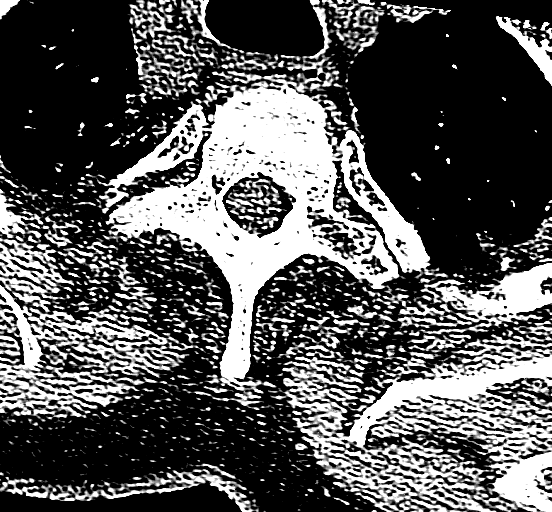
[im 32/115  brain]
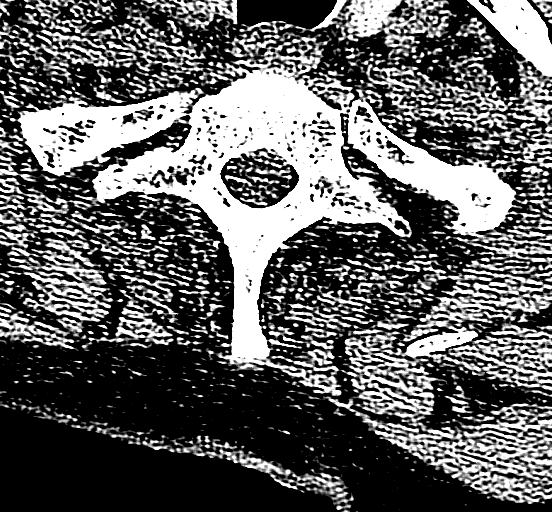
[im 42/115  brain]
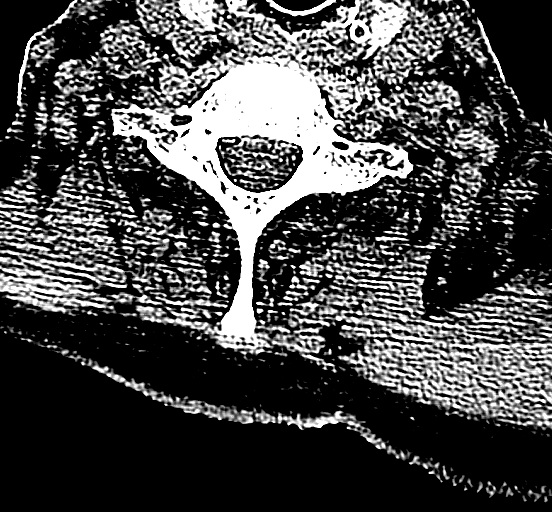
[im 63/115  brain]
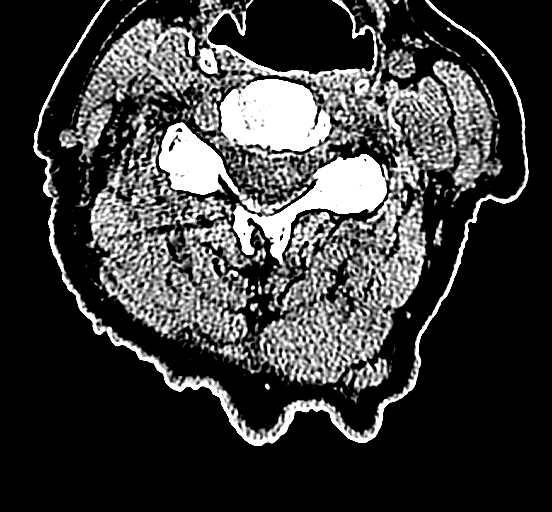
[im 63/115  bone]
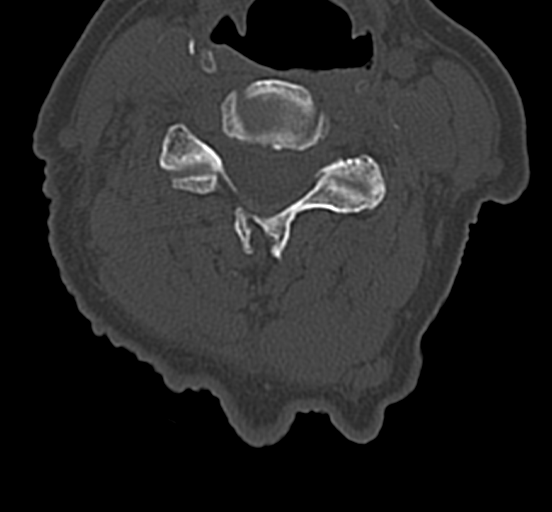
[im 73/115  brain]
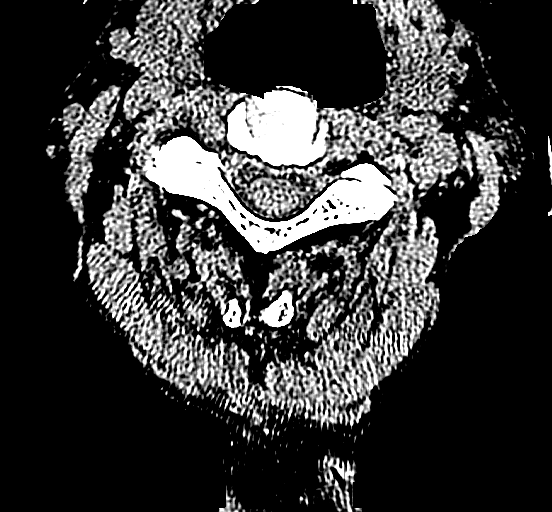
[im 83/115  brain]
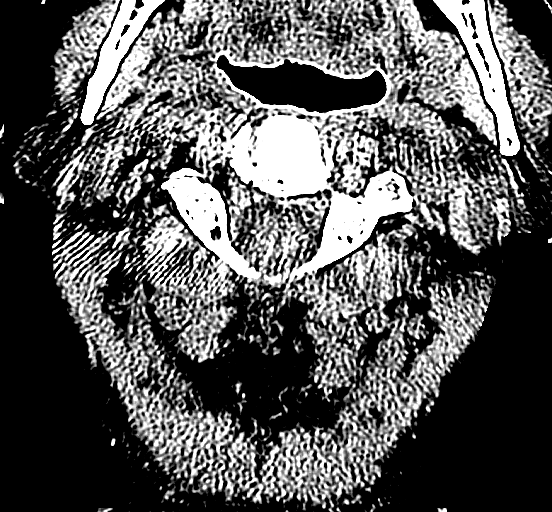
[im 94/115  brain]
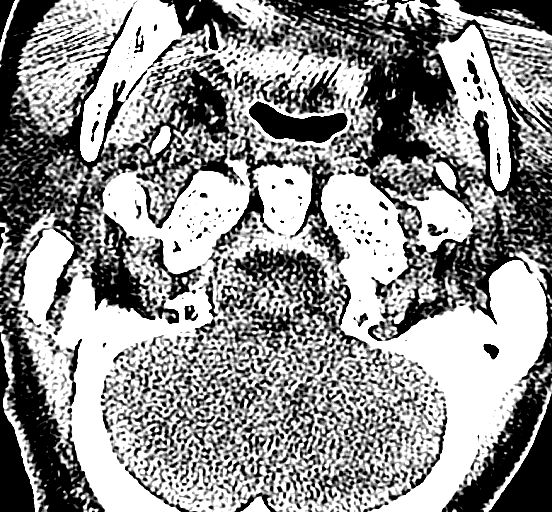
[im 104/115  brain]
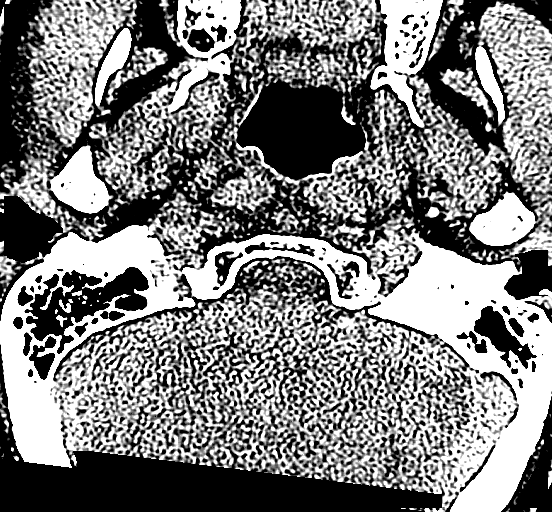
[im 104/115  bone]
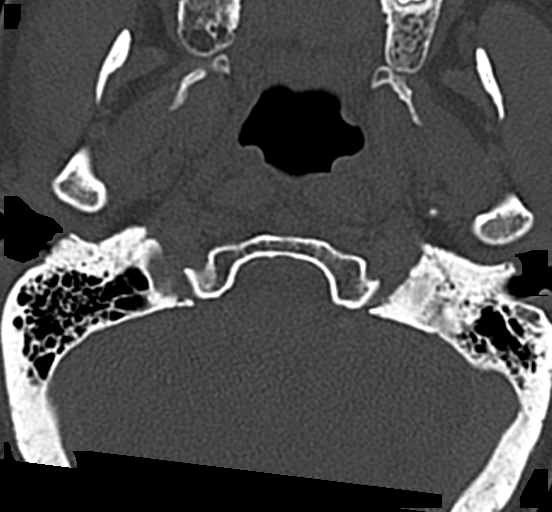

[Series 12: sagittal · sagittal · 0.39mm/px · 2 of 64 slices shown]
[im 22/64  brain]
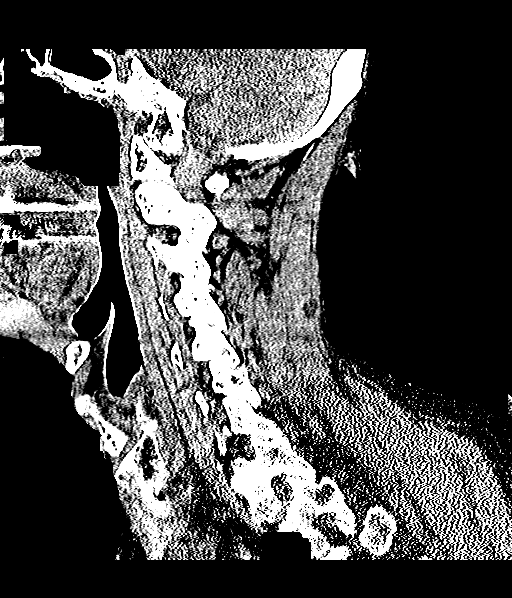
[im 43/64  brain]
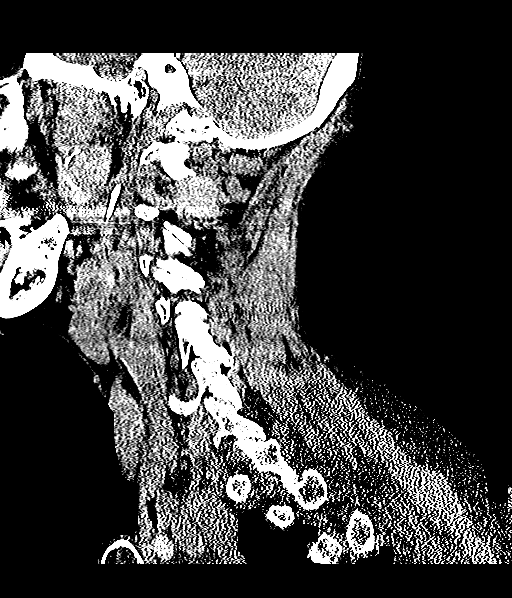

[14 of 47 positions shown; findings below may reference images not displayed]

FINDINGS: CT HEAD FINDINGS

Brain: No intracranial hemorrhage or CT evidence of large acute
infarct.

Prominent chronic microvascular changes.

Atrophy.

No intracranial mass lesion noted on this unenhanced exam.

Vascular: Vascular calcifications

Skull: No skull fracture

Sinuses/Orbits: Post lens replacement without acute orbital
abnormality. Mucosal thickening maxillary sinuses with small
air-fluid level. No orbital floor fracture noted. Opacification
ethmoid sinus air cells bilaterally. Mucosal thickening right
sphenoid sinus.

Other: Mastoid air cells and middle ear cavities are clear.

Right frontal-temporal region subcutaneous hematoma.

CT CERVICAL SPINE FINDINGS

Alignment: Normal alignment.

Skull base and vertebrae: No cervical spine fracture. Congenital
nonunion C1 ring once again noted.

Soft tissues and spinal canal: No abnormal prevertebral soft tissue
swelling. Cervical spondylotic changes most notable C5-6 and C6-7
with narrowing ventral thecal sac and minimal cord flattening.

Upper chest: Scarring lung apices.

Other: Negative
IMPRESSION: Right frontal-temporal region subcutaneous hematoma without
underlying fracture or intracranial hemorrhage.

Chronic microvascular changes.

Global atrophy.

Opacification/mucosal thickening maxillary sinuses, ethmoid sinus
air cells and right sphenoid sinus.

No cervical spine fracture, abnormal prevertebral soft tissue
swelling or malalignment.

Cervical spondylotic changes C5-6 and C6-7 with ventral thecal sac
narrowing and slight cord flattening without significant change.

## 2018-06-26 IMAGING — CR DG RIBS W/ CHEST 3+V*R*
3 series · 3 of 3 positions shown · non-contrast
Comparison: 03/09/2014 chest radiograph

CLINICAL DATA: Fell 3 days ago, RIGHT lower axillary rib pain,
history Parkinson's, hypertension

EXAM:
RIGHT RIBS AND CHEST - 3+ VIEW

[w chest pa]
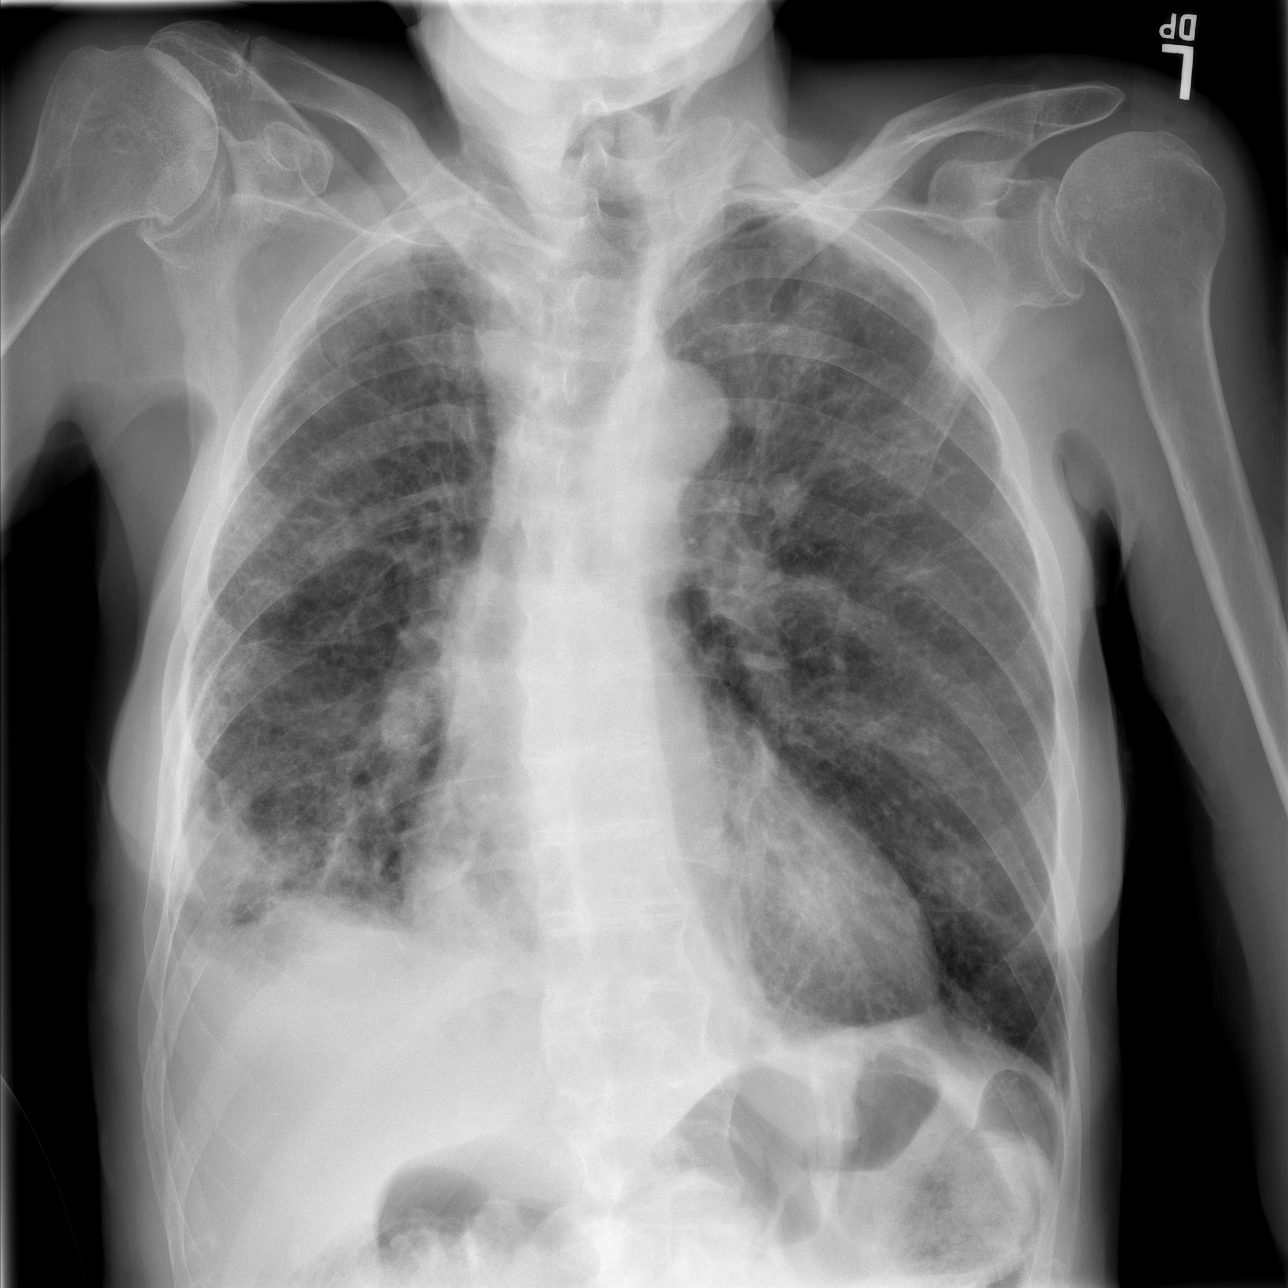

[w ribs ap/pa lower right *]
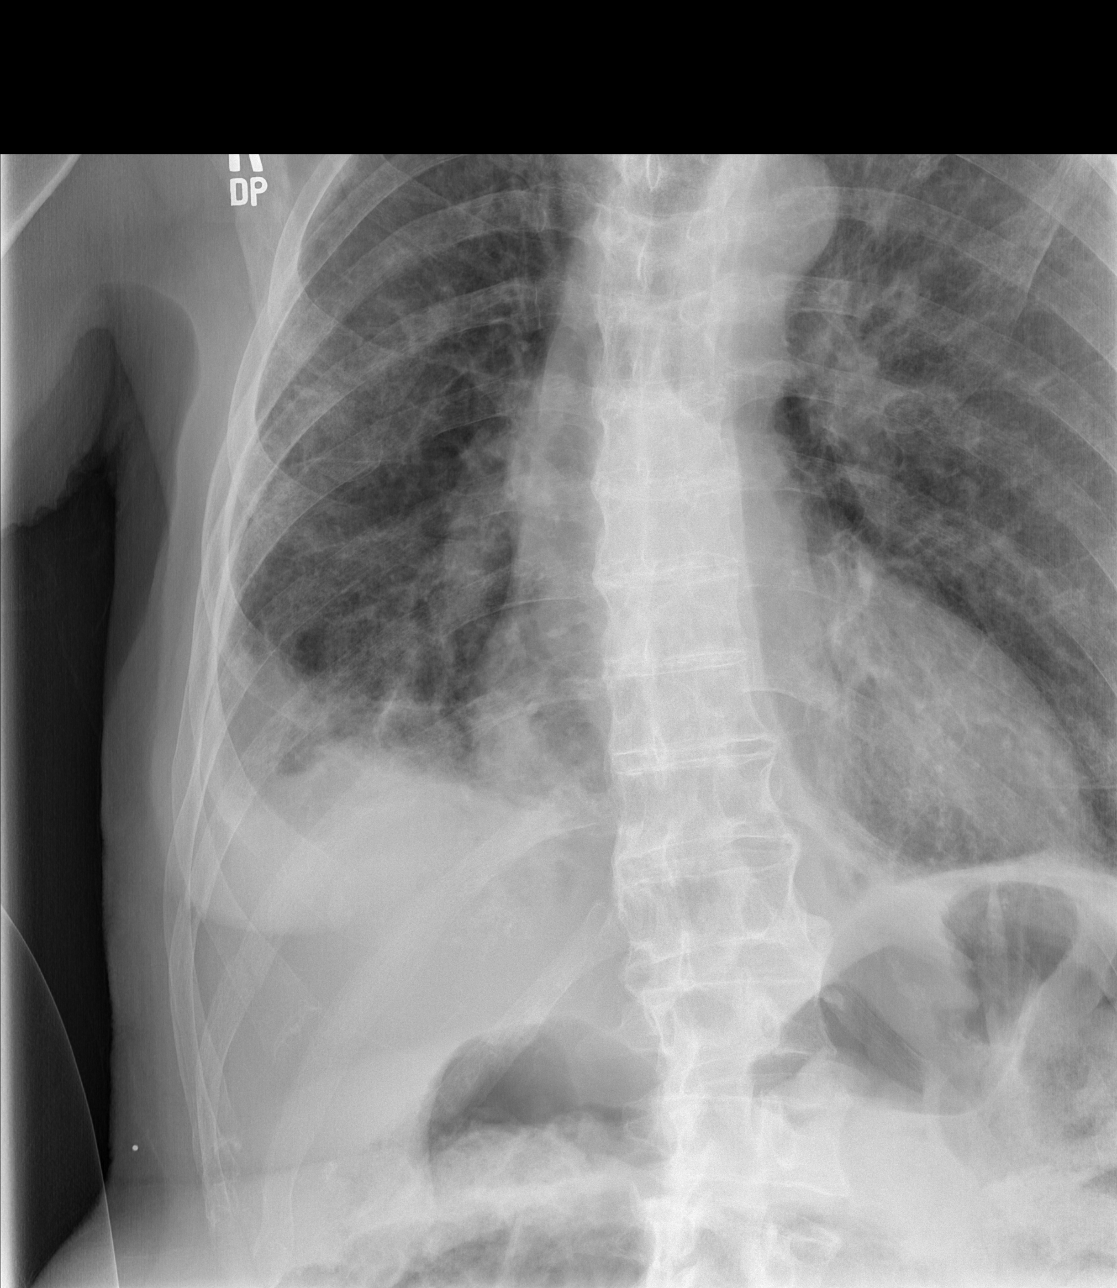

[w ribs oblique right *]
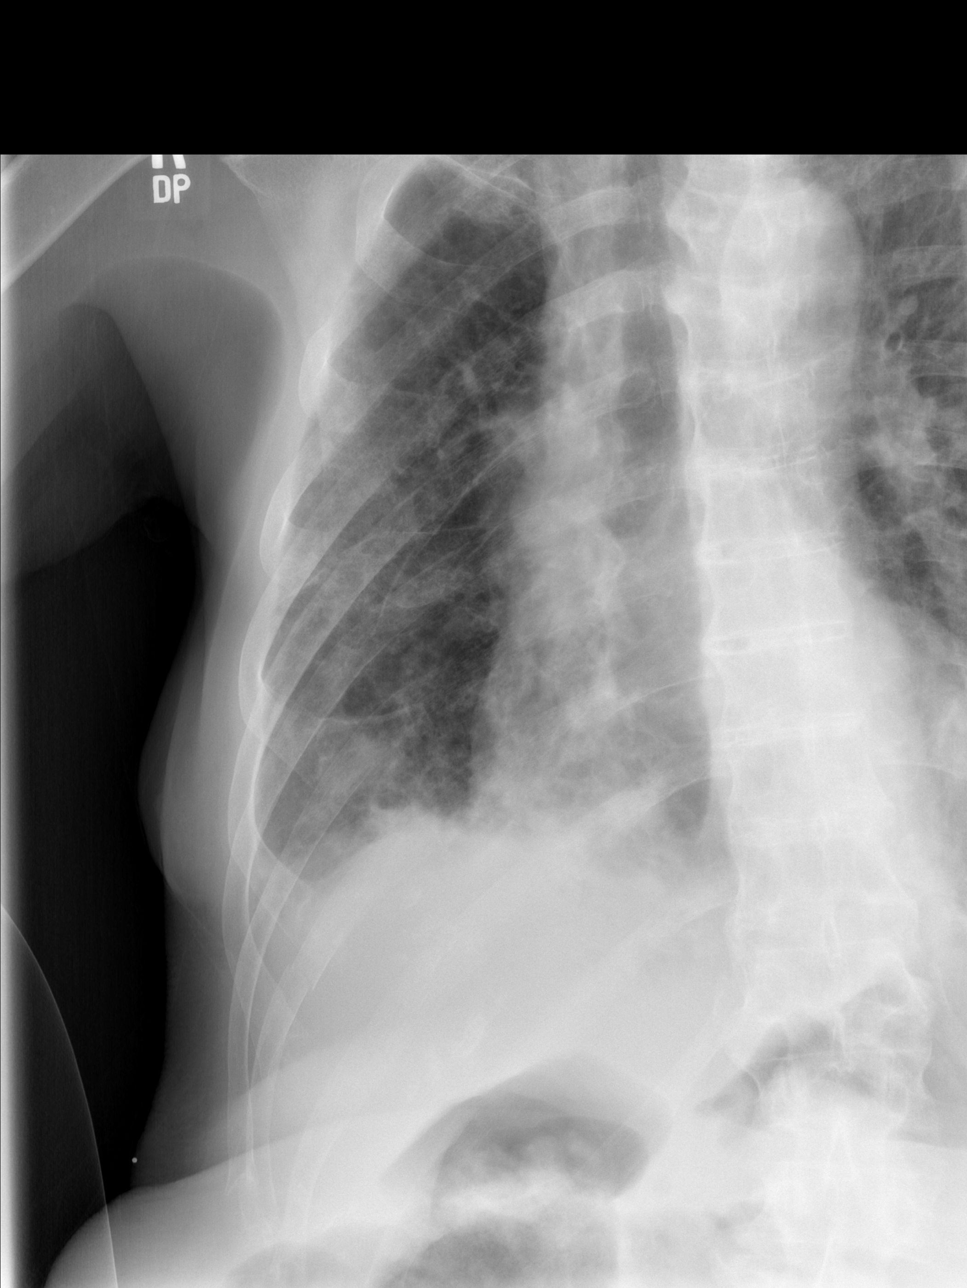

[3 of 3 positions shown; findings below may reference images not displayed]

FINDINGS: Minimal enlargement of cardiac silhouette.

Slight pulmonary vascular congestion.

Mediastinal contours normal.

Questionable LEFT suprahilar nodular density 16 x 13 mm diameter.

RIGHT basilar atelectasis and small pleural effusion.

Chronic interstitial disease in the mid to lower lungs bilaterally
asymmetrically greater on RIGHT.

No acute infiltrate or pneumothorax.

Bones appear demineralized.

Dextroconvex thoracic scoliosis.

BB placed at site of symptoms lower RIGHT chest.

Minimally displaced fractures of the RIGHT ninth and tenth ribs
noted.
IMPRESSION: Minimally displaced fractures of the RIGHT ninth and tenth ribs with
associated RIGHT pleural effusion and basilar atelectasis.

Underlying chronic interstitial lung disease changes.

Question new 16 x 13 mm LEFT suprahilar pulmonary nodule; CT chest
recommended to exclude pulmonary neoplasm.

These results will be called to the ordering clinician or
representative by the Radiologist Assistant, and communication
documented in the PACS or zVision Dashboard.
